# Patient Record
Sex: Male | Born: 1940
Health system: Southern US, Community
[De-identification: ages and names within clinical notes are randomized; demographics above are authoritative.]

## PROBLEM LIST (undated history)

## (undated) DIAGNOSIS — J449 Chronic obstructive pulmonary disease, unspecified: Secondary | ICD-10-CM

## (undated) DIAGNOSIS — M1990 Unspecified osteoarthritis, unspecified site: Secondary | ICD-10-CM

## (undated) DIAGNOSIS — Z8546 Personal history of malignant neoplasm of prostate: Secondary | ICD-10-CM

## (undated) DIAGNOSIS — H269 Unspecified cataract: Secondary | ICD-10-CM

## (undated) DIAGNOSIS — R350 Frequency of micturition: Secondary | ICD-10-CM

## (undated) DIAGNOSIS — Z87898 Personal history of other specified conditions: Secondary | ICD-10-CM

## (undated) DIAGNOSIS — C61 Malignant neoplasm of prostate: Secondary | ICD-10-CM

## (undated) DIAGNOSIS — T8859XA Other complications of anesthesia, initial encounter: Secondary | ICD-10-CM

## (undated) DIAGNOSIS — Z9889 Other specified postprocedural states: Secondary | ICD-10-CM

## (undated) DIAGNOSIS — R351 Nocturia: Secondary | ICD-10-CM

## (undated) DIAGNOSIS — N320 Bladder-neck obstruction: Secondary | ICD-10-CM

## (undated) DIAGNOSIS — T4145XA Adverse effect of unspecified anesthetic, initial encounter: Secondary | ICD-10-CM

## (undated) DIAGNOSIS — N486 Induration penis plastica: Secondary | ICD-10-CM

## (undated) DIAGNOSIS — R112 Nausea with vomiting, unspecified: Secondary | ICD-10-CM

## (undated) DIAGNOSIS — G4733 Obstructive sleep apnea (adult) (pediatric): Secondary | ICD-10-CM

## (undated) DIAGNOSIS — Z87448 Personal history of other diseases of urinary system: Secondary | ICD-10-CM

## (undated) DIAGNOSIS — I10 Essential (primary) hypertension: Secondary | ICD-10-CM

## (undated) HISTORY — PX: EYE SURGERY: SHX253

## (undated) HISTORY — DX: Malignant neoplasm of prostate: C61

## (undated) HISTORY — DX: Essential (primary) hypertension: I10

## (undated) HISTORY — DX: Unspecified cataract: H26.9

## (undated) HISTORY — PX: CATARACT EXTRACTION W/ INTRAOCULAR LENS  IMPLANT, BILATERAL: SHX1307

## (undated) HISTORY — DX: Unspecified osteoarthritis, unspecified site: M19.90

---

## 2001-10-03 HISTORY — PX: OTHER SURGICAL HISTORY: SHX169

## 2002-02-19 ENCOUNTER — Ambulatory Visit (HOSPITAL_COMMUNITY): Admission: RE | Admit: 2002-02-19 | Discharge: 2002-02-19 | Payer: Self-pay | Admitting: Urology

## 2002-03-29 ENCOUNTER — Encounter: Admission: RE | Admit: 2002-03-29 | Discharge: 2002-03-29 | Payer: Self-pay | Admitting: Urology

## 2002-03-29 ENCOUNTER — Encounter: Payer: Self-pay | Admitting: Urology

## 2002-04-09 ENCOUNTER — Ambulatory Visit: Admission: RE | Admit: 2002-04-09 | Discharge: 2002-07-30 | Payer: Self-pay | Admitting: *Deleted

## 2002-05-21 ENCOUNTER — Encounter: Payer: Self-pay | Admitting: Urology

## 2002-05-21 ENCOUNTER — Encounter: Admission: RE | Admit: 2002-05-21 | Discharge: 2002-05-21 | Payer: Self-pay | Admitting: Urology

## 2002-06-20 ENCOUNTER — Ambulatory Visit (HOSPITAL_BASED_OUTPATIENT_CLINIC_OR_DEPARTMENT_OTHER): Admission: RE | Admit: 2002-06-20 | Discharge: 2002-06-20 | Payer: Self-pay | Admitting: Urology

## 2002-06-20 ENCOUNTER — Encounter: Payer: Self-pay | Admitting: Urology

## 2002-06-20 HISTORY — PX: OTHER SURGICAL HISTORY: SHX169

## 2002-07-10 ENCOUNTER — Ambulatory Visit: Admission: RE | Admit: 2002-07-10 | Discharge: 2002-07-30 | Payer: Self-pay | Admitting: *Deleted

## 2004-03-10 ENCOUNTER — Ambulatory Visit (HOSPITAL_COMMUNITY): Admission: RE | Admit: 2004-03-10 | Discharge: 2004-03-10 | Payer: Self-pay | Admitting: Urology

## 2008-04-21 ENCOUNTER — Ambulatory Visit (HOSPITAL_COMMUNITY): Admission: EM | Admit: 2008-04-21 | Discharge: 2008-04-21 | Payer: Self-pay | Admitting: Emergency Medicine

## 2008-04-21 HISTORY — PX: OTHER SURGICAL HISTORY: SHX169

## 2010-10-28 ENCOUNTER — Ambulatory Visit
Admission: RE | Admit: 2010-10-28 | Discharge: 2010-10-28 | Payer: Self-pay | Source: Home / Self Care | Attending: Urology | Admitting: Urology

## 2010-10-28 HISTORY — PX: OTHER SURGICAL HISTORY: SHX169

## 2010-10-28 LAB — POCT HEMOGLOBIN-HEMACUE: Hemoglobin: 14.1 g/dL (ref 13.0–17.0)

## 2010-11-29 NOTE — Op Note (Signed)
  NAME:  Steve [name], Steve [name]                ACCOUNT NO.:  [id number]  MEDICAL RECORD NO.:  [id number]          PATIENT TYPE:  AMB  LOCATION:  N[facility]                         FACILITY:  [facility]  PHYSICIAN:  Kamani Lewter I. Patsi Sears, M.D.DATE OF BIRTH:  Nov 23, 1940  DATE OF PROCEDURE: DATE OF DISCHARGE:                              OPERATIVE REPORT   FINAL DIAGNOSES: 1. Dense urethral stricture disease. 2. Adenocarcinoma of the prostate post iodine seed implantation.  OPERATIONS:  Cystourethroscopy, urethral dilation, balloon dilation of the urethra with Cook balloon dilator, Gyrus vaporization of prostate and stricture.  SURGEON:  Mercy Malena I. Patsi Sears, M.D.  ANESTHESIA:  General LMA.  PREPARATION:  After appropriate preanesthesia, the patient was brought to the operating room, placed on the operating room in dorsal supine position where general LMA anesthesia was introduced.  He was then replaced in dorsal lithotomy position where the pubis was prepped with Betadine solution and draped in usual fashion.  REVIEW OF HISTORY:  This 70 year old male is status post prostate seed implantation for adenocarcinoma of the prostate in 2003.  He had bladder neck contracture release by Dr. Vonita Moss in 2009.  The patient now notes that he has a very slow urinary stream and there is no force of the stream.  The patient had cystoscopy in the office, but dense stricture was identified and could not be dilated in the office.  He is now for dilation.  DESCRIPTION OF PROCEDURE:  Cystourethroscopy reveals a tight narrowing of the prostatic urethra.  There appears to be complete scar of the urethra, but a small dimple was identified.  I have probed this dimple with a floppy-tip wire but could not get the wire to go into the bladder.  Retrograde urethrogram was then accomplished and shows the bladder and some normalcy of proximal urethra.  Therefore, I dilated the urethra with the [employer] sounds from a size  12-French to a size 18- [place], re-cystoscoped the patient, repassed the guidewire into the bladder.  I was then able to dilate the patient to a size 26-French and then passed the Gyrus scope into the bladder with difficulty.  The patient had several areas of scar and these were vaporized with the Gyrus button electrode.  This also required repeat vaporization of the patient's bladder neck, on the right side, from the 7 o'clock to the 5 o'clock positions.  Following this, no bleeding was noted.  A guidewire was passed in the bladder, and a size 20 Council catheter was passed into the bladder and 10 mL were placed in the balloon.  The patient was awakened and taken to recovery room in good condition.     [name] I. Patsi Sears, M.D.     SIT/MEDQ  D:  10/28/2010  T:  10/28/2010  Job:  [id number]  Electronically Signed by Jethro Bolus M.D. on 11/29/2010 09:33:01 AM

## 2011-02-15 NOTE — Consult Note (Signed)
NAMECABOT, CROMARTIE NO.:  [id number]   MEDICAL RECORD NO.:  [id number]          PATIENT TYPE:  INP   LOCATION:  0113                         FACILITY:  [facility]   PHYSICIAN:  Maretta Bees. Vonita Moss, M.D.DATE OF BIRTH:  1940-11-27   DATE OF CONSULTATION:  04/21/2008  DATE OF DISCHARGE:                                 CONSULTATION   I was asked to see this 70 year old white male by the  [facility]  Emergency Room staff when he presented there in urinary retention, and  the staff could not put and a Foley catheter and they even tried a 14-  [place] Coude.  He has a history of a weak stream.  He has had a history  of radioactive seed implantation of the prostate by Dr. Patsi Sears in  2003.  His PSA is a bit low.   He also has been treated by Dr. Patsi Sears for osteopenia with vitamin D  and Actonel.   PAST HISTORY:  He is in generally good health.  He denies strokes, heart  attacks, or other serious illnesses such as diabetes.   MEDICATIONS:  1. Actonel.  2. High doses of vitamin D   ALLERGIES:  None.   TOBACCO:  None.   ALCOHOL:  None.   FAMILY HISTORY:  Noncontributory.   REVIEW OF SYSTEMS:  No headache, dizziness, chills, fever, chest pain,  or cough.  He has lower abdominal pain from inability to void.  He  denies any peripheral edema.   PHYSICAL EXAMINATION:  VITAL SIGNS:  As recorded at 146/85 blood  pressure, with a pulse of 70, temperature 98.5.  GENERAL:  He is alert and oriented.  Skin is warm and dry.  He is in  distress.  HEART:  Tones regular.  CHEST:  Clear.  ABDOMEN:  Reveals no hernias or hepatosplenomegaly, but he has a  distended, tender suprapubic region consistent with urinary retention.  GU:  Penis, urethral meatus, testicles, and epididymis remarkable for  some blood per urethral meatus.  Perineum is normal.  RECTAL:  Anal sphincter tone is normal.  Prostate feels small.   I attempted to pass a Foley catheter, but it would not go very  easily,  and I tried to pass a guidewire, but again it did not go easily, and he  had enough bleeding from his prior instrumentation that I felt that it  would be best to take him to the operating room for further evaluation,  since he had been n.p.o. and I was concerned about good visualization  with blood per urethra and trying and do that in the ED.   IMPRESSION:  1. Urinary retention.  2. Urethral stricture versus bladder neck contracture.  3. Osteopenia.      Maretta Bees. Vonita Moss, M.D.  Electronically Signed     LJP/MEDQ  D:  04/21/2008  T:  04/22/2008  Job:  [id number]

## 2011-02-15 NOTE — Op Note (Signed)
NAMECALVYN, KURTZMAN NO.:  [id number]   MEDICAL RECORD NO.:  [id number]          PATIENT TYPE:  INP   LOCATION:  0113                         FACILITY:  [facility]   PHYSICIAN:  Maretta Bees. Vonita Moss, M.D.DATE OF BIRTH:  1941/01/07   DATE OF PROCEDURE:  04/21/2008  DATE OF DISCHARGE:                               OPERATIVE REPORT   PREOPERATIVE DIAGNOSIS:  Urinary retention due to urethral stricture or  bladder neck contracture.   POSTOPERATIVE DIAGNOSIS:  Urinary retention due to tight, deep, bulbous  urethral stricture.   PROCEDURE:  Cystoscopy, urethral dilation with filiforms  and followers  and placement of Foley catheter.   SURGEON:  Dr. Larey Dresser.   ANESTHESIA:  General.   INDICATIONS:  This gentleman presented to the emergency room with  urinary retention and a catheter could not be passed.  As noted in my  H&P, I brought him to the OR for further evaluation.   PROCEDURE:  The patient brought to the operating room, placed in  lithotomy position.  The external genitalia were prepped and draped in  usual fashion.  The anterior urethra was normal except for a very tiny  opening through a deep bulbous urethral stricture.  A flexible guidewire  was placed through this opening and passed easily into the bladder.  Over this guidewire I dilated a fairly filmy deep bulbous urethral  stricture to 28-French.  I then reinserted the cystoscope and verified  the presence of a deep bulbous urethral stricture.  The prostate was  small, short and really nonobstructing with an open bladder neck.  The  bladder itself had no lesions.  The bladder having been drained of his  retained urine, was then drained further with placement of a 20-French 5  mL Foley.  The patient tolerated procedure well and was sent to the  recovery room in good condition with negligible blood loss and Foley  catheter draining nicely.   I will send him home tonight on Cipro and have him remove  his Foley  catheter in the morning, return to see Dr. Patsi Sears next week in  follow-up or call us earlier p.r.n.      Maretta Bees. Vonita Moss, M.D.  Electronically Signed     LJP/MEDQ  D:  04/21/2008  T:  04/22/2008  Job:  [id number]

## 2011-02-18 NOTE — Op Note (Signed)
[facility]  Patient:    AITHAN, FARRELLY Visit Number: [id number] MRN: [id number]          Service Type: Attending:  Vonzell Schlatter. Patsi Sears, M.D. Dictated by:   Vonzell Schlatter Patsi Sears, M.D. Proc. Date: 02/19/02                             Operative Report  PREOPERATIVE DIAGNOSIS:  Erectile dysfunction.  POSTOPERATIVE DIAGNOSIS:  Erectile dysfunction.  PROCEDURE:  Duplex Doppler ultrasonography with prostaglandin injection.  SURGEON:  Sigmund I. Patsi Sears, M.D.  DESCRIPTION OF PROCEDURE:  With the patient in the supine position, resting preinjection cavernosal arterial flow was measured.  The patient has a 49.3 cc/sec. flow in the right proximal cavernosal artery, with an end-diastolic flow of 2.4 cm/sec.  He has a resting preinjection flow of 23.4 cm/sec. in the left peak systolic flow and an end-diastolic flow of 1.2 cm/sec.  Distalward, the patient has a 32.2 cm/sec. peak systolic flow and a 3.7 cm/sec. end-diastolic flow on the right, and a 15 cm/sec. distal left flow and a 2.2 cm/sec. end-diastolic flow.  Following the injection, the patient increases the proximal right flow to 127.4 cm/sec. with a 7.5 cm end-diastolic flow, at the same time has a dsital gradient, with a 65.2 cm/sec. flow and a 25.5 cm/sec. end-diastolic flow (venous leak).  The patient continues to have this enormous gradient on the right side secondary to Peyronies disease for the entire study, and at 30 minutes has a peak systolic flow on the right side of 166 cm/sec. and a distal end-diastolic flow of 10 cc/sec. and distal flow of 78 cm/sec. with an end-diastolic flow of 2.5 cm/sec.  On the left side, the patient increases his peak systolic flow from the 23.9 cm/sec. peak flow, with a distal flow of 15 cc/sec. peak systolic, and after 1 cc prostaglandin injection 20 mcg/cc., the patient increases his peak systolic flow on the left to a maximum of 57.9 cm/sec., with an  end-diastolic flow of 3.2 cm/sec., and a distal gradient with a 37.5 cc/sec. flow and a 15 cc/sec. end-diastolic flow.  Interestingly, at 30 minutes the patient has a 54 cm/sec. peak diastolic flow on the left proximally and an 82 cm/sec. peak systolic flow distalward.  This would indicate that the patient is achieving blood flow from the right side across to the left side distalward.  IMPRESSION:  The patient has significant Perynonies plaque, which is easily palpated.  He complains of severe penile curvature.  Duplex Doppler ultrasound scanning shows a large gradient, even at rest without injection but the gradient is vastly demonstrated with 1 cc of prostaglandin injection. Dictated by:   Vonzell Schlatter Patsi Sears, M.D. Attending:  Vonzell Schlatter. Patsi Sears, M.D. DD:  02/19/02 TD:  02/21/02 Job: [id number]

## 2011-02-18 NOTE — Op Note (Signed)
   NAME:  Steve [name], Steve [name]                            ACCOUNT NO.:  [id number]   MEDICAL RECORD NO.:  [id number]                   PATIENT TYPE:  AMB   LOCATION:  [facility]C                                 FACILITY:  [facility]   PHYSICIAN:  Sigmund I. Patsi Sears, M.D.         DATE OF BIRTH:  [birth year]   DATE OF PROCEDURE:  06/20/2002  DATE OF DISCHARGE:                                 OPERATIVE REPORT   PREOPERATIVE DIAGNOSES:  Adenocarcinoma of the prostate.   POSTOPERATIVE DIAGNOSES:  Adenocarcinoma of the prostate.   OPERATION:  Radioactive iodine seed implantation (67 seeds).   PREPARATION:  After appropriate preanesthesia, the patient was brought to  the operating room, placed on the operating table in dorsal supine position  where general LMA anesthesia was introduced. He was replaced in dorsal  lithotomy position where the pubis was prepped with Betadine solution and  draped in the usual fashion.   REVIEW OF HISTORY:  This 70 year old married white male, was originally  evaluated for erectile dysfunction of one year duration, secondary to the  Peyronie's disease. He was found to have atrophic left testicle secondary to  mumps orchitis in the past. His PSA was 5.1, and prostate and rectal  examination showed a 3+ lobular benign prostate. His prostate volume was 32  cc, and prostate biopsy showed Gleason 3+4 (7) adenocarcinoma of the  prostate. He had a negative CT and bone scan, selective radioactive seed  implantation is treatment for his prostate cancer.   DESCRIPTION OF PROCEDURE:  The patient undergoes insertion of I-125 seeds  under fluoroscopic control with Dr. Jackelyn Knife, with total number of 67 seeds  implanted. He did quite well, flexible cystoscopy shows normal appearing  bladder, and normal appearing urethra, with some mild erythema of the  urethra. The veru was normal and  the bladder neck was normal. The cystoscope was withdrawn and #16 Foley  catheter placed. The patient  tolerated the procedure well and was given a  B&O suppository at the end of the case, and given 30 mg of IV Toradol,  awakened and taken to the recovery room in good condition.                                               Sigmund I. Patsi Sears, M.D.    SIT/MEDQ  D:  06/20/2002  T:  06/20/2002  Job:  [id number]   cc:   Elmer Sow. Dorna Bloom, M.D.  [address] [facility]  [place]  [place]  [address]  Fax: [phone]   Donzetta Sprung  [address], [address]  [place]  [place] [address]  Fax: [phone]

## 2011-07-01 LAB — DIFFERENTIAL
Basophils Absolute: 0
Basophils Relative: 0
Eosinophils Absolute: 0
Eosinophils Relative: 0
Lymphocytes Relative: 5 — ABNORMAL LOW
Lymphs Abs: 0.5 — ABNORMAL LOW
Monocytes Absolute: 0.4
Monocytes Relative: 4
Neutro Abs: 8.9 — ABNORMAL HIGH
Neutrophils Relative %: 90 — ABNORMAL HIGH

## 2011-07-01 LAB — COMPREHENSIVE METABOLIC PANEL WITH GFR
BUN: 21
CO2: 25
Calcium: 9.1
GFR calc non Af Amer: 50 — ABNORMAL LOW
Glucose, Bld: 112 — ABNORMAL HIGH
Total Protein: 6.6

## 2011-07-01 LAB — POCT I-STAT, CHEM 8
BUN: 23
Calcium, Ion: 1.19
Chloride: 108
Creatinine, Ser: 1.5
Glucose, Bld: 104 — ABNORMAL HIGH
HCT: 38 — ABNORMAL LOW
Hemoglobin: 12.9 — ABNORMAL LOW
Potassium: 3.9
Sodium: 140
TCO2: 24

## 2011-07-01 LAB — CBC
HCT: 39
Hemoglobin: 13.6
MCHC: 34.8
MCV: 88.6
Platelets: 146 — ABNORMAL LOW
RBC: 4.4
RDW: 13.5
WBC: 9.8

## 2011-07-01 LAB — COMPREHENSIVE METABOLIC PANEL
ALT: 14
AST: 19
Albumin: 3.5
Alkaline Phosphatase: 100
Chloride: 109
Creatinine, Ser: 1.4
GFR calc Af Amer: >60
Potassium: 3.9
Sodium: 141
Total Bilirubin: 1

## 2012-01-16 DIAGNOSIS — C61 Malignant neoplasm of prostate: Secondary | ICD-10-CM | POA: Diagnosis not present

## 2012-01-16 DIAGNOSIS — N320 Bladder-neck obstruction: Secondary | ICD-10-CM | POA: Diagnosis not present

## 2012-01-17 ENCOUNTER — Other Ambulatory Visit: Payer: Self-pay | Admitting: Urology

## 2012-01-24 ENCOUNTER — Encounter (HOSPITAL_BASED_OUTPATIENT_CLINIC_OR_DEPARTMENT_OTHER): Payer: Self-pay | Admitting: *Deleted

## 2012-01-25 ENCOUNTER — Encounter (HOSPITAL_BASED_OUTPATIENT_CLINIC_OR_DEPARTMENT_OTHER): Payer: Self-pay | Admitting: *Deleted

## 2012-01-25 NOTE — Progress Notes (Signed)
NPO AFTER MN. ARRIVES AT 0845. NEEDS HG AND EKG. LAST CXR W/ CHART.

## 2012-01-27 ENCOUNTER — Encounter (HOSPITAL_BASED_OUTPATIENT_CLINIC_OR_DEPARTMENT_OTHER)
Admission: RE | Disposition: A | Discharge status: Home / Self Care | Payer: Self-pay | Source: Ambulatory Visit | Attending: Urology

## 2012-01-27 ENCOUNTER — Encounter (HOSPITAL_BASED_OUTPATIENT_CLINIC_OR_DEPARTMENT_OTHER): Payer: Self-pay | Admitting: Anesthesiology

## 2012-01-27 ENCOUNTER — Ambulatory Visit (HOSPITAL_BASED_OUTPATIENT_CLINIC_OR_DEPARTMENT_OTHER): Payer: Medicare Other | Admitting: Anesthesiology

## 2012-01-27 ENCOUNTER — Encounter (HOSPITAL_BASED_OUTPATIENT_CLINIC_OR_DEPARTMENT_OTHER): Payer: Self-pay

## 2012-01-27 ENCOUNTER — Ambulatory Visit (HOSPITAL_BASED_OUTPATIENT_CLINIC_OR_DEPARTMENT_OTHER)
Admission: RE | Admit: 2012-01-27 | Discharge: 2012-01-27 | Disposition: A | Discharge status: Home / Self Care | Payer: Medicare Other | Source: Ambulatory Visit | Attending: Urology | Admitting: Urology

## 2012-01-27 DIAGNOSIS — N35919 Unspecified urethral stricture, male, unspecified site: Secondary | ICD-10-CM | POA: Insufficient documentation

## 2012-01-27 DIAGNOSIS — E559 Vitamin D deficiency, unspecified: Secondary | ICD-10-CM | POA: Diagnosis not present

## 2012-01-27 DIAGNOSIS — G4730 Sleep apnea, unspecified: Secondary | ICD-10-CM | POA: Diagnosis not present

## 2012-01-27 DIAGNOSIS — N200 Calculus of kidney: Secondary | ICD-10-CM | POA: Diagnosis not present

## 2012-01-27 DIAGNOSIS — N320 Bladder-neck obstruction: Secondary | ICD-10-CM | POA: Diagnosis not present

## 2012-01-27 DIAGNOSIS — C61 Malignant neoplasm of prostate: Secondary | ICD-10-CM | POA: Insufficient documentation

## 2012-01-27 DIAGNOSIS — M899 Disorder of bone, unspecified: Secondary | ICD-10-CM | POA: Insufficient documentation

## 2012-01-27 DIAGNOSIS — R351 Nocturia: Secondary | ICD-10-CM | POA: Insufficient documentation

## 2012-01-27 HISTORY — DX: Induration penis plastica: N48.6

## 2012-01-27 HISTORY — DX: Personal history of other specified conditions: Z87.898

## 2012-01-27 HISTORY — DX: Obstructive sleep apnea (adult) (pediatric): G47.33

## 2012-01-27 HISTORY — PX: CYSTOSCOPY: SHX5120

## 2012-01-27 HISTORY — DX: Personal history of malignant neoplasm of prostate: Z85.46

## 2012-01-27 HISTORY — DX: Bladder-neck obstruction: N32.0

## 2012-01-27 HISTORY — DX: Nocturia: R35.1

## 2012-01-27 HISTORY — DX: Frequency of micturition: R35.0

## 2012-01-27 HISTORY — DX: Chronic obstructive pulmonary disease, unspecified: J44.9

## 2012-01-27 HISTORY — DX: Personal history of other diseases of urinary system: Z87.448

## 2012-01-27 SURGERY — CYSTOSCOPY
Anesthesia: General | Site: Bladder | Wound class: Clean Contaminated

## 2012-01-27 MED ORDER — HYDROCODONE-ACETAMINOPHEN 7.5-650 MG PO TABS: 1.0000 | ORAL_TABLET | Freq: Four times a day (QID) | ORAL | Status: AC | PRN

## 2012-01-27 MED ORDER — MIDAZOLAM HCL 5 MG/5ML IJ SOLN
INTRAMUSCULAR | Status: DC | PRN
  Administered 2012-01-27: 2 mg via INTRAVENOUS

## 2012-01-27 MED ORDER — DEXAMETHASONE SODIUM PHOSPHATE 4 MG/ML IJ SOLN
INTRAMUSCULAR | Status: DC | PRN
  Administered 2012-01-27: 4 mg via INTRAVENOUS

## 2012-01-27 MED ORDER — TRIMETHOPRIM 100 MG PO TABS: 100.0000 mg | ORAL_TABLET | ORAL | Status: AC

## 2012-01-27 MED ORDER — PHENAZOPYRIDINE HCL 200 MG PO TABS: 200.0000 mg | ORAL_TABLET | Freq: Three times a day (TID) | ORAL | Status: AC | PRN

## 2012-01-27 MED ORDER — PROMETHAZINE HCL 25 MG/ML IJ SOLN: 6.2500 mg | INTRAMUSCULAR | Status: DC | PRN

## 2012-01-27 MED ORDER — CEFAZOLIN SODIUM 1-5 GM-% IV SOLN
1.0000 g | INTRAVENOUS | Status: AC
  Administered 2012-01-27: 1 g via INTRAVENOUS

## 2012-01-27 MED ORDER — FENTANYL CITRATE 0.05 MG/ML IJ SOLN: 25.0000 ug | INTRAMUSCULAR | Status: DC | PRN

## 2012-01-27 MED ORDER — ONDANSETRON HCL 4 MG/2ML IJ SOLN
INTRAMUSCULAR | Status: DC | PRN
  Administered 2012-01-27: 4 mg via INTRAVENOUS

## 2012-01-27 MED ORDER — LACTATED RINGERS IV SOLN: INTRAVENOUS | Status: DC

## 2012-01-27 MED ORDER — PROPOFOL 10 MG/ML IV EMUL
INTRAVENOUS | Status: DC | PRN
  Administered 2012-01-27: 180 mg via INTRAVENOUS

## 2012-01-27 MED ORDER — LIDOCAINE HCL (CARDIAC) 20 MG/ML IV SOLN
INTRAVENOUS | Status: DC | PRN
  Administered 2012-01-27: 60 mg via INTRAVENOUS

## 2012-01-27 MED ORDER — LACTATED RINGERS IV SOLN
INTRAVENOUS | Status: DC
  Administered 2012-01-27 (×2): via INTRAVENOUS

## 2012-01-27 MED ORDER — FENTANYL CITRATE 0.05 MG/ML IJ SOLN
INTRAMUSCULAR | Status: DC | PRN
  Administered 2012-01-27 (×3): 50 ug via INTRAVENOUS

## 2012-01-27 MED ORDER — BELLADONNA ALKALOIDS-OPIUM 16.2-60 MG RE SUPP
RECTAL | Status: DC | PRN
  Administered 2012-01-27: 1 via RECTAL

## 2012-01-27 SURGICAL SUPPLY — 28 items
BAG DRAIN URO-CYSTO SKYTR STRL (DRAIN) ×2 IMPLANT
BAG DRN UROCATH (DRAIN) ×1
BAG URINE LEG 500ML (DRAIN) ×1 IMPLANT
BALLN NEPHROSTOMY (BALLOONS) ×2
BALLOON NEPHROSTOMY (BALLOONS) IMPLANT
BOOTIES KNEE HIGH SLOAN (MISCELLANEOUS) ×1 IMPLANT
CANISTER SUCT LVC 12 LTR MEDI- (MISCELLANEOUS) ×1 IMPLANT
CATH FOLEY 2WAY SLVR 30CC 22FR (CATHETERS) ×1 IMPLANT
CLOTH BEACON ORANGE TIMEOUT ST (SAFETY) ×2 IMPLANT
DRAPE CAMERA CLOSED 9X96 (DRAPES) ×2 IMPLANT
ELECT REM PT RETURN 9FT ADLT (ELECTROSURGICAL) ×2
ELECT RESECT VAPORIZE 12D CBL (ELECTRODE) ×1 IMPLANT
ELECTRODE REM PT RTRN 9FT ADLT (ELECTROSURGICAL) ×1 IMPLANT
GLOVE BIO SURGEON STRL SZ7.5 (GLOVE) ×2 IMPLANT
GOWN PREVENTION PLUS LG XLONG (DISPOSABLE) ×2 IMPLANT
GOWN STRL REIN XL XLG (GOWN DISPOSABLE) ×2 IMPLANT
GUIDEWIRE STR DUAL SENSOR (WIRE) ×1 IMPLANT
IV NS IRRIG 3000ML ARTHROMATIC (IV SOLUTION) ×2 IMPLANT
KIT ASPIRATION TUBING (SET/KITS/TRAYS/PACK) ×1 IMPLANT
NDL SAFETY ECLIPSE 18X1.5 (NEEDLE) IMPLANT
NDL SPNL 22GX7 QUINCKE BK (NEEDLE) IMPLANT
NEEDLE HYPO 18GX1.5 SHARP (NEEDLE)
NEEDLE HYPO 22GX1.5 SAFETY (NEEDLE) IMPLANT
NEEDLE SPNL 22GX7 QUINCKE BK (NEEDLE) IMPLANT
NS IRRIG 500ML POUR BTL (IV SOLUTION) ×1 IMPLANT
PACK CYSTOSCOPY (CUSTOM PROCEDURE TRAY) ×2 IMPLANT
SYR 20CC LL (SYRINGE) IMPLANT
SYRINGE IRR TOOMEY STRL 70CC (SYRINGE) ×1 IMPLANT

## 2012-01-27 NOTE — Transfer of Care (Signed)
Immediate Anesthesia Transfer of Care Note  Patient: Steve [name]  Procedure(s) Performed: Procedure(s) (LRB): CYSTOSCOPY (N/A)  Patient Location: Patient transported to PACU with oxygen via face mask at 4 Liters / Min  Anesthesia Type: General  Level of Consciousness: awake and alert   Airway & Oxygen Therapy: Patient Spontanous Breathing and Patient connected to face mask oxygen  Post-op Assessment: Report given to PACU RN and Post -op Vital signs reviewed and stable  Post vital signs: Reviewed and stable  Dentition: Teeth and oropharynx remain in pre-op condition  Complications: No apparent anesthesia complications

## 2012-01-27 NOTE — Anesthesia Postprocedure Evaluation (Signed)
Anesthesia Post Note  Patient: Steve [name]  Procedure(s) Performed: Procedure(s) (LRB): CYSTOSCOPY (N/A)  Anesthesia type: General  Patient location: PACU  Post pain: Pain level controlled  Post assessment: Post-op Vital signs reviewed  Last Vitals:  Filed Vitals:   01/27/12 1130  BP:   Pulse: 71  Temp:   Resp: 17    Post vital signs: Reviewed  Level of consciousness: sedated  Complications: No apparent anesthesia complications

## 2012-01-27 NOTE — Interval H&P Note (Signed)
History and Physical Interval Note:  01/27/2012 10:24 AM  Steve [name]  has presented today for surgery, with the diagnosis of bladder neck contracture  The various methods of treatment have been discussed with the patient and family. After consideration of risks, benefits and other options for treatment, the patient has consented to  Procedure(s) (LRB): CYSTOSCOPY (N/A) as a surgical intervention .  The patients' history has been reviewed, patient examined, no change in status, stable for surgery.  I have reviewed the patients' chart and labs.  Questions were answered to the patient's satisfaction.     Jethro Bolus I

## 2012-01-27 NOTE — Anesthesia Procedure Notes (Signed)
Procedure Name: LMA Insertion Date/Time: 01/27/2012 10:32 AM Performed by: Norva Pavlov Pre-anesthesia Checklist: Patient identified, Emergency Drugs available, Suction available and Patient being monitored Patient Re-evaluated:Patient Re-evaluated prior to inductionOxygen Delivery Method: Circle System Utilized Preoxygenation: Pre-oxygenation with 100% oxygen Intubation Type: IV induction Ventilation: Mask ventilation without difficulty LMA: LMA inserted LMA Size: 4.0 Number of attempts: 1 Airway Equipment and Method: bite block Placement Confirmation: positive ETCO2 Tube secured with: Tape Dental Injury: Teeth and Oropharynx as per pre-operative assessment

## 2012-01-27 NOTE — Discharge Instructions (Signed)
°  Post Anesthesia Home Care Instructions ° °Activity: °Get plenty of rest for the remainder of the day. A responsible adult should stay with you for 24 hours following the procedure.  °For the next 24 hours, DO NOT: °-Drive a car °-Operate machinery °-Drink alcoholic beverages °-Take any medication unless instructed by your physician °-Make any legal decisions or sign important papers. ° °Meals: °Start with liquid foods such as gelatin or soup. Progress to regular foods as tolerated. Avoid greasy, spicy, heavy foods. If nausea and/or vomiting occur, drink only clear liquids until the nausea and/or vomiting subsides. Call your physician if vomiting continues. ° °Special Instructions/Symptoms: °Your throat may feel dry or sore from the anesthesia or the breathing tube placed in your throat during surgery. If this causes discomfort, gargle with warm salt water. The discomfort should disappear within 24 hours. ° ° °Post Anesthesia Home Care Instructions ° °Activity: °Get plenty of rest for the remainder of the day. A responsible adult should stay with you for 24 hours following the procedure.  °For the next 24 hours, DO NOT: °-Drive a car °-Operate machinery °-Drink alcoholic beverages °-Take any medication unless instructed by your physician °-Make any legal decisions or sign important papers. ° °Meals: °Start with liquid foods such as gelatin or soup. Progress to regular foods as tolerated. Avoid greasy, spicy, heavy foods. If nausea and/or vomiting occur, drink only clear liquids until the nausea and/or vomiting subsides. Call your physician if vomiting continues. ° °Special Instructions/Symptoms: °Your throat may feel dry or sore from the anesthesia or the breathing tube placed in your throat during surgery. If this causes discomfort, gargle with warm salt water. The discomfort should disappear within 24 hours. ° °

## 2012-01-27 NOTE — Anesthesia Preprocedure Evaluation (Addendum)
Anesthesia Evaluation  Patient identified by MRN, date of birth, ID band Patient awake    Reviewed: Allergy & Precautions, H&P , NPO status , Patient's Chart, lab work & pertinent test results  Airway Mallampati: II TM Distance: >3 FB Neck ROM: Full    Dental  (+) Upper Dentures, Lower Dentures, Edentulous Upper, Edentulous Lower and Dental Advisory Given   Pulmonary sleep apnea (noncopliant with CPAP) , COPDformer smoker breath sounds clear to auscultation  Pulmonary exam normal       Cardiovascular negative cardio ROS  Rhythm:Regular Rate:Normal     Neuro/Psych negative neurological ROS  negative psych ROS   GI/Hepatic negative GI ROS, Neg liver ROS,   Endo/Other  negative endocrine ROSMorbid obesity  Renal/GU negative Renal ROS   History prostate CA; radioactive seed implants  negative genitourinary   Musculoskeletal negative musculoskeletal ROS (+)   Abdominal   Peds negative pediatric ROS (+)  Hematology negative hematology ROS (+)   Anesthesia Other Findings   Reproductive/Obstetrics negative OB ROS                          Anesthesia Physical Anesthesia Plan  ASA: II  Anesthesia Plan: General   Post-op Pain Management:    Induction: Intravenous  Airway Management Planned: LMA  Additional Equipment:   Intra-op Plan:   Post-operative Plan: Extubation in OR  Informed Consent: I have reviewed the patients History and Physical, chart, labs and discussed the procedure including the risks, benefits and alternatives for the proposed anesthesia with the patient or authorized representative who has indicated his/her understanding and acceptance.   Dental advisory given  Plan Discussed with: CRNA  Anesthesia Plan Comments:         Anesthesia Quick Evaluation

## 2012-01-27 NOTE — Op Note (Signed)
Pre-operative diagnosis : Prostate cancer with  bladder neck obstruction  Postoperative diagnosis:same  Operation: Cook basloon dilation of BNC, Gyrus TURP and removal of extruded prostate iodine seeds  Surgeon:  S. Patsi Sears, MD  First assistant: none  Anesthesia:  LMA  Preparation:After appropriate pre-anesthesia, the pt was brought to the OR and placed upon the OR table in the supine position, where General LMA anesthesia was introduced. Armband was re-checked. He was then placed in the dorsal lithotomy position and prepped in the usual fashion.   Review history:  71 yo male with hx of CaP, 2003, treated with I-125 seed Rx, and EBRT, post bladder neck contracture release 2009, and again in 2012, now with recurrent BNC for release today.   Statement of  Likelihood of Success: Excellent. TIME-OUT observed.:  Procedure: Cystoscopy reveals normal glans, and normal pendulous urethra. veru is identified. Bladder neck/prostatic urethra stricture is identified, and an 0.038 guidewire is passed through the stricture into the bladder under fluoroscopic control. The [facility] balloon dilator was the passed and , under fluoroscopic control, the bladder neck was dilated to 18 atm. For 3 minutes.    Following this, the 61F resectoscope was passed, and Gyrus vaporization of the bladder neck and proximal prostate was accomplished. No bleeding was noted, but 2 old seeds were removed and sent to radiation therapy for verification. A 31F, catheter with 30cc balloon  Was placed and the pt was awakened and taken to the recovery room in good condition.

## 2012-01-27 NOTE — H&P (Signed)
. Adenocarcinoma Of The Prostate Gland 185 2. Bladder Neck Contracture 596.0 3. Nephrolithiasis 592.0 4. Nocturia 788.43 5. Osteopenia 733.90 6. Urethral Stricture 598.9 7. Vitamin D Deficiency 268.9  History of Present Illness      71 yo married male returns today for treatment  of BNC.Marland Kitchen  Hx of prostate cancer, BNC and kidney stones.        Patient is s/p cystoscopy/Cook balloon dilation/Gyrus vaportization of prostate and stricture on 10/28/10.  He complains today that he has no sensation during the day of when he needs to void & has stress incontinence with positional changes.  He has a hx of CaP, post seed implant, 2003 wit EBRT; and bladder neck contracture (released by Dr. Vonita Moss in July 2009).  Hx of osteopenia, but no longer taking Actonel due to cost.  He is no longer taking Vit D supplement either.  07/05/11  PSA - 0.01       09/15/10 labs:  PSA - 0.02 and Vit D - 42.   Past Medical History Problems  1. History of  Adult Sleep Apnea 780.57  Surgical History Problems  1. History of  Cystoscopy For Urethral Stricture 2. History of  Cystoscopy With Fulguration 3. History of  Prostate Surgery  Current Meds 1. No Reported Medications  Allergies Medication  1. No Known Drug Allergies  Family History Problems  1. Family history of  Family Health Status Number Of Children 2 sons; 2 daughters  Social History Problems  1. Caffeine Use tea 2. Family history of  Death In The Family Father 33; lung cancer 3. Marital History - Currently Married 4. Occupation: retired 5. Tobacco Use V15.82 1 1/2 pack X 20 yrs. Denied  6. History of  Alcohol Use  Review of Systems Genitourinary, constitutional, skin, eye, otolaryngeal, hematologic/lymphatic, cardiovascular, pulmonary, endocrine, musculoskeletal, gastrointestinal, neurological and psychiatric system(s) were reviewed and pertinent findings if present are noted.  Genitourinary: incontinence.    Vitals Vital Signs  [Data Includes: Last 1 Day]  15Apr2013 02:21PM  Blood Pressure: 162 / 90 Temperature: 98.2 F Heart Rate: 67  Results/Data Urine [Data Includes: Last 1 Day]   15Apr2013  COLOR YELLOW   APPEARANCE CLEAR   SPECIFIC GRAVITY 1.025   pH 5.5   GLUCOSE NEG mg/dL  BILIRUBIN NEG   KETONE NEG mg/dL  BLOOD NEG   PROTEIN NEG mg/dL  UROBILINOGEN 0.2 mg/dL  NITRITE NEG   LEUKOCYTE ESTERASE NEG    Procedure  Procedure: Cystoscopy  Indication: Lower Urinary Tract Symptoms. Urethral Stricture. History of Urothelial Carcinoma.  Informed Consent: Risks, benefits, and potential adverse events were discussed and informed consent was obtained from the patient.  Prep: The patient was prepped with betadine.  Anesthesia:. Local anesthesia was administered intraurethrally with 2% lidocaine jelly.  Antibiotic prophylaxis: Ciprofloxacin.  Procedure Note:  Urethral meatus:. No abnormalities.  Anterior urethra: No abnormalities.  Prostatic urethra:. There was visual obstruction of the prostatic urethra. No intravesical median lobe was visualized. A bladder neck contracture was present.  Bladder: Visulization was clear. The ureteral orifices were in the normal anatomic position bilaterally and had clear efflux of urine. A systematic survey of the bladder demonstrated no bladder tumors or stones. The mucosa was smooth without abnormalities. The patient tolerated the procedure well.  Complications: None.    Assessment Assessed  1. Adenocarcinoma Of The Prostate Gland 185 2. Bladder Neck Contracture 596.0   Significant bladder neck contracture with difficulty passing scope. He will have  cysto dilation with [facility] balloon,  and vaporization of scar.   Plan Adenocarcinoma Of The Prostate Gland (185), Bladder Neck Contracture (596.0)

## 2012-01-30 ENCOUNTER — Encounter (HOSPITAL_BASED_OUTPATIENT_CLINIC_OR_DEPARTMENT_OTHER): Payer: Self-pay | Admitting: Urology

## 2012-05-08 DIAGNOSIS — C61 Malignant neoplasm of prostate: Secondary | ICD-10-CM | POA: Diagnosis not present

## 2012-05-08 DIAGNOSIS — N35919 Unspecified urethral stricture, male, unspecified site: Secondary | ICD-10-CM | POA: Diagnosis not present

## 2012-05-08 DIAGNOSIS — R32 Unspecified urinary incontinence: Secondary | ICD-10-CM | POA: Diagnosis not present

## 2012-05-08 DIAGNOSIS — N3945 Continuous leakage: Secondary | ICD-10-CM | POA: Diagnosis not present

## 2012-05-08 DIAGNOSIS — N320 Bladder-neck obstruction: Secondary | ICD-10-CM | POA: Diagnosis not present

## 2012-05-15 ENCOUNTER — Encounter (HOSPITAL_COMMUNITY): Admission: EM | Disposition: A | Discharge status: Home / Self Care | Payer: Self-pay | Source: Home / Self Care

## 2012-05-15 ENCOUNTER — Encounter (HOSPITAL_COMMUNITY): Payer: Self-pay | Admitting: Anesthesiology

## 2012-05-15 ENCOUNTER — Other Ambulatory Visit: Payer: Self-pay | Admitting: Urology

## 2012-05-15 ENCOUNTER — Ambulatory Visit (HOSPITAL_COMMUNITY)
Admission: EM | Admit: 2012-05-15 | Discharge: 2012-05-15 | Disposition: A | Discharge status: Home / Self Care | Payer: Medicare Other | Attending: Urology | Admitting: Urology

## 2012-05-15 ENCOUNTER — Encounter (HOSPITAL_COMMUNITY): Payer: Self-pay | Admitting: Emergency Medicine

## 2012-05-15 ENCOUNTER — Inpatient Hospital Stay: Admit: 2012-05-15 | Payer: Self-pay | Admitting: Urology

## 2012-05-15 ENCOUNTER — Emergency Department (HOSPITAL_COMMUNITY): Payer: Medicare Other | Admitting: Anesthesiology

## 2012-05-15 DIAGNOSIS — R339 Retention of urine, unspecified: Secondary | ICD-10-CM | POA: Diagnosis not present

## 2012-05-15 DIAGNOSIS — R351 Nocturia: Secondary | ICD-10-CM | POA: Diagnosis not present

## 2012-05-15 DIAGNOSIS — G4730 Sleep apnea, unspecified: Secondary | ICD-10-CM | POA: Diagnosis not present

## 2012-05-15 DIAGNOSIS — N320 Bladder-neck obstruction: Secondary | ICD-10-CM | POA: Diagnosis not present

## 2012-05-15 DIAGNOSIS — Z87442 Personal history of urinary calculi: Secondary | ICD-10-CM | POA: Diagnosis not present

## 2012-05-15 DIAGNOSIS — C61 Malignant neoplasm of prostate: Secondary | ICD-10-CM | POA: Diagnosis not present

## 2012-05-15 DIAGNOSIS — N35919 Unspecified urethral stricture, male, unspecified site: Secondary | ICD-10-CM | POA: Diagnosis not present

## 2012-05-15 DIAGNOSIS — Z0389 Encounter for observation for other suspected diseases and conditions ruled out: Secondary | ICD-10-CM | POA: Diagnosis not present

## 2012-05-15 DIAGNOSIS — E559 Vitamin D deficiency, unspecified: Secondary | ICD-10-CM | POA: Diagnosis not present

## 2012-05-15 DIAGNOSIS — T66XXXA Radiation sickness, unspecified, initial encounter: Secondary | ICD-10-CM | POA: Diagnosis not present

## 2012-05-15 DIAGNOSIS — N3945 Continuous leakage: Secondary | ICD-10-CM | POA: Diagnosis not present

## 2012-05-15 DIAGNOSIS — N139 Obstructive and reflux uropathy, unspecified: Secondary | ICD-10-CM | POA: Diagnosis not present

## 2012-05-15 DIAGNOSIS — M899 Disorder of bone, unspecified: Secondary | ICD-10-CM | POA: Insufficient documentation

## 2012-05-15 DIAGNOSIS — Y842 Radiological procedure and radiotherapy as the cause of abnormal reaction of the patient, or of later complication, without mention of misadventure at the time of the procedure: Secondary | ICD-10-CM | POA: Insufficient documentation

## 2012-05-15 HISTORY — PX: TRANSURETHRAL RESECTION OF BLADDER TUMOR: SHX2575

## 2012-05-15 LAB — POCT I-STAT, CHEM 8
BUN: 21 mg/dL (ref 6–23)
Calcium, Ion: 1.19 mmol/L (ref 1.13–1.30)
Creatinine, Ser: 1.4 mg/dL — ABNORMAL HIGH (ref 0.50–1.35)
TCO2: 25 mmol/L (ref 0–100)

## 2012-05-15 SURGERY — TURBT (TRANSURETHRAL RESECTION OF BLADDER TUMOR)
Anesthesia: General | Site: Bladder | Wound class: Clean Contaminated

## 2012-05-15 MED ORDER — LABETALOL HCL 5 MG/ML IV SOLN
INTRAVENOUS | Status: DC | PRN
  Administered 2012-05-15: 5 mg via INTRAVENOUS

## 2012-05-15 MED ORDER — DEXAMETHASONE SODIUM PHOSPHATE 10 MG/ML IJ SOLN
INTRAMUSCULAR | Status: DC | PRN
  Administered 2012-05-15: 10 mg via INTRAVENOUS

## 2012-05-15 MED ORDER — PROPOFOL 10 MG/ML IV EMUL
INTRAVENOUS | Status: DC | PRN
  Administered 2012-05-15: 180 mg via INTRAVENOUS

## 2012-05-15 MED ORDER — BELLADONNA ALKALOIDS-OPIUM 16.2-60 MG RE SUPP
RECTAL | Status: DC | PRN
  Administered 2012-05-15: 1 via RECTAL

## 2012-05-15 MED ORDER — 0.9 % SODIUM CHLORIDE (POUR BTL) OPTIME
TOPICAL | Status: DC | PRN
  Administered 2012-05-15: 1000 mL

## 2012-05-15 MED ORDER — OXYBUTYNIN CHLORIDE ER 5 MG PO TB24: 5.0000 mg | ORAL_TABLET | Freq: Every day | ORAL | Status: DC

## 2012-05-15 MED ORDER — CEFAZOLIN SODIUM-DEXTROSE 2-3 GM-% IV SOLR
INTRAVENOUS | Status: DC | PRN
  Administered 2012-05-15: 2 g via INTRAVENOUS

## 2012-05-15 MED ORDER — PROMETHAZINE HCL 25 MG/ML IJ SOLN: 6.2500 mg | INTRAMUSCULAR | Status: DC | PRN

## 2012-05-15 MED ORDER — CIPROFLOXACIN HCL 500 MG PO TABS: 500.0000 mg | ORAL_TABLET | Freq: Two times a day (BID) | ORAL | Status: AC

## 2012-05-15 MED ORDER — ONDANSETRON HCL 4 MG/2ML IJ SOLN
INTRAMUSCULAR | Status: DC | PRN
  Administered 2012-05-15: 4 mg via INTRAVENOUS

## 2012-05-15 MED ORDER — SUCCINYLCHOLINE CHLORIDE 20 MG/ML IJ SOLN
INTRAMUSCULAR | Status: DC | PRN
  Administered 2012-05-15: 100 mg via INTRAVENOUS

## 2012-05-15 MED ORDER — BELLADONNA ALKALOIDS-OPIUM 16.2-60 MG RE SUPP
RECTAL | Status: AC
  Filled 2012-05-15: qty 1

## 2012-05-15 MED ORDER — LIDOCAINE HCL 2 % EX GEL
CUTANEOUS | Status: AC
  Filled 2012-05-15: qty 10

## 2012-05-15 MED ORDER — FENTANYL CITRATE 0.05 MG/ML IJ SOLN
INTRAMUSCULAR | Status: DC | PRN
  Administered 2012-05-15: 100 ug via INTRAVENOUS
  Administered 2012-05-15: 50 ug via INTRAVENOUS

## 2012-05-15 MED ORDER — LIDOCAINE HCL 1 % IJ SOLN
INTRAMUSCULAR | Status: DC | PRN
  Administered 2012-05-15: 50 mg via INTRADERMAL

## 2012-05-15 MED ORDER — PHENAZOPYRIDINE HCL 200 MG PO TABS: 200.0000 mg | ORAL_TABLET | Freq: Three times a day (TID) | ORAL | Status: AC | PRN

## 2012-05-15 MED ORDER — SODIUM CHLORIDE 0.9 % IR SOLN
Status: DC | PRN
  Administered 2012-05-15: 6000 mL

## 2012-05-15 MED ORDER — TAPENTADOL HCL 100 MG PO TABS: 100.0000 mg | ORAL_TABLET | Freq: Four times a day (QID) | ORAL | Status: DC | PRN

## 2012-05-15 MED ORDER — LIDOCAINE HCL 4 % MT SOLN
OROMUCOSAL | Status: DC | PRN
  Administered 2012-05-15: 4 mL via TOPICAL

## 2012-05-15 MED ORDER — HYDROMORPHONE HCL PF 1 MG/ML IJ SOLN: 0.2500 mg | INTRAMUSCULAR | Status: DC | PRN

## 2012-05-15 MED ORDER — CEFAZOLIN SODIUM-DEXTROSE 2-3 GM-% IV SOLR
INTRAVENOUS | Status: AC
  Filled 2012-05-15: qty 50

## 2012-05-15 MED ORDER — LACTATED RINGERS IV SOLN
INTRAVENOUS | Status: DC | PRN
  Administered 2012-05-15 (×2): via INTRAVENOUS

## 2012-05-15 SURGICAL SUPPLY — 23 items
BAG URINE DRAINAGE (UROLOGICAL SUPPLIES) ×2 IMPLANT
BAG URO CATCHER STRL LF (DRAPE) ×3 IMPLANT
CATH SILASTIC FOLEY 18FRX5CC (CATHETERS) ×2 IMPLANT
CLOTH BEACON ORANGE TIMEOUT ST (SAFETY) ×3 IMPLANT
DRAPE CAMERA CLOSED 9X96 (DRAPES) ×3 IMPLANT
ELECT LOOP MED HF 24F 12D (CUTTING LOOP) ×2 IMPLANT
ELECT REM PT RETURN 9FT ADLT (ELECTROSURGICAL)
ELECT RESECT VAPORIZE 12D CBL (ELECTRODE) ×2 IMPLANT
ELECTRODE REM PT RTRN 9FT ADLT (ELECTROSURGICAL) ×1 IMPLANT
GLOVE BIOGEL M STRL SZ7.5 (GLOVE) ×3 IMPLANT
GOWN STRL NON-REIN LRG LVL3 (GOWN DISPOSABLE) ×1 IMPLANT
GOWN STRL REIN XL XLG (GOWN DISPOSABLE) ×5 IMPLANT
HOLDER FOLEY CATH W/STRAP (MISCELLANEOUS) IMPLANT
IV NS IRRIG 3000ML ARTHROMATIC (IV SOLUTION) ×4 IMPLANT
KIT ASPIRATION TUBING (SET/KITS/TRAYS/PACK) ×3 IMPLANT
LOOPS RESECTOSCOPE DISP (ELECTROSURGICAL) ×1 IMPLANT
MANIFOLD NEPTUNE II (INSTRUMENTS) ×3 IMPLANT
NS IRRIG 1000ML POUR BTL (IV SOLUTION) ×5 IMPLANT
PACK CYSTO (CUSTOM PROCEDURE TRAY) ×3 IMPLANT
SCRUB PCMX 4 OZ (MISCELLANEOUS) ×3 IMPLANT
SYRINGE IRR TOOMEY STRL 70CC (SYRINGE) ×2 IMPLANT
TUBING CONNECTING 10 (TUBING) ×3 IMPLANT
WATER STERILE IRR 500ML POUR (IV SOLUTION) ×2 IMPLANT

## 2012-05-15 NOTE — ED Notes (Signed)
C/o "can't urinate" went to see dr. Patsi Sears today for urinary retention- has not been able to urinate since.

## 2012-05-15 NOTE — Op Note (Signed)
Pre-operative diagnosis : Prostate cancer, radiation induced bladder neck contracture.  Postoperative diagnosis: Same  Operation: Cystourethroscopy, gyrus transurethral button electrode retrograde and antegrade vaporization of prostatic bladder neck contracture.  Surgeon:  Kathie Rhodes. Patsi Sears, MD  First assistant: None  Anesthesia:  general  Preparation: After appropriate preanesthesia, the patient was brought to the operating room, placed on the operating table in the dorsal supine position, where general LMA anesthesia was introduced. He was then replaced in the dorsal lithotomy position with pubis was prepped with Betadine solution and draped in usual fashion. Armband was double checked.  Review history:Patient is a 71 yo male,  s/p cystoscopy/Cook balloon dilation/Gyrus vaportization of prostate and stricture on 10/28/10. He complains today that he has no sensation during the day of when he needs to void & has stress incontinence with positional changes. He has a hx of CaP, post seed implant, 2003 with EBRT; and bladder neck contracture (released by Dr. Vonita Moss in July 2009). Hx of osteopenia, but no longer taking Actonel due to cost. He is no longer taking Vit D supplement either.  Pt is now in cute Urinary Retention, and will have TUR-bladder neck contracture tonight.      Statement of  Likelihood of Success: Excellent. TIME-OUT observed.:  Procedure: Cystourethroscopy was accomplished and showed complete obliteration of the bladder neck. This was photo documented. Following this, the gyrus button electrode was then placed at the level bladder neck, and the bladder neck was entered in retrograde fashion bilateral vaporization of the bladder neck contracture at the 6 clock position. The entire bladder neck was then vaporized, and the bladder neck opened. Bleeding was electrocoagulated. The bladder was irrigated free of tissue, and no tissue was sent to laboratory. A size 20 Silastic Foley  catheter was placed with 30 cc in the balloon. The patient was awakened, taken recovery room in good condition.

## 2012-05-15 NOTE — Anesthesia Preprocedure Evaluation (Addendum)
Anesthesia Evaluation  Patient identified by MRN, date of birth, ID band Patient awake    Reviewed: Allergy & Precautions, H&P , NPO status , Patient's Chart, lab work & pertinent test results  Airway Mallampati: II TM Distance: >3 FB Neck ROM: Full    Dental No notable dental hx.    Pulmonary sleep apnea , COPD Quit smoking 35 years ago. Denies COPD. COPD was on a previous CXR.  Does not use CPAP breath sounds clear to auscultation  Pulmonary exam normal       Cardiovascular negative cardio ROS  Rhythm:Regular Rate:Normal     Neuro/Psych negative neurological ROS  negative psych ROS   GI/Hepatic negative GI ROS, Neg liver ROS,   Endo/Other  negative endocrine ROS  Renal/GU negative Renal ROS  negative genitourinary   Musculoskeletal negative musculoskeletal ROS (+)   Abdominal   Peds negative pediatric ROS (+)  Hematology negative hematology ROS (+)   Anesthesia Other Findings   Reproductive/Obstetrics negative OB ROS                          Anesthesia Physical Anesthesia Plan  ASA: III  Anesthesia Plan: General   Post-op Pain Management:    Induction: Intravenous  Airway Management Planned: Oral ETT  Additional Equipment:   Intra-op Plan:   Post-operative Plan: Extubation in OR  Informed Consent: I have reviewed the patients History and Physical, chart, labs and discussed the procedure including the risks, benefits and alternatives for the proposed anesthesia with the patient or authorized representative who has indicated his/her understanding and acceptance.   Dental advisory given  Plan Discussed with: CRNA  Anesthesia Plan Comments: (NPO after 1330 (cheese sandwich))       Anesthesia Quick Evaluation

## 2012-05-15 NOTE — ED Provider Notes (Cosign Needed)
History     CSN: [id number]  Arrival date & time 05/15/12  1756   First MD Initiated Contact with Patient 05/15/12 1959      Chief Complaint  Patient presents with  . Urinary Retention    (Consider location/radiation/quality/duration/timing/severity/associated sxs/prior treatment) HPI Patient with history of urinary retention and seen by Dr Patsi Sears in office today.  Dr. Patsi Sears has scheduled patient to go to or for catheter placement.  Patient denies fever or chills, nausea, vomiting.  Patient has had prior episode of urinary retention.  Patient with history of prostate cancer diagnosed in 2003 with seeds placed.  Past Medical History  Diagnosis Date  . History of urinary retention   . History of urethral stricture   . Bladder neck contracture   . Peyronie disease   . History of prostate cancer S/P SEED IMPLANTS   . COPD (chronic obstructive pulmonary disease) PER CXRAY  10-28-2010  . Urinary incontinence   . Nocturia   . Frequency of urination   . OSA (obstructive sleep apnea) NON-COMPLIANT    Past Surgical History  Procedure Date  . Cysto/ urethral dilation/  urethra balloon dilation / vaporization of prostate and urethral stricture 10-28-2010  . Cysto/ urethral dilation 04-21-2008  . Radioactive seed implants, prostate 06-20-2002  . Cataract extraction w/ intraocular lens  implant, bilateral   . Prostaglandin injection 2003  . Cystoscopy 01/27/2012    Procedure: CYSTOSCOPY;  Surgeon: Kathi Ludwig, MD;  Location: [facility];  Service: Urology;  Laterality: N/A;  cysto, cook balloon dilation of  bladder neck contracture gyrus button vaporization  carm     History reviewed. No pertinent family history.  History  Substance Use Topics  . Smoking status: Former Smoker -- 20 years    Types: Cigarettes    Quit date: 01/24/1981  . Smokeless tobacco: Never Used  . Alcohol Use: No      Review of Systems  All other systems reviewed and are  negative.    Allergies  Review of patient's allergies indicates no known allergies.  Home Medications  No current outpatient prescriptions on file.  BP 133/85  Pulse 65  Temp 98.1 F (36.7 C) (Oral)  Resp 18  SpO2 94%  Physical Exam  Nursing note and vitals reviewed. Constitutional: He is oriented to person, place, and time. He appears well-developed and well-nourished.  HENT:  Head: Normocephalic and atraumatic.  Eyes: Conjunctivae and EOM are normal. Pupils are equal, round, and reactive to light.  Neck: Normal range of motion. Neck supple.  Cardiovascular: Normal rate and regular rhythm.   Pulmonary/Chest: Effort normal.  Abdominal: Soft.  Musculoskeletal: Normal range of motion.  Neurological: He is alert and oriented to person, place, and time.  Skin: Skin is warm and dry.  Psychiatric: He has a normal mood and affect.    ED Course  Procedures (including critical care time)  Labs Reviewed - No data to display No results found.   No diagnosis found.    Date: 05/15/2012  Rate: 69  Rhythm: normal sinus rhythm  QRS Axis: normal  Intervals: normal  ST/T Wave abnormalities: nonspecific ST changes  Conduction Disutrbances:none  Narrative Interpretation:   Old EKG Reviewed changes noted   MDM  Dr. Patsi Sears awaiting patient in or.  MSE performed here and patient stable for or.         Hilario Quarry, MD 05/15/12 2009

## 2012-05-15 NOTE — H&P (Signed)
ctive Problems Problems  1. Adenocarcinoma Of The Prostate Gland 185 2. Bladder Neck Contracture 596.0 3. Nephrolithiasis 592.0 4. Nocturia 788.43 5. Osteopenia 733.90 6. Urethral Stricture 598.9 7. Urinary Incontinence With Continuous Leakage 788.37 8. Vitamin D Deficiency 268.9  History of Present Illness        71 yo male returns today for cystoscopy for hx of continous leaking w/o control since surgery.  Hx of prostate cancer, BNC and kidney stones.  He is s/p TURP on 01/27/12 for hx of BNC.  IPSS = 27/7.      Patient is s/p cystoscopy/Cook balloon dilation/Gyrus vaportization of prostate and stricture on 10/28/10.  He complains today that he has no sensation during the day of when he needs to void & has stress incontinence with positional changes.  He has a hx of CaP, post seed implant, 2003 wit EBRT; and bladder neck contracture (released by Dr. Vonita Moss in July 2009).  Hx of osteopenia, but no longer taking Actonel due to cost.  He is no longer taking Vit D supplement either.  07/05/11  PSA - 0.01     09/15/10 labs:  PSA - 0.02 and Vit D - 42.   Past Medical History Problems  1. History of  Adult Sleep Apnea 780.57  Surgical History Problems  1. History of  Cystoscopy For Urethral Stricture 2. History of  Cystoscopy With Fulguration 3. History of  Prostate Surgery 4. History of  Transurethral Resection Of Prostate (TURP)  Current Meds 1. No Reported Medications  Allergies Medication  1. No Known Drug Allergies  Family History Problems  1. Family history of  Family Health Status Number Of Children 2 sons; 2 daughters  Social History Problems  1. Caffeine Use tea 2. Family history of  Death In The Family Father 72; lung cancer 3. Marital History - Currently Married 4. Occupation: retired 5. Tobacco Use V15.82 1 1/2 pack X 20 yrs. Denied  6. History of  Alcohol Use  Review of Systems Genitourinary, constitutional, skin, eye, otolaryngeal,  hematologic/lymphatic, cardiovascular, pulmonary, endocrine, musculoskeletal, gastrointestinal, neurological and psychiatric system(s) were reviewed and pertinent findings if present are noted.  Genitourinary: urinary frequency, feelings of urinary urgency, nocturia, incontinence, weak urinary stream, urinary stream starts and stops, incomplete emptying of bladder, hematuria and initiating urination requires straining.    Vitals Vital Signs [Data Includes: Last 1 Day]  13Aug2013 11:26AM  Blood Pressure: 164 / 79 Temperature: 97.9 F Heart Rate: 57  Results/Data Urine [Data Includes: Last 1 Day]   13Aug2013  COLOR YELLOW   APPEARANCE CLEAR   SPECIFIC GRAVITY 1.025   pH 5.5   GLUCOSE NEG mg/dL  BILIRUBIN NEG   KETONE NEG mg/dL  BLOOD TRACE   PROTEIN NEG mg/dL  UROBILINOGEN 0.2 mg/dL  NITRITE NEG   LEUKOCYTE ESTERASE NEG   SQUAMOUS EPITHELIAL/HPF RARE   WBC 0-2 WBC/hpf  RBC 3-6 RBC/hpf  BACTERIA NONE SEEN   CRYSTALS NONE SEEN   CASTS NONE SEEN   Other MUCUS    Procedure  Procedure: Cystoscopy  Chaperone Present: Hassel Neth, CMA.  Indication: Lower Urinary Tract Symptoms. Urethral Stricture.  Informed Consent: Risks, benefits, and potential adverse events were discussed and informed consent was obtained from the patient.  Prep: The patient was prepped with betadine.  Anesthesia:. Local anesthesia was administered intraurethrally with 2% lidocaine jelly.  Antibiotic prophylaxis: Ciprofloxacin.  Procedure Note:  Urethral meatus:. No abnormalities.  Anterior urethra: No abnormalities.  Prostatic urethra:. There was visual obstruction of the prostatic urethra.  A bladder neck contracture was present.  ( severe)  Bladder:  The bladder could not be vizualized because the bladder neck is impassable. Pt is voiding by dribbling.    Assessment Assessed  1. Urethral Stricture 598.9 2. Adenocarcinoma Of The Prostate Gland 185 3. Bladder Neck Contracture 596.0 4. Urinary Incontinence  With Continuous Leakage 788.37   Severe bladder neck contracture. Will require Gyrus vaporization and catheterization. Overflow incontinence.    Plan     Schedule outpatient cysto/Gyrus vaporization of BNC.   Signatures Electronically signed by : Jethro Bolus, M.D.; May 15 2012  5:55PM

## 2012-05-15 NOTE — Interval H&P Note (Signed)
History and Physical Interval Note:  05/15/2012 8:26 PM  Steve [name]  has presented today for surgery, with the diagnosis of urinary obstruction, contracture of bladder neck  The various methods of treatment have been discussed with the patient and family. After consideration of risks, benefits and other options for treatment, the patient has consented to  Procedure(s) (LRB): TRANSURETHRAL RESECTION OF BLADDER TUMOR (TURBT) (N/A) CYSTO (N/A) as a surgical intervention .  The patient's history has been reviewed, patient examined, no change in status, stable for surgery.  I have reviewed the patient's chart and labs.  Questions were answered to the patient's satisfaction.     Jethro Bolus I

## 2012-05-15 NOTE — Anesthesia Postprocedure Evaluation (Signed)
  Anesthesia Post-op Note  Patient: Steve [name]  Procedure(s) Performed: Procedure(s) (LRB): TRANSURETHRAL RESECTION OF BLADDER TUMOR (TURBT) (N/A)  Patient Location: PACU  Anesthesia Type: General  Level of Consciousness: awake, alert , oriented, patient cooperative and responds to stimulation  Airway and Oxygen Therapy: Patient Spontanous Breathing  Post-op Pain: none  Post-op Assessment: Post-op Vital signs reviewed, Patient's Cardiovascular Status Stable, Respiratory Function Stable, Patent Airway and No signs of Nausea or vomiting  Post-op Vital Signs: stable  Complications: No apparent anesthesia complications

## 2012-05-15 NOTE — Transfer of Care (Signed)
Immediate Anesthesia Transfer of Care Note  Patient: Steve [name]  Procedure(s) Performed: Procedure(s) (LRB): TRANSURETHRAL RESECTION OF BLADDER TUMOR (TURBT) (N/A)  Patient Location: PACU  Anesthesia Type: General  Level of Consciousness: awake, alert , oriented and patient cooperative  Airway & Oxygen Therapy: Patient Spontanous Breathing and Patient connected to face mask oxygen  Post-op Assessment: Report given to PACU RN, Post -op Vital signs reviewed and stable and Patient moving all extremities X 4  Post vital signs: stable  Complications: No apparent anesthesia complications

## 2012-05-16 ENCOUNTER — Encounter (HOSPITAL_COMMUNITY): Payer: Self-pay | Admitting: Urology

## 2012-05-16 NOTE — Anesthesia Postprocedure Evaluation (Deleted)
  Anesthesia Post-op Note  Patient: Steve [name]  Procedure(s) Performed: Procedure(s) (LRB): TRANSURETHRAL RESECTION OF BLADDER TUMOR (TURBT) (N/A)  Patient Location: PACU  Anesthesia Type: General  Level of Consciousness: awake and alert   Airway and Oxygen Therapy: Patient Spontanous Breathing  Post-op Pain: mild  Post-op Assessment: Post-op Vital signs reviewed, Patient's Cardiovascular Status Stable, Respiratory Function Stable, Patent Airway and No signs of Nausea or vomiting  Post-op Vital Signs: stable  Complications: No apparent anesthesia complications

## 2012-05-30 DIAGNOSIS — N3945 Continuous leakage: Secondary | ICD-10-CM | POA: Diagnosis not present

## 2012-05-30 DIAGNOSIS — N320 Bladder-neck obstruction: Secondary | ICD-10-CM | POA: Diagnosis not present

## 2012-05-30 DIAGNOSIS — R82998 Other abnormal findings in urine: Secondary | ICD-10-CM | POA: Diagnosis not present

## 2012-05-30 MED ORDER — HYDROMORPHONE HCL PF 1 MG/ML IJ SOLN
INTRAMUSCULAR | Status: AC
  Filled 2012-05-30: qty 1

## 2012-06-18 DIAGNOSIS — R339 Retention of urine, unspecified: Secondary | ICD-10-CM | POA: Diagnosis not present

## 2012-06-18 DIAGNOSIS — N3945 Continuous leakage: Secondary | ICD-10-CM | POA: Diagnosis not present

## 2012-06-21 DIAGNOSIS — N35919 Unspecified urethral stricture, male, unspecified site: Secondary | ICD-10-CM | POA: Diagnosis not present

## 2012-06-23 ENCOUNTER — Encounter (HOSPITAL_COMMUNITY): Payer: Self-pay | Admitting: Emergency Medicine

## 2012-06-23 ENCOUNTER — Emergency Department (HOSPITAL_COMMUNITY)
Admission: EM | Admit: 2012-06-23 | Discharge: 2012-06-23 | Disposition: A | Discharge status: Home / Self Care | Payer: Medicare Other | Attending: Emergency Medicine | Admitting: Emergency Medicine

## 2012-06-23 DIAGNOSIS — N486 Induration penis plastica: Secondary | ICD-10-CM | POA: Diagnosis not present

## 2012-06-23 DIAGNOSIS — G4733 Obstructive sleep apnea (adult) (pediatric): Secondary | ICD-10-CM | POA: Diagnosis not present

## 2012-06-23 DIAGNOSIS — I10 Essential (primary) hypertension: Secondary | ICD-10-CM | POA: Insufficient documentation

## 2012-06-23 DIAGNOSIS — R339 Retention of urine, unspecified: Secondary | ICD-10-CM

## 2012-06-23 DIAGNOSIS — J449 Chronic obstructive pulmonary disease, unspecified: Secondary | ICD-10-CM | POA: Diagnosis not present

## 2012-06-23 DIAGNOSIS — R338 Other retention of urine: Secondary | ICD-10-CM | POA: Insufficient documentation

## 2012-06-23 DIAGNOSIS — Z8546 Personal history of malignant neoplasm of prostate: Secondary | ICD-10-CM | POA: Insufficient documentation

## 2012-06-23 DIAGNOSIS — Z87891 Personal history of nicotine dependence: Secondary | ICD-10-CM | POA: Insufficient documentation

## 2012-06-23 DIAGNOSIS — J4489 Other specified chronic obstructive pulmonary disease: Secondary | ICD-10-CM | POA: Insufficient documentation

## 2012-06-23 DIAGNOSIS — N390 Urinary tract infection, site not specified: Secondary | ICD-10-CM | POA: Diagnosis not present

## 2012-06-23 LAB — URINALYSIS, MICROSCOPIC ONLY
Bilirubin Urine: NEGATIVE
Glucose, UA: NEGATIVE mg/dL
Specific Gravity, Urine: 1.021 (ref 1.005–1.030)

## 2012-06-23 MED ORDER — SULFAMETHOXAZOLE-TMP DS 800-160 MG PO TABS
1.0000 | ORAL_TABLET | Freq: Once | ORAL | Status: AC
  Administered 2012-06-23: 1 via ORAL
  Filled 2012-06-23: qty 1

## 2012-06-23 MED ORDER — SULFAMETHOXAZOLE-TRIMETHOPRIM 800-160 MG PO TABS: 1.0000 | ORAL_TABLET | Freq: Two times a day (BID) | ORAL | Status: DC

## 2012-06-23 MED ORDER — LIDOCAINE HCL 2 % EX GEL
CUTANEOUS | Status: AC
  Filled 2012-06-23: qty 10

## 2012-06-23 NOTE — ED Provider Notes (Addendum)
History     CSN: [id number]  Arrival date & time 06/23/12  1119   First MD Initiated Contact with Patient 06/23/12 1152      Chief Complaint  Patient presents with  . Urinary Retention    (Consider location/radiation/quality/duration/timing/severity/associated sxs/prior treatment) HPI Complaint of inability to urinate since 12 midnight today. No other complaint no treatment prior to coming here patient.. Feels her bladder is full. Patient recently had urinary catheter for urinary retention which was discontinued within the past week Past Medical History  Diagnosis Date  . History of urinary retention   . History of urethral stricture   . Bladder neck contracture   . Peyronie disease   . History of prostate cancer S/P SEED IMPLANTS   . COPD (chronic obstructive pulmonary disease) PER CXRAY  10-28-2010  . Urinary incontinence   . Nocturia   . Frequency of urination   . OSA (obstructive sleep apnea) NON-COMPLIANT    Past Surgical History  Procedure Date  . Cysto/ urethral dilation/  urethra balloon dilation / vaporization of prostate and urethral stricture 10-28-2010  . Cysto/ urethral dilation 04-21-2008  . Radioactive seed implants, prostate 06-20-2002  . Cataract extraction w/ intraocular lens  implant, bilateral   . Prostaglandin injection 2003  . Cystoscopy 01/27/2012    Procedure: CYSTOSCOPY;  Surgeon: Kathi Ludwig, MD;  Location: [facility];  Service: Urology;  Laterality: N/A;  cysto, cook balloon dilation of  bladder neck contracture gyrus button vaporization  carm   . Transurethral resection of bladder tumor 05/15/2012    Procedure: TRANSURETHRAL RESECTION OF BLADDER TUMOR (TURBT);  Surgeon: Kathi Ludwig, MD;  Location: [facility];  Service: Urology;  Laterality: N/A;    History reviewed. No pertinent family history.  History  Substance Use Topics  . Smoking status: Former Smoker -- 20 years    Types: Cigarettes    Quit date:  01/24/1981  . Smokeless tobacco: Never Used  . Alcohol Use: No      Review of Systems  Genitourinary: Positive for urgency.  All other systems reviewed and are negative.    Allergies  Review of patient's allergies indicates no known allergies.  Home Medications   Current Outpatient Rx  Name Route Sig Dispense Refill  . SILODOSIN 8 MG PO CAPS Oral Take 8 mg by mouth daily with breakfast.    . TAPENTADOL HCL 100 MG PO TABS Oral Take 1 tablet (100 mg total) by mouth 4 (four) times daily as needed. 28 tablet 0    BP 190/96  Pulse 72  Temp 97.7 F (36.5 C) (Oral)  Resp 14  Ht 5\' 7"  (1.702 m)  Wt 210 lb (95.255 kg)  BMI 32.89 kg/m2  SpO2 95%  Physical Exam  Nursing note and vitals reviewed. Constitutional: He appears well-developed and well-nourished.  HENT:  Head: Normocephalic and atraumatic.  Eyes: Conjunctivae normal are normal. Pupils are equal, round, and reactive to light.  Neck: Neck supple. No tracheal deviation present. No thyromegaly present.  Cardiovascular: Normal rate and regular rhythm.   No murmur heard. Pulmonary/Chest: Effort normal and breath sounds normal.  Abdominal: Soft. Bowel sounds are normal. He exhibits no distension. There is no tenderness.  Genitourinary: Penis normal.  Musculoskeletal: Normal range of motion. He exhibits no edema and no tenderness.  Neurological: He is alert. Coordination normal.  Skin: Skin is warm and dry. No rash noted.  Psychiatric: He has a normal mood and affect.    ED Course  Procedures (  including critical care time) 2 PM patient feels improved after insertion of Foley catheter. Is presently fussy 500 mL of blood tinged urine in the Foley bag  Labs Reviewed  URINALYSIS, MICROSCOPIC ONLY   No results found.   No diagnosis found.  Results for orders placed during the hospital encounter of 06/23/12  URINALYSIS, MICROSCOPIC ONLY      Component Value Range   Color, Urine RED (*) YELLOW   APPearance TURBID  (*) CLEAR   Specific Gravity, Urine 1.021  1.005 - 1.030   pH 6.0  5.0 - 8.0   Glucose, UA NEGATIVE  NEGATIVE mg/dL   Hgb urine dipstick LARGE (*) NEGATIVE   Bilirubin Urine NEGATIVE  NEGATIVE   Ketones, ur TRACE (*) NEGATIVE mg/dL   Protein, ur 960 (*) NEGATIVE mg/dL   Urobilinogen, UA 1.0  0.0 - 1.0 mg/dL   Nitrite POSITIVE (*) NEGATIVE   Leukocytes, UA LARGE (*) NEGATIVE   WBC, UA TOO NUMEROUS TO COUNT  <3 WBC/hpf   RBC / HPF TOO NUMEROUS TO COUNT  <3 RBC/hpf   Bacteria, UA FEW (*) RARE   Urine-Other MICROSCOPIC EXAM PERFORMED ON UNCONCENTRATED URINE     No results found.   MDM  Case discussed with Dr.Manny Plan Foley catheter with leg bag to go. Urine culture. Prescription Bactrim DS Dr.Tannenbaum in 2 days. Suggest blood pressure recheck one week Diagnosis #1 urinary retention #2 UTI #23 hypertension       Doug Sou, MD 06/23/12 1424  Doug Sou, MD 06/23/12 1425

## 2012-06-23 NOTE — ED Notes (Signed)
Pt states he has had recurrent urinary retention due to prostate enlargement and scar tissue.  Pt states catheter was put in by urologist on Monday and removed Thursday.  Since 2400 last night patient has been unable to void.  Pt states he will go from periods of "leaking" during the day to frequently urinating at night.

## 2012-06-23 NOTE — ED Notes (Signed)
Catheter continues to drain.  No leakage noted.

## 2012-06-23 NOTE — ED Notes (Signed)
[id number]<AV> Expected date:06/23/12<BR> Expected time:11:26 AM<BR> Means of arrival:<BR> Comments:<BR> Pt in waiting area, waiting for registration to merge charts from OR to ED

## 2012-06-23 NOTE — ED Notes (Signed)
Leg bag applied

## 2012-06-23 NOTE — ED Notes (Signed)
Pt in room 4, waiting for provider, unable to move rooms due to being roomed in pre-op bed.

## 2012-06-23 NOTE — ED Notes (Signed)
Pt leaking small amount of urine

## 2012-06-26 LAB — URINE CULTURE: Colony Count: >=100000

## 2012-06-27 ENCOUNTER — Telehealth (HOSPITAL_COMMUNITY): Payer: Self-pay | Admitting: Emergency Medicine

## 2012-06-27 NOTE — ED Notes (Signed)
Results received from [facility].  URNC, >/= 100,000 colonies -> Serratia Marcescens.  Rx given in ED for Sulfa-Trimeth  -> sensitive to the same.  Chart appended per protocol.

## 2012-07-16 DIAGNOSIS — N3945 Continuous leakage: Secondary | ICD-10-CM | POA: Diagnosis not present

## 2012-07-16 DIAGNOSIS — N320 Bladder-neck obstruction: Secondary | ICD-10-CM | POA: Diagnosis not present

## 2012-07-16 DIAGNOSIS — R339 Retention of urine, unspecified: Secondary | ICD-10-CM | POA: Diagnosis not present

## 2012-07-27 DIAGNOSIS — N3945 Continuous leakage: Secondary | ICD-10-CM | POA: Diagnosis not present

## 2012-08-06 DIAGNOSIS — R339 Retention of urine, unspecified: Secondary | ICD-10-CM | POA: Diagnosis not present

## 2012-08-15 DIAGNOSIS — N320 Bladder-neck obstruction: Secondary | ICD-10-CM | POA: Diagnosis not present

## 2012-08-15 DIAGNOSIS — R339 Retention of urine, unspecified: Secondary | ICD-10-CM | POA: Diagnosis not present

## 2012-08-15 DIAGNOSIS — N35919 Unspecified urethral stricture, male, unspecified site: Secondary | ICD-10-CM | POA: Diagnosis not present

## 2012-08-25 ENCOUNTER — Emergency Department (HOSPITAL_COMMUNITY)
Admission: EM | Admit: 2012-08-25 | Discharge: 2012-08-25 | Disposition: A | Discharge status: Home / Self Care | Payer: Medicare Other | Attending: Emergency Medicine | Admitting: Emergency Medicine

## 2012-08-25 ENCOUNTER — Encounter (HOSPITAL_COMMUNITY): Payer: Self-pay | Admitting: Emergency Medicine

## 2012-08-25 DIAGNOSIS — G4733 Obstructive sleep apnea (adult) (pediatric): Secondary | ICD-10-CM | POA: Diagnosis not present

## 2012-08-25 DIAGNOSIS — Z8546 Personal history of malignant neoplasm of prostate: Secondary | ICD-10-CM | POA: Diagnosis not present

## 2012-08-25 DIAGNOSIS — J449 Chronic obstructive pulmonary disease, unspecified: Secondary | ICD-10-CM | POA: Insufficient documentation

## 2012-08-25 DIAGNOSIS — N486 Induration penis plastica: Secondary | ICD-10-CM | POA: Diagnosis not present

## 2012-08-25 DIAGNOSIS — N390 Urinary tract infection, site not specified: Secondary | ICD-10-CM | POA: Diagnosis not present

## 2012-08-25 DIAGNOSIS — R339 Retention of urine, unspecified: Secondary | ICD-10-CM | POA: Diagnosis not present

## 2012-08-25 DIAGNOSIS — Z9889 Other specified postprocedural states: Secondary | ICD-10-CM | POA: Insufficient documentation

## 2012-08-25 DIAGNOSIS — R109 Unspecified abdominal pain: Secondary | ICD-10-CM | POA: Diagnosis not present

## 2012-08-25 DIAGNOSIS — Z87891 Personal history of nicotine dependence: Secondary | ICD-10-CM | POA: Diagnosis not present

## 2012-08-25 DIAGNOSIS — J4489 Other specified chronic obstructive pulmonary disease: Secondary | ICD-10-CM | POA: Insufficient documentation

## 2012-08-25 LAB — CBC WITH DIFFERENTIAL/PLATELET
Basophils Absolute: 0 10*3/uL (ref 0.0–0.1)
Basophils Relative: 0 % (ref 0–1)
Eosinophils Absolute: 0 10*3/uL (ref 0.0–0.7)
HCT: 37.6 % — ABNORMAL LOW (ref 39.0–52.0)
Hemoglobin: 12.7 g/dL — ABNORMAL LOW (ref 13.0–17.0)
MCH: 29.7 pg (ref 26.0–34.0)
MCHC: 33.8 g/dL (ref 30.0–36.0)
Monocytes Absolute: 0.7 10*3/uL (ref 0.1–1.0)
Monocytes Relative: 6 % (ref 3–12)
Neutro Abs: 10.2 10*3/uL — ABNORMAL HIGH (ref 1.7–7.7)
Neutrophils Relative %: 88 % — ABNORMAL HIGH (ref 43–77)
RDW: 12.7 % (ref 11.5–15.5)

## 2012-08-25 LAB — URINALYSIS, MICROSCOPIC ONLY
Bilirubin Urine: NEGATIVE
Ketones, ur: NEGATIVE mg/dL
Nitrite: POSITIVE — AB
Urobilinogen, UA: 0.2 mg/dL (ref 0.0–1.0)

## 2012-08-25 LAB — BASIC METABOLIC PANEL
BUN: 23 mg/dL (ref 6–23)
Chloride: 104 mEq/L (ref 96–112)
Creatinine, Ser: 1.71 mg/dL — ABNORMAL HIGH (ref 0.50–1.35)
GFR calc Af Amer: 44 mL/min — ABNORMAL LOW (ref >90)
GFR calc non Af Amer: 38 mL/min — ABNORMAL LOW (ref >90)

## 2012-08-25 MED ORDER — CIPROFLOXACIN HCL 500 MG PO TABS: 500.0000 mg | ORAL_TABLET | Freq: Two times a day (BID) | ORAL | Status: DC

## 2012-08-25 MED ORDER — CIPROFLOXACIN HCL 500 MG PO TABS
500.0000 mg | ORAL_TABLET | Freq: Once | ORAL | Status: AC
  Administered 2012-08-25: 500 mg via ORAL
  Filled 2012-08-25: qty 1

## 2012-08-25 NOTE — ED Notes (Signed)
Pt w/ inability to void since 1900 last p.m. Catheter removed about 10 days ago, has had trouble all week w/ leakage, now is unable to void at all.

## 2012-08-25 NOTE — ED Provider Notes (Signed)
History     CSN: [id number]  Arrival date & time 08/25/12  1056   First MD Initiated Contact with Patient 08/25/12 1124      Chief Complaint  Patient presents with  . Urinary Retention    (Consider location/radiation/quality/duration/timing/severity/associated sxs/prior treatment) The history is provided by the patient.  Steve [name] is a 71 y.o. male history of prostate cancer status post radioactive seed implants, urinary tension, COPD here with urinary retention. He had a urinary catheter placed about 3 weeks ago and was taken out 10 days ago. Since then he's been dribbling and having trouble urinating. Since yesterday 5 PM he was unable to urinate at all. Denies any fevers or chills. Has urology followup.   Past Medical History  Diagnosis Date  . History of urinary retention   . History of urethral stricture   . Bladder neck contracture   . Peyronie disease   . History of prostate cancer S/P SEED IMPLANTS   . COPD (chronic obstructive pulmonary disease) PER CXRAY  10-28-2010  . Urinary incontinence   . Nocturia   . Frequency of urination   . OSA (obstructive sleep apnea) NON-COMPLIANT    Past Surgical History  Procedure Date  . Cysto/ urethral dilation/  urethra balloon dilation / vaporization of prostate and urethral stricture 10-28-2010  . Cysto/ urethral dilation 04-21-2008  . Radioactive seed implants, prostate 06-20-2002  . Cataract extraction w/ intraocular lens  implant, bilateral   . Prostaglandin injection 2003  . Cystoscopy 01/27/2012    Procedure: CYSTOSCOPY;  Surgeon: Kathi Ludwig, MD;  Location: [facility];  Service: Urology;  Laterality: N/A;  cysto, cook balloon dilation of  bladder neck contracture gyrus button vaporization  carm   . Transurethral resection of bladder tumor 05/15/2012    Procedure: TRANSURETHRAL RESECTION OF BLADDER TUMOR (TURBT);  Surgeon: Kathi Ludwig, MD;  Location: [facility];  Service: Urology;   Laterality: N/A;    No family history on file.  History  Substance Use Topics  . Smoking status: Former Smoker -- 20 years    Types: Cigarettes    Quit date: 01/24/1981  . Smokeless tobacco: Never Used  . Alcohol Use: No      Review of Systems  Gastrointestinal: Positive for abdominal pain.  Genitourinary: Positive for difficulty urinating.  All other systems reviewed and are negative.    Allergies  Review of patient's allergies indicates no known allergies.  Home Medications  No current outpatient prescriptions on file.  BP 194/99  Pulse 86  Temp 97.7 F (36.5 C) (Oral)  Resp 16  SpO2 96%  Physical Exam  Nursing note and vitals reviewed. Constitutional: He is oriented to person, place, and time.       Uncomfortable  HENT:  Head: Normocephalic.  Mouth/Throat: Oropharynx is clear and moist.  Eyes: Conjunctivae normal are normal. Pupils are equal, round, and reactive to light.  Neck: Normal range of motion. Neck supple.  Cardiovascular: Normal rate, regular rhythm and normal heart sounds.   Pulmonary/Chest: Effort normal and breath sounds normal. No respiratory distress. He has no wheezes. He has no rales.  Abdominal: Soft.       + suprapubic tenderness. No CVAT   Musculoskeletal: Normal range of motion.  Neurological: He is alert and oriented to person, place, and time.  Skin: Skin is warm and dry.  Psychiatric: He has a normal mood and affect. His behavior is normal. Judgment and thought content normal.    ED Course  Procedures (including critical care time)  Labs Reviewed  URINALYSIS, MICROSCOPIC ONLY - Abnormal; Notable for the following:    APPearance CLOUDY (*)     Hgb urine dipstick LARGE (*)     Protein, ur 30 (*)     Nitrite POSITIVE (*)     Leukocytes, UA LARGE (*)     Bacteria, UA MANY (*)     All other components within normal limits  CBC WITH DIFFERENTIAL - Abnormal; Notable for the following:    WBC 11.5 (*)     Hemoglobin 12.7 (*)      HCT 37.6 (*)     Neutrophils Relative 88 (*)     Neutro Abs 10.2 (*)     Lymphocytes Relative 6 (*)     All other components within normal limits  BASIC METABOLIC PANEL - Abnormal; Notable for the following:    Glucose, Bld 125 (*)     Creatinine, Ser 1.71 (*)     GFR calc non Af Amer 38 (*)     GFR calc Af Amer 44 (*)     All other components within normal limits  URINE CULTURE   No results found.   No diagnosis found.    MDM  Steve [name] is a 71 y.o. male here with urinary retention. Will place foley, get labs, and reassess.   1:51 PM Foley placed by nursing. About 500 cc drained out. Abdomen nontender now. UA + leuk and nitrates, given cipro PO (previous culture in September sensitive to cipro). Cr slightly elevated, likely due to prostatic obstruction. Recommend fluids and outpatient f/u.         Richardean Canal, MD 08/25/12 [phone]

## 2012-08-28 LAB — URINE CULTURE: Colony Count: >=100000

## 2012-09-03 DIAGNOSIS — N320 Bladder-neck obstruction: Secondary | ICD-10-CM | POA: Diagnosis not present

## 2012-09-03 DIAGNOSIS — R339 Retention of urine, unspecified: Secondary | ICD-10-CM | POA: Diagnosis not present

## 2012-09-03 DIAGNOSIS — C61 Malignant neoplasm of prostate: Secondary | ICD-10-CM | POA: Diagnosis not present

## 2012-09-03 DIAGNOSIS — N3000 Acute cystitis without hematuria: Secondary | ICD-10-CM | POA: Diagnosis not present

## 2012-10-04 DIAGNOSIS — R339 Retention of urine, unspecified: Secondary | ICD-10-CM | POA: Diagnosis not present

## 2012-10-04 DIAGNOSIS — C61 Malignant neoplasm of prostate: Secondary | ICD-10-CM | POA: Diagnosis not present

## 2012-10-04 DIAGNOSIS — N35919 Unspecified urethral stricture, male, unspecified site: Secondary | ICD-10-CM | POA: Diagnosis not present

## 2012-10-16 ENCOUNTER — Encounter (HOSPITAL_COMMUNITY): Payer: Self-pay | Admitting: Pharmacy Technician

## 2012-10-16 DIAGNOSIS — H33029 Retinal detachment with multiple breaks, unspecified eye: Secondary | ICD-10-CM | POA: Diagnosis not present

## 2012-10-17 ENCOUNTER — Encounter (INDEPENDENT_AMBULATORY_CARE_PROVIDER_SITE_OTHER): Payer: Medicare Other | Admitting: Ophthalmology

## 2012-10-17 DIAGNOSIS — H43819 Vitreous degeneration, unspecified eye: Secondary | ICD-10-CM | POA: Diagnosis not present

## 2012-10-17 DIAGNOSIS — H33009 Unspecified retinal detachment with retinal break, unspecified eye: Secondary | ICD-10-CM | POA: Diagnosis not present

## 2012-10-17 DIAGNOSIS — H3520 Other non-diabetic proliferative retinopathy, unspecified eye: Secondary | ICD-10-CM

## 2012-10-17 DIAGNOSIS — H3521 Other non-diabetic proliferative retinopathy, right eye: Secondary | ICD-10-CM | POA: Diagnosis present

## 2012-10-17 NOTE — H&P (Signed)
Nizar Cutler is an 72 y.o. male.   Chief Complaint: Loss of vision right eye HPI: 8-30 days of vision loss right eye  Past Medical History  Diagnosis Date  . History of urinary retention   . History of urethral stricture   . Bladder neck contracture   . Peyronie disease   . History of prostate cancer S/P SEED IMPLANTS   . COPD (chronic obstructive pulmonary disease) PER CXRAY  10-28-2010  . Urinary incontinence   . Nocturia   . Frequency of urination   . OSA (obstructive sleep apnea) NON-COMPLIANT    Past Surgical History  Procedure Date  . Cysto/ urethral dilation/  urethra balloon dilation / vaporization of prostate and urethral stricture 10-28-2010  . Cysto/ urethral dilation 04-21-2008  . Radioactive seed implants, prostate 06-20-2002  . Cataract extraction w/ intraocular lens  implant, bilateral   . Prostaglandin injection 2003  . Cystoscopy 01/27/2012    Procedure: CYSTOSCOPY;  Surgeon: Kathi Ludwig, MD;  Location: [facility];  Service: Urology;  Laterality: N/A;  cysto, cook balloon dilation of  bladder neck contracture gyrus button vaporization  carm   . Transurethral resection of bladder tumor 05/15/2012    Procedure: TRANSURETHRAL RESECTION OF BLADDER TUMOR (TURBT);  Surgeon: Kathi Ludwig, MD;  Location: [facility];  Service: Urology;  Laterality: N/A;    No family history on file. Social History:  reports that he quit smoking about 31 years ago. His smoking use included Cigarettes. He quit after 20 years of use. He has never used smokeless tobacco. He reports that he does not drink alcohol or use illicit drugs.  Allergies: No Known Allergies  No prescriptions prior to admission    Review of systems otherwise negative  There were no vitals taken for this visit.  Physical exam: Mental status: oriented x3. Eyes: See eye exam associated with this date of surgery in media tab.  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

## 2012-10-19 NOTE — Progress Notes (Signed)
Nurse called but patient was unavailable at this time. Will call back Monday.

## 2012-10-22 ENCOUNTER — Encounter (HOSPITAL_COMMUNITY): Payer: Self-pay

## 2012-10-22 MED ORDER — CEFAZOLIN SODIUM-DEXTROSE 2-3 GM-% IV SOLR
2.0000 g | INTRAVENOUS | Status: DC
  Filled 2012-10-22: qty 50

## 2012-10-23 ENCOUNTER — Encounter (HOSPITAL_COMMUNITY)
Admission: RE | Disposition: A | Discharge status: Home / Self Care | Payer: Self-pay | Source: Ambulatory Visit | Attending: Ophthalmology

## 2012-10-23 ENCOUNTER — Encounter (HOSPITAL_COMMUNITY): Payer: Self-pay | Admitting: Anesthesiology

## 2012-10-23 ENCOUNTER — Ambulatory Visit (HOSPITAL_COMMUNITY)
Admission: RE | Admit: 2012-10-23 | Discharge: 2012-10-24 | Disposition: A | Discharge status: Home / Self Care | Payer: Medicare Other | Source: Ambulatory Visit | Attending: Ophthalmology | Admitting: Ophthalmology

## 2012-10-23 ENCOUNTER — Ambulatory Visit (HOSPITAL_COMMUNITY): Payer: Medicare Other

## 2012-10-23 ENCOUNTER — Encounter (HOSPITAL_COMMUNITY): Payer: Self-pay | Admitting: *Deleted

## 2012-10-23 ENCOUNTER — Ambulatory Visit (HOSPITAL_COMMUNITY): Payer: Medicare Other | Admitting: Anesthesiology

## 2012-10-23 DIAGNOSIS — H3520 Other non-diabetic proliferative retinopathy, unspecified eye: Secondary | ICD-10-CM | POA: Diagnosis not present

## 2012-10-23 DIAGNOSIS — H33009 Unspecified retinal detachment with retinal break, unspecified eye: Secondary | ICD-10-CM | POA: Diagnosis present

## 2012-10-23 DIAGNOSIS — G4733 Obstructive sleep apnea (adult) (pediatric): Secondary | ICD-10-CM | POA: Diagnosis not present

## 2012-10-23 DIAGNOSIS — G4730 Sleep apnea, unspecified: Secondary | ICD-10-CM | POA: Diagnosis not present

## 2012-10-23 DIAGNOSIS — J449 Chronic obstructive pulmonary disease, unspecified: Secondary | ICD-10-CM | POA: Insufficient documentation

## 2012-10-23 DIAGNOSIS — J4489 Other specified chronic obstructive pulmonary disease: Secondary | ICD-10-CM | POA: Diagnosis not present

## 2012-10-23 DIAGNOSIS — H3521 Other non-diabetic proliferative retinopathy, right eye: Secondary | ICD-10-CM | POA: Diagnosis present

## 2012-10-23 HISTORY — PX: PHOTOCOAGULATION WITH LASER: SHX6027

## 2012-10-23 HISTORY — PX: GAS INSERTION: SHX5336

## 2012-10-23 HISTORY — PX: SCLERAL BUCKLE: SHX5340

## 2012-10-23 LAB — SURGICAL PCR SCREEN
MRSA, PCR: NEGATIVE
Staphylococcus aureus: NEGATIVE

## 2012-10-23 LAB — CBC
Hemoglobin: 14.4 g/dL (ref 13.0–17.0)
RBC: 4.63 MIL/uL (ref 4.22–5.81)

## 2012-10-23 SURGERY — SCLERAL BUCKLE
Anesthesia: General | Site: Eye | Laterality: Right | Wound class: Clean

## 2012-10-23 MED ORDER — LATANOPROST 0.005 % OP SOLN
1.0000 [drp] | Freq: Every day | OPHTHALMIC | Status: DC
  Filled 2012-10-23: qty 2.5

## 2012-10-23 MED ORDER — TROPICAMIDE 1 % OP SOLN: 1.0000 [drp] | OPHTHALMIC | Status: DC | PRN

## 2012-10-23 MED ORDER — MINERAL OIL LIGHT 100 % EX OIL
TOPICAL_OIL | CUTANEOUS | Status: AC
  Filled 2012-10-23: qty 25

## 2012-10-23 MED ORDER — BSS IO SOLN
INTRAOCULAR | Status: AC
  Filled 2012-10-23: qty 15

## 2012-10-23 MED ORDER — BACITRACIN-POLYMYXIN B 500-10000 UNIT/GM OP OINT
1.0000 "application " | TOPICAL_OINTMENT | Freq: Four times a day (QID) | OPHTHALMIC | Status: DC
  Filled 2012-10-23: qty 3.5

## 2012-10-23 MED ORDER — CYCLOPENTOLATE HCL 1 % OP SOLN: 1.0000 [drp] | OPHTHALMIC | Status: DC | PRN

## 2012-10-23 MED ORDER — PHENYLEPHRINE HCL 2.5 % OP SOLN: 1.0000 [drp] | OPHTHALMIC | Status: DC | PRN

## 2012-10-23 MED ORDER — CYCLOPENTOLATE HCL 1 % OP SOLN
1.0000 [drp] | OPHTHALMIC | Status: AC | PRN
  Administered 2012-10-23 (×3): 1 [drp] via OPHTHALMIC
  Filled 2012-10-23: qty 2

## 2012-10-23 MED ORDER — ATROPINE SULFATE 1 % OP SOLN
OPHTHALMIC | Status: DC | PRN
  Administered 2012-10-23: 1 [drp] via OPHTHALMIC

## 2012-10-23 MED ORDER — HYPROMELLOSE (GONIOSCOPIC) 2.5 % OP SOLN
OPHTHALMIC | Status: AC
  Filled 2012-10-23: qty 15

## 2012-10-23 MED ORDER — ATROPINE SULFATE 1 % OP SOLN
OPHTHALMIC | Status: AC
  Filled 2012-10-23: qty 2

## 2012-10-23 MED ORDER — TETRACAINE HCL 0.5 % OP SOLN
2.0000 [drp] | Freq: Once | OPHTHALMIC | Status: DC
  Filled 2012-10-23: qty 2

## 2012-10-23 MED ORDER — HYDROCODONE-ACETAMINOPHEN 5-325 MG PO TABS: 1.0000 | ORAL_TABLET | ORAL | Status: DC | PRN

## 2012-10-23 MED ORDER — GATIFLOXACIN 0.5 % OP SOLN: 1.0000 [drp] | OPHTHALMIC | Status: DC | PRN

## 2012-10-23 MED ORDER — BSS PLUS IO SOLN: INTRAOCULAR | Status: DC | PRN

## 2012-10-23 MED ORDER — ACETAZOLAMIDE SODIUM 500 MG IJ SOLR
500.0000 mg | Freq: Once | INTRAMUSCULAR | Status: AC
  Administered 2012-10-24: 500 mg via INTRAVENOUS
  Filled 2012-10-23: qty 500

## 2012-10-23 MED ORDER — DEXAMETHASONE SODIUM PHOSPHATE 10 MG/ML IJ SOLN
INTRAMUSCULAR | Status: AC
  Filled 2012-10-23: qty 1

## 2012-10-23 MED ORDER — GLYCOPYRROLATE 0.2 MG/ML IJ SOLN
INTRAMUSCULAR | Status: DC | PRN
  Administered 2012-10-23: .6 mg via INTRAVENOUS

## 2012-10-23 MED ORDER — ACETAMINOPHEN 325 MG PO TABS: 325.0000 mg | ORAL_TABLET | ORAL | Status: DC | PRN

## 2012-10-23 MED ORDER — IBUPROFEN 400 MG PO TABS
400.0000 mg | ORAL_TABLET | Freq: Four times a day (QID) | ORAL | Status: DC | PRN
  Filled 2012-10-23: qty 1

## 2012-10-23 MED ORDER — BACITRACIN-POLYMYXIN B 500-10000 UNIT/GM OP OINT
TOPICAL_OINTMENT | OPHTHALMIC | Status: DC | PRN
  Administered 2012-10-23: 1 via OPHTHALMIC

## 2012-10-23 MED ORDER — PHENYLEPHRINE HCL 10 MG/ML IJ SOLN: 10.0000 mg | INTRAVENOUS | Status: DC | PRN

## 2012-10-23 MED ORDER — MORPHINE SULFATE 2 MG/ML IJ SOLN
1.0000 mg | INTRAMUSCULAR | Status: DC | PRN
Start: 2012-10-23 — End: 2012-10-24

## 2012-10-23 MED ORDER — DEXAMETHASONE SODIUM PHOSPHATE 10 MG/ML IJ SOLN
INTRAMUSCULAR | Status: DC | PRN
  Administered 2012-10-23: 10 mg

## 2012-10-23 MED ORDER — BUPIVACAINE HCL (PF) 0.75 % IJ SOLN
INTRAMUSCULAR | Status: DC | PRN
  Administered 2012-10-23: 10 mL

## 2012-10-23 MED ORDER — PREDNISOLONE ACETATE 1 % OP SUSP
1.0000 [drp] | Freq: Four times a day (QID) | OPHTHALMIC | Status: DC
  Filled 2012-10-23: qty 1

## 2012-10-23 MED ORDER — BRIMONIDINE TARTRATE 0.2 % OP SOLN
1.0000 [drp] | Freq: Two times a day (BID) | OPHTHALMIC | Status: DC
  Filled 2012-10-23: qty 5

## 2012-10-23 MED ORDER — MAGNESIUM HYDROXIDE 400 MG/5ML PO SUSP: 15.0000 mL | Freq: Four times a day (QID) | ORAL | Status: DC | PRN

## 2012-10-23 MED ORDER — HEMOSTATIC AGENTS (NO CHARGE) OPTIME
TOPICAL | Status: DC | PRN
  Administered 2012-10-23: 1 via TOPICAL

## 2012-10-23 MED ORDER — SODIUM CHLORIDE 0.9 % IV SOLN
INTRAVENOUS | Status: DC
  Administered 2012-10-23 (×3): via INTRAVENOUS

## 2012-10-23 MED ORDER — PHENYLEPHRINE HCL 10 MG/ML IJ SOLN
10.0000 mg | INTRAVENOUS | Status: DC | PRN
  Administered 2012-10-23: 10 ug/min via INTRAVENOUS

## 2012-10-23 MED ORDER — SODIUM HYALURONATE 10 MG/ML IO SOLN
INTRAOCULAR | Status: AC
  Filled 2012-10-23: qty 0.85

## 2012-10-23 MED ORDER — LIDOCAINE HCL 2 % IJ SOLN
INTRAMUSCULAR | Status: AC
  Filled 2012-10-23: qty 20

## 2012-10-23 MED ORDER — GENTAMICIN SULFATE 40 MG/ML IJ SOLN
INTRAMUSCULAR | Status: AC
  Filled 2012-10-23: qty 2

## 2012-10-23 MED ORDER — GATIFLOXACIN 0.5 % OP SOLN
1.0000 [drp] | OPHTHALMIC | Status: AC | PRN
  Administered 2012-10-23 (×3): 1 [drp] via OPHTHALMIC
  Filled 2012-10-23: qty 2.5

## 2012-10-23 MED ORDER — BACITRACIN-POLYMYXIN B 500-10000 UNIT/GM OP OINT
TOPICAL_OINTMENT | OPHTHALMIC | Status: AC
  Filled 2012-10-23: qty 3.5

## 2012-10-23 MED ORDER — GATIFLOXACIN 0.5 % OP SOLN
1.0000 [drp] | Freq: Four times a day (QID) | OPHTHALMIC | Status: DC
  Filled 2012-10-23: qty 2.5

## 2012-10-23 MED ORDER — ONDANSETRON HCL 4 MG/2ML IJ SOLN
INTRAMUSCULAR | Status: AC
  Administered 2012-10-23: 4 mg
  Filled 2012-10-23: qty 2

## 2012-10-23 MED ORDER — ONDANSETRON HCL 4 MG/2ML IJ SOLN
INTRAMUSCULAR | Status: DC | PRN
  Administered 2012-10-23: 4 mg via INTRAVENOUS

## 2012-10-23 MED ORDER — EPINEPHRINE HCL 1 MG/ML IJ SOLN
INTRAMUSCULAR | Status: AC
  Filled 2012-10-23: qty 1

## 2012-10-23 MED ORDER — ROCURONIUM BROMIDE 100 MG/10ML IV SOLN
INTRAVENOUS | Status: DC | PRN
  Administered 2012-10-23: 10 mg via INTRAVENOUS
  Administered 2012-10-23: 20 mg via INTRAVENOUS
  Administered 2012-10-23: 30 mg via INTRAVENOUS
  Administered 2012-10-23: 10 mg via INTRAVENOUS

## 2012-10-23 MED ORDER — FENTANYL CITRATE 0.05 MG/ML IJ SOLN
INTRAMUSCULAR | Status: DC | PRN
  Administered 2012-10-23 (×2): 100 ug via INTRAVENOUS

## 2012-10-23 MED ORDER — PROMETHAZINE HCL 25 MG/ML IJ SOLN
6.2500 mg | Freq: Once | INTRAMUSCULAR | Status: AC
  Administered 2012-10-23: 6.25 mg via INTRAVENOUS

## 2012-10-23 MED ORDER — PROMETHAZINE HCL 25 MG/ML IJ SOLN
INTRAMUSCULAR | Status: AC
  Administered 2012-10-23: 6.25 mg via INTRAVENOUS
  Filled 2012-10-23: qty 1

## 2012-10-23 MED ORDER — BUPIVACAINE HCL (PF) 0.75 % IJ SOLN
INTRAMUSCULAR | Status: AC
  Filled 2012-10-23: qty 10

## 2012-10-23 MED ORDER — POLYMYXIN B SULFATE 500000 UNITS IJ SOLR
INTRAMUSCULAR | Status: AC
  Filled 2012-10-23: qty 1

## 2012-10-23 MED ORDER — ACETAZOLAMIDE SODIUM 500 MG IJ SOLR
INTRAMUSCULAR | Status: AC
  Filled 2012-10-23: qty 500

## 2012-10-23 MED ORDER — MUPIROCIN 2 % EX OINT
TOPICAL_OINTMENT | Freq: Two times a day (BID) | CUTANEOUS | Status: DC
  Administered 2012-10-23: 11:00:00 via NASAL
  Filled 2012-10-23: qty 22

## 2012-10-23 MED ORDER — BSS PLUS IO SOLN
INTRAOCULAR | Status: AC
  Filled 2012-10-23: qty 500

## 2012-10-23 MED ORDER — SODIUM CHLORIDE 0.45 % IV SOLN
INTRAVENOUS | Status: DC
  Administered 2012-10-23: 20:00:00 via INTRAVENOUS

## 2012-10-23 MED ORDER — BSS IO SOLN
INTRAOCULAR | Status: DC | PRN
  Administered 2012-10-23: 15 mL via INTRAOCULAR

## 2012-10-23 MED ORDER — TROPICAMIDE 1 % OP SOLN
1.0000 [drp] | OPHTHALMIC | Status: AC | PRN
  Administered 2012-10-23 (×3): 1 [drp] via OPHTHALMIC
  Filled 2012-10-23: qty 3

## 2012-10-23 MED ORDER — NEOSTIGMINE METHYLSULFATE 1 MG/ML IJ SOLN
INTRAMUSCULAR | Status: DC | PRN
  Administered 2012-10-23: 5 mg via INTRAVENOUS

## 2012-10-23 MED ORDER — HYDROMORPHONE HCL PF 1 MG/ML IJ SOLN: 0.2500 mg | INTRAMUSCULAR | Status: DC | PRN

## 2012-10-23 MED ORDER — PHENYLEPHRINE HCL 2.5 % OP SOLN
1.0000 [drp] | OPHTHALMIC | Status: AC | PRN
  Administered 2012-10-23 (×3): 1 [drp] via OPHTHALMIC
  Filled 2012-10-23: qty 3

## 2012-10-23 MED ORDER — TEMAZEPAM 15 MG PO CAPS: 15.0000 mg | ORAL_CAPSULE | Freq: Every evening | ORAL | Status: DC | PRN

## 2012-10-23 MED ORDER — ONDANSETRON HCL 4 MG/2ML IJ SOLN: 4.0000 mg | Freq: Four times a day (QID) | INTRAMUSCULAR | Status: DC | PRN

## 2012-10-23 MED ORDER — DOCUSATE SODIUM 100 MG PO CAPS
100.0000 mg | ORAL_CAPSULE | Freq: Two times a day (BID) | ORAL | Status: DC
  Administered 2012-10-23: 100 mg via ORAL
  Filled 2012-10-23: qty 1

## 2012-10-23 SURGICAL SUPPLY — 81 items
APL SRG 3 HI ABS STRL LF PLS (MISCELLANEOUS) ×12
APPLICATOR DR MATTHEWS STRL (MISCELLANEOUS) ×22 IMPLANT
BALL CTTN LRG ABS STRL LF (GAUZE/BANDAGES/DRESSINGS) ×6
BAND CIRCLING RETINAL 2.5 (Ophthalmic Related) IMPLANT
BAND RTNL 125X2.5X.6 CRC (Ophthalmic Related) ×2 IMPLANT
BLADE EYE CATARACT 19 1.4 BEAV (BLADE) IMPLANT
BLADE MVR KNIFE 19G (BLADE) IMPLANT
BLADE SURG 15 STRL LF DISP TIS (BLADE) IMPLANT
BLADE SURG 15 STRL SS (BLADE)
CANNULA ANT CHAM MAIN (OPHTHALMIC RELATED) IMPLANT
CANNULA DUAL BORE 23G (CANNULA) IMPLANT
CORDS BIPOLAR (ELECTRODE) IMPLANT
COTTONBALL LRG STERILE PKG (GAUZE/BANDAGES/DRESSINGS) ×9 IMPLANT
COVER SURGICAL LIGHT HANDLE (MISCELLANEOUS) ×4 IMPLANT
DRAPE OPHTHALMIC 77X100 STRL (CUSTOM PROCEDURE TRAY) ×3 IMPLANT
ERASER HMR WETFIELD 23G BP (MISCELLANEOUS) IMPLANT
FILTER BLUE MILLIPORE (MISCELLANEOUS) ×2 IMPLANT
FILTER STRAW FLUID ASPIR (MISCELLANEOUS) IMPLANT
GAS OPHTHALMIC (MISCELLANEOUS) ×1 IMPLANT
GLOVE BIOGEL PI IND STRL 6.5 (GLOVE) IMPLANT
GLOVE BIOGEL PI INDICATOR 6.5 (GLOVE) ×1
GLOVE SS BIOGEL STRL SZ 6.5 (GLOVE) ×4 IMPLANT
GLOVE SS BIOGEL STRL SZ 7 (GLOVE) ×2 IMPLANT
GLOVE SUPERSENSE BIOGEL SZ 6.5 (GLOVE) ×2
GLOVE SUPERSENSE BIOGEL SZ 7 (GLOVE) ×1
GLOVE SURG 8.5 LATEX PF (GLOVE) ×6 IMPLANT
GOWN STRL NON-REIN LRG LVL3 (GOWN DISPOSABLE) ×6 IMPLANT
ILLUMINATOR CHOW PICK 25GA (MISCELLANEOUS) IMPLANT
IMPL SILICONE (Ophthalmic Related) ×2 IMPLANT
IMPLANT SILICONE (Ophthalmic Related) ×6 IMPLANT
KIT BASIN OR (CUSTOM PROCEDURE TRAY) ×3 IMPLANT
KIT PERFLUORON PROCEDURE 5ML (MISCELLANEOUS) IMPLANT
KIT ROOM TURNOVER OR (KITS) ×3 IMPLANT
KNIFE CRESCENT 1.75 EDGEAHEAD (BLADE) IMPLANT
KNIFE GRIESHABER SHARP 2.5MM (MISCELLANEOUS) ×11 IMPLANT
MARKER SKIN DUAL TIP RULER LAB (MISCELLANEOUS) IMPLANT
MASK EYE SHIELD (GAUZE/BANDAGES/DRESSINGS) ×1 IMPLANT
NDL 18GX1X1/2 (RX/OR ONLY) (NEEDLE) ×2 IMPLANT
NDL 25GX 5/8IN NON SAFETY (NEEDLE) IMPLANT
NDL HYPO 30X.5 LL (NEEDLE) ×4 IMPLANT
NEEDLE 18GX1X1/2 (RX/OR ONLY) (NEEDLE) ×6 IMPLANT
NEEDLE 25GX 5/8IN NON SAFETY (NEEDLE) IMPLANT
NEEDLE 27GAX1X1/2 (NEEDLE) IMPLANT
NEEDLE HYPO 30X.5 LL (NEEDLE) ×3 IMPLANT
NS IRRIG 1000ML POUR BTL (IV SOLUTION) ×3 IMPLANT
PACK VITRECTOMY CUSTOM (CUSTOM PROCEDURE TRAY) ×3 IMPLANT
PAD ARMBOARD 7.5X6 YLW CONV (MISCELLANEOUS) ×5 IMPLANT
PAD EYE OVAL STERILE LF (GAUZE/BANDAGES/DRESSINGS) ×1 IMPLANT
PAK VITRECTOMY PIK 25 GA (OPHTHALMIC RELATED) IMPLANT
PROBE DIRECTIONAL LASER (MISCELLANEOUS) IMPLANT
REPL STRA BRUSH NDL (NEEDLE) IMPLANT
REPL STRA BRUSH NEEDLE (NEEDLE) IMPLANT
RESERVOIR BACK FLUSH (MISCELLANEOUS) IMPLANT
ROLLS DENTAL (MISCELLANEOUS) ×6 IMPLANT
SET FLUID INJECTOR (SET/KITS/TRAYS/PACK) IMPLANT
SET VGFI TUBING 8065808002 (SET/KITS/TRAYS/PACK) IMPLANT
SLEEVE SCLERAL TYPE 270 (Ophthalmic Related) ×1 IMPLANT
SLEEVE SURGEON STRL (DRAPES) ×1 IMPLANT
SPEAR EYE SURG WECK-CEL (MISCELLANEOUS) ×3 IMPLANT
SPONGE GROOVED SILICONE 4X12X8 (Ophthalmic Related) ×1 IMPLANT
SPONGE SURGIFOAM ABS GEL 12-7 (HEMOSTASIS) IMPLANT
STOPCOCK 4 WAY LG BORE MALE ST (IV SETS) IMPLANT
SUT CHROMIC 7 0 TG140 8 (SUTURE) ×3 IMPLANT
SUT ETHILON 9 0 TG140 8 (SUTURE) IMPLANT
SUT MERSILENE 4 0 RV 2 (SUTURE) ×6 IMPLANT
SUT SILK 2 0 (SUTURE) ×3
SUT SILK 2-0 18XBRD TIE 12 (SUTURE) ×2 IMPLANT
SUT SILK 4 0 RB 1 (SUTURE) ×3 IMPLANT
SUT VICRYL 7 0 TG140 8 (SUTURE) IMPLANT
SYR 20CC LL (SYRINGE) ×3 IMPLANT
SYR 50ML LL SCALE MARK (SYRINGE) IMPLANT
SYR 5ML LL (SYRINGE) ×3 IMPLANT
SYR BULB 3OZ (MISCELLANEOUS) ×3 IMPLANT
SYR TB 1ML LUER SLIP (SYRINGE) IMPLANT
SYRINGE 10CC LL (SYRINGE) ×3 IMPLANT
TIRE RTNL 2.5XGRV CNCV 9X (Ophthalmic Related) IMPLANT
TOWEL OR 17X24 6PK STRL BLUE (TOWEL DISPOSABLE) ×9 IMPLANT
TUBING ART PRESS 12 MALE/MALE (MISCELLANEOUS) IMPLANT
VITREORETINAL VISCODISSEC (MISCELLANEOUS) IMPLANT
WATER STERILE IRR 1000ML POUR (IV SOLUTION) ×3 IMPLANT
WIPE INSTRUMENT VISIWIPE 73X73 (MISCELLANEOUS) ×3 IMPLANT

## 2012-10-23 NOTE — H&P (Signed)
I examined the patient today and there is no change in the medical status 

## 2012-10-23 NOTE — Anesthesia Preprocedure Evaluation (Signed)
Anesthesia Evaluation  Patient identified by MRN, date of birth, ID band Patient awake    Reviewed: Allergy & Precautions, H&P , NPO status , Patient's Chart, lab work & pertinent test results  Airway Mallampati: I TM Distance: >3 FB Neck ROM: full    Dental   Pulmonary sleep apnea , COPD         Cardiovascular Rhythm:regular Rate:Normal     Neuro/Psych    GI/Hepatic   Endo/Other    Renal/GU      Musculoskeletal   Abdominal   Peds  Hematology   Anesthesia Other Findings   Reproductive/Obstetrics                           Anesthesia Physical Anesthesia Plan  ASA: III  Anesthesia Plan: General   Post-op Pain Management:    Induction: Intravenous  Airway Management Planned: Oral ETT  Additional Equipment:   Intra-op Plan:   Post-operative Plan: Extubation in OR  Informed Consent: I have reviewed the patients History and Physical, chart, labs and discussed the procedure including the risks, benefits and alternatives for the proposed anesthesia with the patient or authorized representative who has indicated his/her understanding and acceptance.     Plan Discussed with: CRNA, Anesthesiologist and Surgeon  Anesthesia Plan Comments:         Anesthesia Quick Evaluation

## 2012-10-23 NOTE — Brief Op Note (Signed)
10/23/2012  3:43 PM  PATIENT:  Bertha Stakes  72 y.o. male  PRE-OPERATIVE DIAGNOSIS:  retinal detachment right eye  POST-OPERATIVE DIAGNOSIS:  retinal detachment right eye  PROCEDURE:  Procedure(s) (LRB) with comments: SCLERAL BUCKLE (Right) INSERTION OF GAS (Right) - C3F8 PHOTOCOAGULATION WITH LASER (Right)  SURGEON:  Surgeon(s) and Role:    * Sherrie George, MD - Primary  Brief Operative note   Preoperative diagnosis:  Pre-Op Diagnosis Codes:    * Retinal detachment with retinal defect, unspecified [361.00] Postoperative diagnosis  Post-Op Diagnosis Codes:    * Retinal detachment with retinal defect, unspecified [361.00]    Surgeon:  Sherrie George, MD...  Assistant:  Rosalie Doctor SA    Anesthesia: General  Specimen: none  Estimated blood loss:  1cc  Complications: none  Patient sent to PACU in good condition  Composed by Sherrie George MD  Dictation number: [phone]

## 2012-10-23 NOTE — Anesthesia Postprocedure Evaluation (Signed)
  Anesthesia Post-op Note  Patient: Steve [name]  Procedure(s) Performed: Procedure(s) (LRB) with comments: SCLERAL BUCKLE (Right) INSERTION OF GAS (Right) - C3F8 PHOTOCOAGULATION WITH LASER (Right)  Patient Location: PACU  Anesthesia Type:General  Level of Consciousness: awake, oriented and patient cooperative  Airway and Oxygen Therapy: Patient Spontanous Breathing and Patient connected to nasal cannula oxygen  Post-op Pain: mild  Post-op Assessment: Post-op Vital signs reviewed, Patient's Cardiovascular Status Stable, Respiratory Function Stable, Patent Airway, No signs of Nausea or vomiting and Pain level controlled  Post-op Vital Signs: Reviewed and stable  Complications: No apparent anesthesia complications

## 2012-10-23 NOTE — Transfer of Care (Signed)
Immediate Anesthesia Transfer of Care Note  Patient: Steve [name]  Procedure(s) Performed: Procedure(s) (LRB) with comments: SCLERAL BUCKLE (Right) INSERTION OF GAS (Right) - C3F8 PHOTOCOAGULATION WITH LASER (Right)  Patient Location: PACU  Anesthesia Type:General  Level of Consciousness: awake, alert  and sedated  Airway & Oxygen Therapy: Patient Spontanous Breathing and Patient connected to nasal cannula oxygen  Post-op Assessment: Report given to PACU RN, Post -op Vital signs reviewed and stable, Patient moving all extremities and Patient moving all extremities X 4  Post vital signs: Reviewed and stable  Complications: No apparent anesthesia complications

## 2012-10-24 ENCOUNTER — Ambulatory Visit (INDEPENDENT_AMBULATORY_CARE_PROVIDER_SITE_OTHER): Payer: Medicare Other | Admitting: Ophthalmology

## 2012-10-24 MED ORDER — PREDNISOLONE ACETATE 1 % OP SUSP: 1.0000 [drp] | Freq: Four times a day (QID) | OPHTHALMIC | Status: DC

## 2012-10-24 MED ORDER — BRIMONIDINE TARTRATE 0.2 % OP SOLN: 1.0000 [drp] | Freq: Two times a day (BID) | OPHTHALMIC | Status: DC

## 2012-10-24 MED ORDER — HYDROCODONE-ACETAMINOPHEN 5-325 MG PO TABS: 1.0000 | ORAL_TABLET | ORAL | Status: DC | PRN

## 2012-10-24 MED ORDER — GATIFLOXACIN 0.5 % OP SOLN: 1.0000 [drp] | Freq: Four times a day (QID) | OPHTHALMIC | Status: DC

## 2012-10-24 MED ORDER — BACITRACIN-POLYMYXIN B 500-10000 UNIT/GM OP OINT: 1.0000 "application " | TOPICAL_OINTMENT | Freq: Four times a day (QID) | OPHTHALMIC | Status: DC

## 2012-10-24 NOTE — Op Note (Signed)
NAMEALVARO, AUNGST NO.:  [id number]  MEDICAL RECORD NO.:  [id number]  LOCATION:  [id number]                        FACILITY:  [facility]  PHYSICIAN:  Beulah Gandy. Ashley Royalty, M.D. DATE OF BIRTH:  03-Feb-1941  DATE OF PROCEDURE:  10/23/2012 DATE OF DISCHARGE:                              OPERATIVE REPORT   ADMISSION DIAGNOSIS:  Rhegmatogenous retinal detachment, right eye, proliferative vitreal retinopathy, right eye.  PROCEDURES:  Scleral buckle, right eye.  Retinal photocoagulation, right eye.  Gas fluid exchange, right eye.  SURGEON:  Beulah Gandy. Ashley Royalty, MD  ASSISTANT:  Rosalie Doctor, SA  ANESTHESIA:  General.  DETAILS:  Usual prep and drape, 360 degree limbal peritomy, isolation of 4 rectus muscles on 2-0 silk.  Scleral dissection for 360 degrees to admit a #279 intrascleral implant.  Large staphylomas were seen in the upper temporal quadrant and mini flaps were made around these weak areas.  Diathermy was placed in the bed.  The 279 implant with 1 mm trim from the posterior edge was placed around the globe with the joint at 1 o'clock, 240 band placed around the globe with a 270 sleeve at 1 o'clock.  Perforation was chosen at 9 o'clock in the posterior aspect of the bed, a large amount of clear, colorless subretinal fluid came forth. A 508G radial segment was placed beneath the break at 5 o'clock.  A 508G was placed against the globe beneath the scleral buccal, 2 sutures per quadrant for total of 8 scleral sutures were placed in the scleral flaps.  The two in the upper temporal quadrant were placed posteriorly, C3F8, 1.7 mL 50% was injected into the vitreous cavity to reinflate the globe.  The retina became reattached at this point.  The buckle was adjusted and trimmed, the band was adjusted and trimmed.  The sutures were knotted and the free ends removed.  Indirect ophthalmoscopy showed the retina to be lying nicely on the scleral buckle with minimal subretinal fluid  remaining.  The break was well supported.  The indirect ophthalmoscope laser was moved into place, 1070 burns were placed around the retinal break and on the scleral buckle for 360 degrees.  The power was between 600 and 900 mW, 1000 microns each and 0.1 seconds each.  The conjunctiva was reposited with 7-0 chromic suture.  Polymyxin and gentamicin were irrigated into tenon space.  Atropine solution was applied.  Closing pressure was 10 with a Baer care tonometer.  COMPLICATIONS:  None.  Patch and shield were placed.  The patient was awakened, and taken to recovery in satisfactory condition.  OPERATIVE TIME:  2 hours and 30 minutes.     Beulah Gandy. Ashley Royalty, M.D.     JDM/MEDQ  D:  10/23/2012  T:  10/24/2012  Job:  [id number]

## 2012-10-24 NOTE — Progress Notes (Signed)
10/24/2012, 6:43 AM  Mental Status:  Awake, Alert, Oriented  Anterior segment: Cornea  Clear    Anterior Chamber Clear    Lens:   cataract  Intra Ocular Pressure 22 mmHg with Tonopen  Vitreous: Clear 60%gas bubble   Retina:  Attached Good laser reaction   Impression: Excellent result Retina attached   Final Diagnosis: Principal Problem:  *Rhegmatogenous retinal detachment Active Problems:  Proliferative vitreoretinopathy of right eye   Plan: start post operative eye drops.  Discharge to home.  Give post operative instructions  Sherrie George 10/24/2012, 6:43 AM

## 2012-10-24 NOTE — Discharge Summary (Signed)
Discharge summary not needed on [facility] patients per medical records. 

## 2012-10-24 NOTE — Progress Notes (Signed)
D/C instructions reviewed with pt and family at bedside. Copy of instructions given to pt/family. eye meds and script given to pt by MD this am. Pt d/c'd via wheelchair with family and with belongings,  escorted by hospital volunteer.

## 2012-10-26 ENCOUNTER — Encounter (HOSPITAL_COMMUNITY): Payer: Self-pay | Admitting: Ophthalmology

## 2012-11-01 ENCOUNTER — Ambulatory Visit (INDEPENDENT_AMBULATORY_CARE_PROVIDER_SITE_OTHER): Payer: Medicare Other | Admitting: Ophthalmology

## 2012-11-01 DIAGNOSIS — H33009 Unspecified retinal detachment with retinal break, unspecified eye: Secondary | ICD-10-CM

## 2012-11-26 ENCOUNTER — Encounter (INDEPENDENT_AMBULATORY_CARE_PROVIDER_SITE_OTHER): Payer: Medicare Other | Admitting: Ophthalmology

## 2012-11-26 DIAGNOSIS — H33009 Unspecified retinal detachment with retinal break, unspecified eye: Secondary | ICD-10-CM

## 2012-11-26 NOTE — H&P (Signed)
Steve [name] is an 72 y.o. male.   Chief Complaint: loss of vision right eye  HPI: chronic retinal detachment with recent scleral buckle.  Now has progressive traction retinal detachment right eye  Past Medical History  Diagnosis Date  . History of urinary retention   . History of urethral stricture   . Bladder neck contracture   . Peyronie disease   . History of prostate cancer S/P SEED IMPLANTS   . COPD (chronic obstructive pulmonary disease) PER CXRAY  10-28-2010  . Urinary incontinence   . Nocturia   . Frequency of urination   . OSA (obstructive sleep apnea) NON-COMPLIANT    does not use cpap    Past Surgical History  Procedure Laterality Date  . Cysto/ urethral dilation/  urethra balloon dilation / vaporization of prostate and urethral stricture  10-28-2010  . Cysto/ urethral dilation  04-21-2008  . Radioactive seed implants, prostate  06-20-2002  . Cataract extraction w/ intraocular lens  implant, bilateral    . Prostaglandin injection  2003  . Cystoscopy  01/27/2012    Procedure: CYSTOSCOPY;  Surgeon: Kathi Ludwig, MD;  Location: [facility];  Service: Urology;  Laterality: N/A;  cysto, cook balloon dilation of  bladder neck contracture gyrus button vaporization  carm   . Transurethral resection of bladder tumor  05/15/2012    Procedure: TRANSURETHRAL RESECTION OF BLADDER TUMOR (TURBT);  Surgeon: Kathi Ludwig, MD;  Location: [facility];  Service: Urology;  Laterality: N/A;  . Eye surgery      cataract removed bilaterally  . Scleral buckle  10/23/2012    Procedure: SCLERAL BUCKLE;  Surgeon: Sherrie George, MD;  [facility];  Service: Ophthalmology;  Laterality: Right;  . Gas insertion  10/23/2012    Procedure: INSERTION OF GAS;  Surgeon: Sherrie George, MD;  Location[facility];  Service: Ophthalmology;  Laterality: Right;  C3F8  . Photocoagulation with laser  10/23/2012    Procedure: PHOTOCOAGULATION WITH LASER;  Surgeon: Sherrie George, MD;   Location: [facility] OR;  Service: Ophthalmology;  Laterality: Right;    No family history on file. Social History:  reports that he quit smoking about 31 years ago. His smoking use included Cigarettes. He smoked 0.00 packs per day for 20 years. He has never used smokeless tobacco. He reports that he does not drink alcohol or use illicit drugs.  Allergies: No Known Allergies  No prescriptions prior to admission    Review of systems otherwise negative  There were no vitals taken for this visit.  Physical exam: Mental status: oriented x3. Eyes: See eye exam associated with this date of surgery in media tab.  Scanned in by scanning center Ears, Nose, Throat: within normal limits Neck: Within Normal limits General: within normal limits Chest: Within normal limits Breast: deferred Heart: Within normal limits Abdomen: Within normal limits GU: deferred Extremities: within normal limits Skin: within normal limits  Assessment/Plan chronic retinal detachment with recent scleral buckle.  Now has progressive traction retinal detachment right eye  Plan: To [facility] for Pars plana vitrectomy, pfo, possible silicone oil, membrane peel, laser, gas injection. Right eye  Sherrie George 11/26/2012, 5:32 PM

## 2012-11-28 ENCOUNTER — Encounter (HOSPITAL_COMMUNITY): Payer: Self-pay | Admitting: Pharmacy Technician

## 2012-12-10 ENCOUNTER — Encounter (HOSPITAL_COMMUNITY): Payer: Self-pay | Admitting: *Deleted

## 2012-12-10 MED ORDER — CEFAZOLIN SODIUM-DEXTROSE 2-3 GM-% IV SOLR
2.0000 g | INTRAVENOUS | Status: AC
  Administered 2012-12-11: 2 g via INTRAVENOUS
  Filled 2012-12-10: qty 50

## 2012-12-11 ENCOUNTER — Encounter (HOSPITAL_COMMUNITY): Payer: Self-pay | Admitting: *Deleted

## 2012-12-11 ENCOUNTER — Ambulatory Visit (HOSPITAL_COMMUNITY): Payer: Medicare Other | Admitting: Anesthesiology

## 2012-12-11 ENCOUNTER — Encounter (INDEPENDENT_AMBULATORY_CARE_PROVIDER_SITE_OTHER): Payer: Medicare Other | Admitting: Ophthalmology

## 2012-12-11 ENCOUNTER — Ambulatory Visit (HOSPITAL_COMMUNITY)
Admission: RE | Admit: 2012-12-11 | Discharge: 2012-12-12 | Disposition: A | Discharge status: Home / Self Care | Payer: Medicare Other | Source: Ambulatory Visit | Attending: Ophthalmology | Admitting: Ophthalmology

## 2012-12-11 ENCOUNTER — Ambulatory Visit (HOSPITAL_COMMUNITY): Payer: Medicare Other

## 2012-12-11 ENCOUNTER — Encounter (HOSPITAL_COMMUNITY)
Admission: RE | Disposition: A | Discharge status: Home / Self Care | Payer: Self-pay | Source: Ambulatory Visit | Attending: Ophthalmology

## 2012-12-11 ENCOUNTER — Encounter (HOSPITAL_COMMUNITY): Payer: Self-pay | Admitting: Anesthesiology

## 2012-12-11 DIAGNOSIS — H3520 Other non-diabetic proliferative retinopathy, unspecified eye: Secondary | ICD-10-CM

## 2012-12-11 DIAGNOSIS — J4489 Other specified chronic obstructive pulmonary disease: Secondary | ICD-10-CM | POA: Insufficient documentation

## 2012-12-11 DIAGNOSIS — H33009 Unspecified retinal detachment with retinal break, unspecified eye: Secondary | ICD-10-CM | POA: Diagnosis not present

## 2012-12-11 DIAGNOSIS — H3340 Traction detachment of retina, unspecified eye: Secondary | ICD-10-CM | POA: Diagnosis not present

## 2012-12-11 DIAGNOSIS — J449 Chronic obstructive pulmonary disease, unspecified: Secondary | ICD-10-CM | POA: Diagnosis not present

## 2012-12-11 HISTORY — PX: IRIDECTOMY: SHX1848

## 2012-12-11 HISTORY — DX: Other specified postprocedural states: Z98.890

## 2012-12-11 HISTORY — PX: LASER PHOTO ABLATION: SHX5942

## 2012-12-11 HISTORY — PX: PERFLUORONE INJECTION: SHX5302

## 2012-12-11 HISTORY — PX: 25 GAUGE PARS PLANA VITRECTOMY WITH 20 GAUGE MVR PORT: SHX6041

## 2012-12-11 HISTORY — DX: Nausea with vomiting, unspecified: R11.2

## 2012-12-11 HISTORY — PX: MEMBRANE PEEL: SHX5967

## 2012-12-11 HISTORY — DX: Other complications of anesthesia, initial encounter: T88.59XA

## 2012-12-11 HISTORY — DX: Adverse effect of unspecified anesthetic, initial encounter: T41.45XA

## 2012-12-11 HISTORY — PX: GAS/FLUID EXCHANGE: SHX5334

## 2012-12-11 LAB — GLUCOSE, CAPILLARY: Glucose-Capillary: 82 mg/dL (ref 70–99)

## 2012-12-11 LAB — CBC
HCT: 40.8 % (ref 39.0–52.0)
MCHC: 35.3 g/dL (ref 30.0–36.0)
MCV: 88.5 fL (ref 78.0–100.0)
RDW: 13.4 % (ref 11.5–15.5)

## 2012-12-11 SURGERY — 25 GAUGE PARS PLANA VITRECTOMY WITH 20 GAUGE MVR PORT
Anesthesia: General | Site: Eye | Laterality: Right

## 2012-12-11 MED ORDER — BSS IO SOLN
INTRAOCULAR | Status: DC | PRN
  Administered 2012-12-11: 15 mL via INTRAOCULAR

## 2012-12-11 MED ORDER — BACITRACIN-POLYMYXIN B 500-10000 UNIT/GM OP OINT
TOPICAL_OINTMENT | OPHTHALMIC | Status: AC
  Filled 2012-12-11: qty 3.5

## 2012-12-11 MED ORDER — FENTANYL CITRATE 0.05 MG/ML IJ SOLN
INTRAMUSCULAR | Status: AC
  Filled 2012-12-11: qty 2

## 2012-12-11 MED ORDER — ACETAZOLAMIDE SODIUM 500 MG IJ SOLR
INTRAMUSCULAR | Status: AC
  Filled 2012-12-11: qty 500

## 2012-12-11 MED ORDER — ATROPINE SULFATE 1 % OP SOLN
OPHTHALMIC | Status: AC
  Filled 2012-12-11: qty 2

## 2012-12-11 MED ORDER — EPINEPHRINE HCL 1 MG/ML IJ SOLN
INTRAMUSCULAR | Status: DC | PRN
  Administered 2012-12-11: 12:00:00

## 2012-12-11 MED ORDER — DEXAMETHASONE SODIUM PHOSPHATE 10 MG/ML IJ SOLN
INTRAMUSCULAR | Status: DC | PRN
  Administered 2012-12-11: 8 mg via INTRAVENOUS

## 2012-12-11 MED ORDER — BACITRACIN-POLYMYXIN B 500-10000 UNIT/GM OP OINT
1.0000 "application " | TOPICAL_OINTMENT | Freq: Four times a day (QID) | OPHTHALMIC | Status: DC
  Filled 2012-12-11 (×2): qty 3.5

## 2012-12-11 MED ORDER — TETRACAINE HCL 0.5 % OP SOLN
2.0000 [drp] | Freq: Once | OPHTHALMIC | Status: DC
  Filled 2012-12-11: qty 2

## 2012-12-11 MED ORDER — DEXAMETHASONE SODIUM PHOSPHATE 10 MG/ML IJ SOLN
INTRAMUSCULAR | Status: AC
  Filled 2012-12-11: qty 1

## 2012-12-11 MED ORDER — EPINEPHRINE HCL 1 MG/ML IJ SOLN
INTRAOCULAR | Status: DC | PRN
  Administered 2012-12-11: 12:00:00

## 2012-12-11 MED ORDER — SODIUM HYALURONATE 10 MG/ML IO SOLN
INTRAOCULAR | Status: AC
  Filled 2012-12-11: qty 0.85

## 2012-12-11 MED ORDER — ACETAZOLAMIDE SODIUM 500 MG IJ SOLR
500.0000 mg | Freq: Once | INTRAMUSCULAR | Status: AC
  Administered 2012-12-12: 500 mg via INTRAVENOUS
  Filled 2012-12-11: qty 500

## 2012-12-11 MED ORDER — HYALURONIDASE HUMAN 150 UNIT/ML IJ SOLN
INTRAMUSCULAR | Status: AC
  Filled 2012-12-11: qty 1

## 2012-12-11 MED ORDER — PREDNISOLONE ACETATE 1 % OP SUSP: 1.0000 [drp] | Freq: Four times a day (QID) | OPHTHALMIC | Status: DC

## 2012-12-11 MED ORDER — NEOSTIGMINE METHYLSULFATE 1 MG/ML IJ SOLN
INTRAMUSCULAR | Status: DC | PRN
  Administered 2012-12-11: 3 mg via INTRAVENOUS

## 2012-12-11 MED ORDER — FENTANYL CITRATE 0.05 MG/ML IJ SOLN
INTRAMUSCULAR | Status: DC | PRN
  Administered 2012-12-11: 50 ug via INTRAVENOUS
  Administered 2012-12-11: 100 ug via INTRAVENOUS

## 2012-12-11 MED ORDER — 0.9 % SODIUM CHLORIDE (POUR BTL) OPTIME
TOPICAL | Status: DC | PRN
  Administered 2012-12-11: 1000 mL

## 2012-12-11 MED ORDER — HYPROMELLOSE (GONIOSCOPIC) 2.5 % OP SOLN
OPHTHALMIC | Status: AC
  Filled 2012-12-11: qty 15

## 2012-12-11 MED ORDER — PHENYLEPHRINE HCL 2.5 % OP SOLN
1.0000 [drp] | OPHTHALMIC | Status: AC | PRN
  Administered 2012-12-11 (×3): 1 [drp] via OPHTHALMIC
  Filled 2012-12-11: qty 3

## 2012-12-11 MED ORDER — GATIFLOXACIN 0.5 % OP SOLN
1.0000 [drp] | OPHTHALMIC | Status: AC | PRN
  Administered 2012-12-11 (×3): 1 [drp] via OPHTHALMIC
  Filled 2012-12-11: qty 2.5

## 2012-12-11 MED ORDER — SODIUM CHLORIDE 0.45 % IV SOLN
INTRAVENOUS | Status: DC
  Administered 2012-12-11: 10 mL/h via INTRAVENOUS

## 2012-12-11 MED ORDER — PREDNISOLONE ACETATE 1 % OP SUSP
1.0000 [drp] | Freq: Four times a day (QID) | OPHTHALMIC | Status: DC
  Filled 2012-12-11: qty 1

## 2012-12-11 MED ORDER — ONDANSETRON HCL 4 MG/2ML IJ SOLN: 4.0000 mg | Freq: Four times a day (QID) | INTRAMUSCULAR | Status: DC | PRN

## 2012-12-11 MED ORDER — PROVISC 10 MG/ML IO SOLN
INTRAOCULAR | Status: DC | PRN
  Administered 2012-12-11: .85 mL via INTRAOCULAR

## 2012-12-11 MED ORDER — BUPIVACAINE HCL (PF) 0.75 % IJ SOLN
INTRAMUSCULAR | Status: AC
  Filled 2012-12-11: qty 10

## 2012-12-11 MED ORDER — GENTAMICIN SULFATE 40 MG/ML IJ SOLN
INTRAMUSCULAR | Status: AC
  Filled 2012-12-11: qty 2

## 2012-12-11 MED ORDER — PROPOFOL 10 MG/ML IV BOLUS
INTRAVENOUS | Status: DC | PRN
  Administered 2012-12-11: 150 mg via INTRAVENOUS

## 2012-12-11 MED ORDER — LIDOCAINE HCL (CARDIAC) 20 MG/ML IV SOLN
INTRAVENOUS | Status: DC | PRN
  Administered 2012-12-11: 50 mg via INTRAVENOUS

## 2012-12-11 MED ORDER — GLYCOPYRROLATE 0.2 MG/ML IJ SOLN
INTRAMUSCULAR | Status: DC | PRN
  Administered 2012-12-11: 0.2 mg via INTRAVENOUS
  Administered 2012-12-11: 0.4 mg via INTRAVENOUS

## 2012-12-11 MED ORDER — MAGNESIUM HYDROXIDE 400 MG/5ML PO SUSP: 15.0000 mL | Freq: Four times a day (QID) | ORAL | Status: DC | PRN

## 2012-12-11 MED ORDER — LATANOPROST 0.005 % OP SOLN
1.0000 [drp] | Freq: Every day | OPHTHALMIC | Status: DC
  Filled 2012-12-11: qty 2.5

## 2012-12-11 MED ORDER — MUPIROCIN 2 % EX OINT
TOPICAL_OINTMENT | Freq: Two times a day (BID) | CUTANEOUS | Status: DC
  Administered 2012-12-11: 1 via NASAL
  Filled 2012-12-11: qty 22

## 2012-12-11 MED ORDER — PROMETHAZINE HCL 25 MG/ML IJ SOLN: 6.2500 mg | INTRAMUSCULAR | Status: DC | PRN

## 2012-12-11 MED ORDER — ACETAMINOPHEN 325 MG PO TABS: 325.0000 mg | ORAL_TABLET | ORAL | Status: DC | PRN

## 2012-12-11 MED ORDER — DOCUSATE SODIUM 100 MG PO CAPS
100.0000 mg | ORAL_CAPSULE | Freq: Two times a day (BID) | ORAL | Status: DC
  Administered 2012-12-11: 100 mg via ORAL
  Filled 2012-12-11: qty 1

## 2012-12-11 MED ORDER — GATIFLOXACIN 0.5 % OP SOLN
1.0000 [drp] | Freq: Four times a day (QID) | OPHTHALMIC | Status: DC
  Filled 2012-12-11: qty 2.5

## 2012-12-11 MED ORDER — MINERAL OIL LIGHT 100 % EX OIL
TOPICAL_OIL | CUTANEOUS | Status: AC
  Filled 2012-12-11: qty 25

## 2012-12-11 MED ORDER — BUPIVACAINE HCL (PF) 0.75 % IJ SOLN
INTRAMUSCULAR | Status: DC | PRN
  Administered 2012-12-11: 10 mL

## 2012-12-11 MED ORDER — SODIUM CHLORIDE 0.9 % IJ SOLN
INTRAMUSCULAR | Status: DC | PRN
  Administered 2012-12-11: 12:00:00

## 2012-12-11 MED ORDER — BSS PLUS IO SOLN
INTRAOCULAR | Status: AC
  Filled 2012-12-11: qty 500

## 2012-12-11 MED ORDER — TROPICAMIDE 1 % OP SOLN
1.0000 [drp] | OPHTHALMIC | Status: AC | PRN
  Administered 2012-12-11 (×3): 1 [drp] via OPHTHALMIC
  Filled 2012-12-11: qty 3

## 2012-12-11 MED ORDER — DEXAMETHASONE SODIUM PHOSPHATE 10 MG/ML IJ SOLN
INTRAMUSCULAR | Status: DC | PRN
  Administered 2012-12-11: 10 mg

## 2012-12-11 MED ORDER — MIDAZOLAM HCL 2 MG/2ML IJ SOLN: 1.0000 mg | INTRAMUSCULAR | Status: DC | PRN

## 2012-12-11 MED ORDER — ONDANSETRON HCL 4 MG/2ML IJ SOLN
INTRAMUSCULAR | Status: DC | PRN
  Administered 2012-12-11: 4 mg via INTRAVENOUS

## 2012-12-11 MED ORDER — MORPHINE SULFATE 2 MG/ML IJ SOLN
1.0000 mg | INTRAMUSCULAR | Status: DC | PRN
  Filled 2012-12-11: qty 1

## 2012-12-11 MED ORDER — HEMOSTATIC AGENTS (NO CHARGE) OPTIME
TOPICAL | Status: DC | PRN
  Administered 2012-12-11: 1 via TOPICAL

## 2012-12-11 MED ORDER — SODIUM CHLORIDE 0.9 % IV SOLN
INTRAVENOUS | Status: DC
  Administered 2012-12-11 (×2): via INTRAVENOUS

## 2012-12-11 MED ORDER — CYCLOPENTOLATE HCL 1 % OP SOLN
1.0000 [drp] | OPHTHALMIC | Status: AC | PRN
  Administered 2012-12-11 (×3): 1 [drp] via OPHTHALMIC
  Filled 2012-12-11: qty 2

## 2012-12-11 MED ORDER — BRIMONIDINE TARTRATE 0.2 % OP SOLN
1.0000 [drp] | Freq: Two times a day (BID) | OPHTHALMIC | Status: DC
  Filled 2012-12-11: qty 5

## 2012-12-11 MED ORDER — FENTANYL CITRATE 0.05 MG/ML IJ SOLN
25.0000 ug | INTRAMUSCULAR | Status: DC | PRN
  Administered 2012-12-11 (×4): 25 ug via INTRAVENOUS

## 2012-12-11 MED ORDER — TEMAZEPAM 15 MG PO CAPS: 15.0000 mg | ORAL_CAPSULE | Freq: Every evening | ORAL | Status: DC | PRN

## 2012-12-11 MED ORDER — ROCURONIUM BROMIDE 100 MG/10ML IV SOLN
INTRAVENOUS | Status: DC | PRN
  Administered 2012-12-11: 50 mg via INTRAVENOUS

## 2012-12-11 MED ORDER — POLYMYXIN B SULFATE 500000 UNITS IJ SOLR
INTRAMUSCULAR | Status: AC
  Filled 2012-12-11: qty 1

## 2012-12-11 MED ORDER — HYDROCODONE-ACETAMINOPHEN 5-325 MG PO TABS
1.0000 | ORAL_TABLET | ORAL | Status: DC | PRN
  Administered 2012-12-11: 1 via ORAL
  Administered 2012-12-11: 2 via ORAL
  Filled 2012-12-11: qty 2
  Filled 2012-12-11: qty 1

## 2012-12-11 MED ORDER — FENTANYL CITRATE 0.05 MG/ML IJ SOLN: 50.0000 ug | Freq: Once | INTRAMUSCULAR | Status: DC

## 2012-12-11 MED ORDER — EPINEPHRINE HCL 1 MG/ML IJ SOLN
INTRAMUSCULAR | Status: AC
  Filled 2012-12-11: qty 1

## 2012-12-11 MED ORDER — LIDOCAINE HCL 2 % IJ SOLN
INTRAMUSCULAR | Status: AC
  Filled 2012-12-11: qty 20

## 2012-12-11 SURGICAL SUPPLY — 75 items
APL SRG 3 HI ABS STRL LF PLS (MISCELLANEOUS)
APPLICATOR DR MATTHEWS STRL (MISCELLANEOUS) IMPLANT
BALL CTTN LRG ABS STRL LF (GAUZE/BANDAGES/DRESSINGS) ×3
BLADE EYE CATARACT 19 1.4 BEAV (BLADE) IMPLANT
BLADE MVR KNIFE 19G (BLADE) ×1 IMPLANT
BLADE MVR KNIFE 20G (BLADE) ×1 IMPLANT
CANNULA DUAL BORE 23G (CANNULA) ×1 IMPLANT
CANNULA FLEX TIP 25G (CANNULA) ×1 IMPLANT
CLOTH BEACON ORANGE TIMEOUT ST (SAFETY) ×2 IMPLANT
CORDS BIPOLAR (ELECTRODE) ×1 IMPLANT
COTTONBALL LRG STERILE PKG (GAUZE/BANDAGES/DRESSINGS) ×6 IMPLANT
DRAPE INCISE 51X51 W/FILM STRL (DRAPES) ×1 IMPLANT
DRAPE OPHTHALMIC 77X100 STRL (CUSTOM PROCEDURE TRAY) ×2 IMPLANT
FILTER BLUE MILLIPORE (MISCELLANEOUS) ×1 IMPLANT
FILTER STRAW FLUID ASPIR (MISCELLANEOUS) IMPLANT
FORCEPS ECKARDT ILM 25G SERR (OPHTHALMIC RELATED) ×1 IMPLANT
GLOVE SS BIOGEL STRL SZ 6.5 (GLOVE) ×1 IMPLANT
GLOVE SS BIOGEL STRL SZ 7 (GLOVE) ×1 IMPLANT
GLOVE SUPERSENSE BIOGEL SZ 6.5 (GLOVE) ×1
GLOVE SUPERSENSE BIOGEL SZ 7 (GLOVE) ×1
GLOVE SURG 8.5 LATEX PF (GLOVE) ×2 IMPLANT
GLOVE SURG SS PI 6.5 STRL IVOR (GLOVE) ×1 IMPLANT
GOWN STRL NON-REIN LRG LVL3 (GOWN DISPOSABLE) ×6 IMPLANT
ILLUMINATOR CHOW PICK 25GA (MISCELLANEOUS) ×2 IMPLANT
KIT BASIN OR (CUSTOM PROCEDURE TRAY) ×2 IMPLANT
KIT PERFLUORON PROCEDURE 5ML (MISCELLANEOUS) ×1 IMPLANT
KIT ROOM TURNOVER OR (KITS) ×2 IMPLANT
KNIFE CRESCENT 2.5 55 ANG (BLADE) IMPLANT
LENS BIOM SUPER VIEW SET DISP (OPHTHALMIC RELATED) IMPLANT
MARKER SKIN DUAL TIP RULER LAB (MISCELLANEOUS) IMPLANT
MASK EYE SHIELD (GAUZE/BANDAGES/DRESSINGS) ×1 IMPLANT
MICROPICK 25G (MISCELLANEOUS)
NDL 18GX1X1/2 (RX/OR ONLY) (NEEDLE) ×1 IMPLANT
NDL 25GX 5/8IN NON SAFETY (NEEDLE) ×1 IMPLANT
NDL FILTER BLUNT 18X1 1/2 (NEEDLE) ×1 IMPLANT
NDL HYPO 30X.5 LL (NEEDLE) ×1 IMPLANT
NEEDLE 18GX1X1/2 (RX/OR ONLY) (NEEDLE) ×2 IMPLANT
NEEDLE 25GX 5/8IN NON SAFETY (NEEDLE) ×2 IMPLANT
NEEDLE 27GAX1X1/2 (NEEDLE) IMPLANT
NEEDLE FILTER BLUNT 18X 1/2SAF (NEEDLE) ×1
NEEDLE FILTER BLUNT 18X1 1/2 (NEEDLE) ×1 IMPLANT
NEEDLE HYPO 30X.5 LL (NEEDLE) ×2 IMPLANT
NS IRRIG 1000ML POUR BTL (IV SOLUTION) ×2 IMPLANT
OIL SILICONE OPHTHALMIC ADAPTO (Ophthalmic Related) ×1 IMPLANT
PACK VITRECTOMY CUSTOM (CUSTOM PROCEDURE TRAY) ×2 IMPLANT
PAD ARMBOARD 7.5X6 YLW CONV (MISCELLANEOUS) ×4 IMPLANT
PAD EYE OVAL STERILE LF (GAUZE/BANDAGES/DRESSINGS) ×1 IMPLANT
PAK VITRECTOMY PIK 25 GA (OPHTHALMIC RELATED) ×2 IMPLANT
PENCIL BIPOLAR 25GA STR DISP (OPHTHALMIC RELATED) IMPLANT
PICK MICROPICK 25G (MISCELLANEOUS) IMPLANT
PROBE DIRECTIONAL LASER (MISCELLANEOUS) ×1 IMPLANT
REPL STRA BRUSH NDL (NEEDLE) IMPLANT
REPL STRA BRUSH NEEDLE (NEEDLE) ×2 IMPLANT
RESERVOIR BACK FLUSH (MISCELLANEOUS) IMPLANT
ROLLS DENTAL (MISCELLANEOUS) ×4 IMPLANT
SCRAPER DIAMOND DUST MEMBRANE (MISCELLANEOUS) ×1 IMPLANT
SET FLUID INJECTOR (SET/KITS/TRAYS/PACK) ×1 IMPLANT
SPONGE SURGIFOAM ABS GEL 12-7 (HEMOSTASIS) ×2 IMPLANT
STOPCOCK 4 WAY LG BORE MALE ST (IV SETS) IMPLANT
SUT CHROMIC 7 0 TG140 8 (SUTURE) IMPLANT
SUT ETHILON 10 0 CS140 6 (SUTURE) IMPLANT
SUT ETHILON 9 0 TG140 8 (SUTURE) ×1 IMPLANT
SUT POLY NON ABSORB 10-0 8 STR (SUTURE) IMPLANT
SUT SILK 4 0 RB 1 (SUTURE) IMPLANT
SYR 20CC LL (SYRINGE) ×2 IMPLANT
SYR 5ML LL (SYRINGE) IMPLANT
SYR BULB 3OZ (MISCELLANEOUS) ×2 IMPLANT
SYR TB 1ML LUER SLIP (SYRINGE) ×2 IMPLANT
SYRINGE 10CC LL (SYRINGE) IMPLANT
TAPE SURG TRANSPORE 1 IN (GAUZE/BANDAGES/DRESSINGS) IMPLANT
TAPE SURGICAL TRANSPORE 1 IN (GAUZE/BANDAGES/DRESSINGS) ×1
TOWEL OR 17X24 6PK STRL BLUE (TOWEL DISPOSABLE) ×6 IMPLANT
TROCAR CANNULA 25GA (CANNULA) IMPLANT
WATER STERILE IRR 1000ML POUR (IV SOLUTION) ×2 IMPLANT
WIPE INSTRUMENT VISIWIPE 73X73 (MISCELLANEOUS) ×2 IMPLANT

## 2012-12-11 NOTE — Transfer of Care (Signed)
Immediate Anesthesia Transfer of Care Note  Patient: Steve [name]  Procedure(s) Performed: Procedure(s) with comments: 25 GAUGE PARS PLANA VITRECTOMY WITH 20 GAUGE MVR PORT (Right) - Injection of silicone Oil MEMBRANE PEEL (Right) LASER PHOTO ABLATION (Right) IRIDECTOMY (Right) PERFLUORONE INJECTION (Right) - Injection and Removal GAS/FLUID EXCHANGE (Right)  Patient Location: PACU  Anesthesia Type:General  Level of Consciousness: sedated  Airway & Oxygen Therapy: Patient Spontanous Breathing and Patient connected to nasal cannula oxygen  Post-op Assessment: Post -op Vital signs reviewed and stable  Post vital signs: Reviewed and stable  Complications: No apparent anesthesia complications

## 2012-12-11 NOTE — Brief Op Note (Signed)
12/11/2012  1:01 PM  PATIENT:  Steve [name]  72 y.o. male  PRE-OPERATIVE DIAGNOSIS:  RECURRNT RETINAL DETACHMENT RIGHT EYE  POST-OPERATIVE DIAGNOSIS:  RECURRNT RETINAL DETACHMENT RIGHT EYE  PROCEDURE:  Procedure(s): 25 GAUGE PARS PLANA VITRECTOMY WITH 20 GAUGE MVR PORT (Right) MEMBRANE PEEL (Right) LASER PHOTO ABLATION (Right)  SURGEON:  Surgeon(s) and Role:    * Sherrie George, MD - Primary  Brief Operative note   Preoperative diagnosis:  Pre-Op Diagnosis Codes:    * Retinal detachment with retinal defect, unspecified [361.00] Postoperative diagnosis  Post-Op Diagnosis Codes:    * Retinal detachment with retinal defect, unspecified [361.00]  Procedures: @ORPROCAL @  Surgeon:  Sherrie George, MD...  Assistant:  Rosalie Doctor SA   Anesthesia: General  Specimen: none  Estimated blood loss:  1cc  Complications: none  Patient sent to PACU in good condition  Composed by Sherrie George MD  Dictation number: [phone]

## 2012-12-11 NOTE — Preoperative (Signed)
Beta Blockers   Reason not to administer Beta Blockers:Not Applicable 

## 2012-12-11 NOTE — Anesthesia Postprocedure Evaluation (Signed)
  Anesthesia Post-op Note  Patient: Steve [name]  Procedure(s) Performed: Procedure(s) with comments: 25 GAUGE PARS PLANA VITRECTOMY WITH 20 GAUGE MVR PORT (Right) - Injection of silicone Oil MEMBRANE PEEL (Right) LASER PHOTO ABLATION (Right) IRIDECTOMY (Right) PERFLUORONE INJECTION (Right) - Injection and Removal GAS/FLUID EXCHANGE (Right)  Patient Location: PACU  Anesthesia Type:General  Level of Consciousness: awake  Airway and Oxygen Therapy: Patient Spontanous Breathing  Post-op Pain: mild  Post-op Assessment: Post-op Vital signs reviewed, Patient's Cardiovascular Status Stable, Respiratory Function Stable, Patent Airway, No signs of Nausea or vomiting and Pain level controlled  Post-op Vital Signs: stable  Complications: No apparent anesthesia complications

## 2012-12-11 NOTE — Anesthesia Preprocedure Evaluation (Signed)
Anesthesia Evaluation  Patient identified by MRN, date of birth, ID band Patient awake    Reviewed: Allergy & Precautions, H&P , NPO status , Patient's Chart, lab work & pertinent test results  History of Anesthesia Complications (+) PONV  Airway Mallampati: I TM Distance: >3 FB Neck ROM: full    Dental   Pulmonary sleep apnea , COPD breath sounds clear to auscultation        Cardiovascular Rhythm:regular Rate:Normal     Neuro/Psych    GI/Hepatic   Endo/Other    Renal/GU      Musculoskeletal   Abdominal (+) + obese,   Peds  Hematology   Anesthesia Other Findings   Reproductive/Obstetrics                           Anesthesia Physical Anesthesia Plan  ASA: III  Anesthesia Plan: General   Post-op Pain Management:    Induction: Intravenous  Airway Management Planned: Oral ETT  Additional Equipment:   Intra-op Plan:   Post-operative Plan: Extubation in OR  Informed Consent: I have reviewed the patients History and Physical, chart, labs and discussed the procedure including the risks, benefits and alternatives for the proposed anesthesia with the patient or authorized representative who has indicated his/her understanding and acceptance.     Plan Discussed with: CRNA and Surgeon  Anesthesia Plan Comments:         Anesthesia Quick Evaluation

## 2012-12-11 NOTE — Anesthesia Procedure Notes (Signed)
Procedure Name: Intubation Date/Time: 12/11/2012 11:56 AM Performed by: Gwenyth Allegra Pre-anesthesia Checklist: Patient identified, Timeout performed, Emergency Drugs available, Suction available and Patient being monitored Patient Re-evaluated:Patient Re-evaluated prior to inductionOxygen Delivery Method: Circle system utilized Preoxygenation: Pre-oxygenation with 100% oxygen Intubation Type: IV induction Ventilation: Mask ventilation without difficulty and Oral airway inserted - appropriate to patient size Laryngoscope Size: Mac and 4 Grade View: Grade I Tube type: Oral Tube size: 7.5 mm Number of attempts: 1 Airway Equipment and Method: Stylet Placement Confirmation: ETT inserted through vocal cords under direct vision,  breath sounds checked- equal and bilateral and positive ETCO2 Secured at: 21 cm Tube secured with: Tape Dental Injury: Teeth and Oropharynx as per pre-operative assessment

## 2012-12-11 NOTE — H&P (Signed)
I examined the patient today and there is no change in the medical status 

## 2012-12-11 NOTE — Progress Notes (Signed)
Care of pt assumed from Devota Pace RN by Lenny Pastel  RN

## 2012-12-12 MED ORDER — BACITRACIN-POLYMYXIN B 500-10000 UNIT/GM OP OINT: 1.0000 "application " | TOPICAL_OINTMENT | Freq: Four times a day (QID) | OPHTHALMIC | Status: DC

## 2012-12-12 MED ORDER — GATIFLOXACIN 0.5 % OP SOLN: 1.0000 [drp] | Freq: Four times a day (QID) | OPHTHALMIC | Status: DC

## 2012-12-12 MED ORDER — PREDNISOLONE ACETATE 1 % OP SUSP: 1.0000 [drp] | Freq: Four times a day (QID) | OPHTHALMIC | Status: DC

## 2012-12-12 NOTE — Discharge Summary (Signed)
Discharge summary not needed on [facility] patients per medical records. 

## 2012-12-12 NOTE — Progress Notes (Signed)
12/12/2012, 6:54 AM  Mental Status:  Awake, Alert, Oriented  Anterior segment: Cornea  Clear    Anterior Chamber Clear    Lens:    IOL  Intra Ocular Pressure 18 mmHg with Tonopen  Vitreous: Silicone oil  Retina:  Attached Good laser reaction   Impression: Excellent result Retina attached Poor view  Final Diagnosis: Principal Problem:   Traction retinal detachment Combined traction retinal detachment and rhegmatogenous retinal detachment and proliferative vitreoretinopathy right eye  Plan: start post operative eye drops.  Discharge to home.  Give post operative instructions  Sherrie George 12/12/2012, 6:54 AM

## 2012-12-12 NOTE — Op Note (Signed)
Steve, [name] NO.:  [id number]  MEDICAL RECORD NO.:  [id number]  LOCATION:  [id number]                        FACILITY:  [facility]  PHYSICIAN:  Beulah Gandy. Ashley Royalty, M.D. DATE OF BIRTH:  [birth year]  DATE OF PROCEDURE:  12/11/2012 DATE OF DISCHARGE:                              OPERATIVE REPORT   ADMISSION DIAGNOSES:  Proliferative vitreoretinopathy, retinal detachment in the right eye.  PROCEDURES:  Repair of complex tractional retinal detachment and rhegmatogenous retinal detachment with pars plana vitrectomy, gas fluid exchange, membrane peel, retinal photocoagulation, Perfluoron injection, Perfluoron removal, silicone oil injection, all in the right eye.  SURGEON:  Beulah Gandy. Ashley Royalty, M.D.  ASSISTANT:  Rosalie Doctor, SA.  ANESTHESIA:  General.  DETAILS:  After usual prep and drape, 25-gauge trocars placed at 8 and 10 o'clock with MVR incision 19-gauge at 2 o'clock.  Provisc placed on the corneal surface and the biome viewing system was moved into place. Pars plana vitrectomy was begun just behind the pseudophakos.  A peripheral iridectomy was performed at 5 o'clock with the vitreous cutter.  Vitrectomy was carried posteriorly where large tight traction bands were seen in the vitreous cavity pulling the retina off the scleral buckle.  These traction bands were carefully removed under low suction and rapid cutting and trimmed down to the retinal surface.  The retinal surface was also peeled with the lighted pick, and 25-gauge forceps.  Once the retina was loosened, the wide field biome viewing system was moved into place and vitrectomy was carried out to the pars plana for 360 degrees removing all vitreous and scar material. Perfluoron was then infused to reattach the retina.  A full reattachment occurred with the retinal break lying properly on the scleral sponge. The endolaser was positioned in the eye, 639 burns were placed around the retinal periphery and  around the break, power was 200 mW, 1000 microns each and 0.1 seconds each.  The Perfluoron was removed and gas was inserted.  Additional Perfluoron was removed over the optic nerve and a BSS flush was used to be sure that all Perfluoron was removed from the eye.  Silicone oil was then injected into the vitreous cavity for permanent reattachment.  There was a total fill.  The globe was tested, it was felt and found to be of normal pressure.  The instruments were removed from the eye and 9-0 nylon was used to close the sclerotomy site.  The conjunctiva was closed with wet-field cautery.  Polymyxin and gentamicin were irrigated into tenon space.  Closing pressure was measured at 10 with a Baer keratometer.  Atropine solution was applied. Decadron 10 mg was injected into the lower subconjunctival space. Marcaine was injected around the globe for postop pain.  Polysporin ophthalmic ointment, a patch and shield were placed.  The patient was awakened and taken to recovery in satisfactory condition.     Beulah Gandy. Ashley Royalty, M.D.     JDM/MEDQ  D:  12/11/2012  T:  12/12/2012  Job:  [id number]

## 2012-12-15 ENCOUNTER — Encounter (HOSPITAL_COMMUNITY): Payer: Self-pay | Admitting: Ophthalmology

## 2012-12-19 ENCOUNTER — Encounter (INDEPENDENT_AMBULATORY_CARE_PROVIDER_SITE_OTHER): Payer: Medicare Other | Admitting: Ophthalmology

## 2013-01-08 ENCOUNTER — Encounter (INDEPENDENT_AMBULATORY_CARE_PROVIDER_SITE_OTHER): Payer: Medicare Other | Admitting: Ophthalmology

## 2013-01-08 DIAGNOSIS — H33009 Unspecified retinal detachment with retinal break, unspecified eye: Secondary | ICD-10-CM

## 2013-02-06 DIAGNOSIS — C61 Malignant neoplasm of prostate: Secondary | ICD-10-CM | POA: Diagnosis not present

## 2013-02-06 DIAGNOSIS — N3945 Continuous leakage: Secondary | ICD-10-CM | POA: Diagnosis not present

## 2013-02-06 DIAGNOSIS — N35919 Unspecified urethral stricture, male, unspecified site: Secondary | ICD-10-CM | POA: Diagnosis not present

## 2013-05-13 ENCOUNTER — Ambulatory Visit (INDEPENDENT_AMBULATORY_CARE_PROVIDER_SITE_OTHER): Payer: Medicare Other | Admitting: Ophthalmology

## 2013-05-13 DIAGNOSIS — H2700 Aphakia, unspecified eye: Secondary | ICD-10-CM | POA: Diagnosis not present

## 2013-05-13 DIAGNOSIS — H43819 Vitreous degeneration, unspecified eye: Secondary | ICD-10-CM | POA: Diagnosis not present

## 2013-05-13 DIAGNOSIS — H33009 Unspecified retinal detachment with retinal break, unspecified eye: Secondary | ICD-10-CM | POA: Diagnosis not present

## 2013-06-26 DIAGNOSIS — N3945 Continuous leakage: Secondary | ICD-10-CM | POA: Diagnosis not present

## 2013-06-26 DIAGNOSIS — R351 Nocturia: Secondary | ICD-10-CM | POA: Diagnosis not present

## 2013-06-26 DIAGNOSIS — C61 Malignant neoplasm of prostate: Secondary | ICD-10-CM | POA: Diagnosis not present

## 2013-06-26 DIAGNOSIS — N320 Bladder-neck obstruction: Secondary | ICD-10-CM | POA: Diagnosis not present

## 2013-09-02 ENCOUNTER — Encounter: Payer: Self-pay | Admitting: Cardiology

## 2013-10-21 DIAGNOSIS — S52509A Unspecified fracture of the lower end of unspecified radius, initial encounter for closed fracture: Secondary | ICD-10-CM | POA: Diagnosis not present

## 2013-10-21 DIAGNOSIS — S52539A Colles' fracture of unspecified radius, initial encounter for closed fracture: Secondary | ICD-10-CM | POA: Diagnosis not present

## 2013-10-21 DIAGNOSIS — M1710 Unilateral primary osteoarthritis, unspecified knee: Secondary | ICD-10-CM | POA: Diagnosis not present

## 2013-10-21 DIAGNOSIS — Z87891 Personal history of nicotine dependence: Secondary | ICD-10-CM | POA: Diagnosis not present

## 2013-10-21 DIAGNOSIS — Y92009 Unspecified place in unspecified non-institutional (private) residence as the place of occurrence of the external cause: Secondary | ICD-10-CM | POA: Diagnosis not present

## 2013-10-21 DIAGNOSIS — R296 Repeated falls: Secondary | ICD-10-CM | POA: Diagnosis not present

## 2013-10-21 DIAGNOSIS — S52609A Unspecified fracture of lower end of unspecified ulna, initial encounter for closed fracture: Secondary | ICD-10-CM | POA: Diagnosis not present

## 2013-10-21 DIAGNOSIS — IMO0002 Reserved for concepts with insufficient information to code with codable children: Secondary | ICD-10-CM | POA: Diagnosis not present

## 2013-10-25 DIAGNOSIS — S52539A Colles' fracture of unspecified radius, initial encounter for closed fracture: Secondary | ICD-10-CM | POA: Diagnosis not present

## 2013-11-05 DIAGNOSIS — S52539A Colles' fracture of unspecified radius, initial encounter for closed fracture: Secondary | ICD-10-CM | POA: Diagnosis not present

## 2013-11-11 DIAGNOSIS — S52539A Colles' fracture of unspecified radius, initial encounter for closed fracture: Secondary | ICD-10-CM | POA: Diagnosis not present

## 2013-11-18 ENCOUNTER — Ambulatory Visit (INDEPENDENT_AMBULATORY_CARE_PROVIDER_SITE_OTHER): Payer: Medicare Other | Admitting: Ophthalmology

## 2013-12-02 DIAGNOSIS — Z466 Encounter for fitting and adjustment of urinary device: Secondary | ICD-10-CM | POA: Diagnosis not present

## 2013-12-02 DIAGNOSIS — S52539A Colles' fracture of unspecified radius, initial encounter for closed fracture: Secondary | ICD-10-CM | POA: Diagnosis not present

## 2013-12-05 DIAGNOSIS — M25639 Stiffness of unspecified wrist, not elsewhere classified: Secondary | ICD-10-CM | POA: Diagnosis not present

## 2013-12-05 DIAGNOSIS — M25539 Pain in unspecified wrist: Secondary | ICD-10-CM | POA: Diagnosis not present

## 2013-12-05 DIAGNOSIS — M6281 Muscle weakness (generalized): Secondary | ICD-10-CM | POA: Diagnosis not present

## 2013-12-05 DIAGNOSIS — IMO0001 Reserved for inherently not codable concepts without codable children: Secondary | ICD-10-CM | POA: Diagnosis not present

## 2013-12-11 DIAGNOSIS — M6281 Muscle weakness (generalized): Secondary | ICD-10-CM | POA: Diagnosis not present

## 2013-12-11 DIAGNOSIS — IMO0001 Reserved for inherently not codable concepts without codable children: Secondary | ICD-10-CM | POA: Diagnosis not present

## 2013-12-11 DIAGNOSIS — M25539 Pain in unspecified wrist: Secondary | ICD-10-CM | POA: Diagnosis not present

## 2013-12-11 DIAGNOSIS — M25639 Stiffness of unspecified wrist, not elsewhere classified: Secondary | ICD-10-CM | POA: Diagnosis not present

## 2013-12-12 DIAGNOSIS — M25539 Pain in unspecified wrist: Secondary | ICD-10-CM | POA: Diagnosis not present

## 2013-12-12 DIAGNOSIS — IMO0001 Reserved for inherently not codable concepts without codable children: Secondary | ICD-10-CM | POA: Diagnosis not present

## 2013-12-12 DIAGNOSIS — M25639 Stiffness of unspecified wrist, not elsewhere classified: Secondary | ICD-10-CM | POA: Diagnosis not present

## 2013-12-12 DIAGNOSIS — M6281 Muscle weakness (generalized): Secondary | ICD-10-CM | POA: Diagnosis not present

## 2013-12-16 DIAGNOSIS — M6281 Muscle weakness (generalized): Secondary | ICD-10-CM | POA: Diagnosis not present

## 2013-12-16 DIAGNOSIS — IMO0001 Reserved for inherently not codable concepts without codable children: Secondary | ICD-10-CM | POA: Diagnosis not present

## 2013-12-16 DIAGNOSIS — M25539 Pain in unspecified wrist: Secondary | ICD-10-CM | POA: Diagnosis not present

## 2013-12-16 DIAGNOSIS — M25639 Stiffness of unspecified wrist, not elsewhere classified: Secondary | ICD-10-CM | POA: Diagnosis not present

## 2013-12-17 DIAGNOSIS — M6281 Muscle weakness (generalized): Secondary | ICD-10-CM | POA: Diagnosis not present

## 2013-12-17 DIAGNOSIS — M25539 Pain in unspecified wrist: Secondary | ICD-10-CM | POA: Diagnosis not present

## 2013-12-17 DIAGNOSIS — IMO0001 Reserved for inherently not codable concepts without codable children: Secondary | ICD-10-CM | POA: Diagnosis not present

## 2013-12-17 DIAGNOSIS — M25639 Stiffness of unspecified wrist, not elsewhere classified: Secondary | ICD-10-CM | POA: Diagnosis not present

## 2013-12-23 DIAGNOSIS — IMO0001 Reserved for inherently not codable concepts without codable children: Secondary | ICD-10-CM | POA: Diagnosis not present

## 2013-12-23 DIAGNOSIS — M25639 Stiffness of unspecified wrist, not elsewhere classified: Secondary | ICD-10-CM | POA: Diagnosis not present

## 2013-12-23 DIAGNOSIS — M25539 Pain in unspecified wrist: Secondary | ICD-10-CM | POA: Diagnosis not present

## 2013-12-23 DIAGNOSIS — M6281 Muscle weakness (generalized): Secondary | ICD-10-CM | POA: Diagnosis not present

## 2013-12-24 DIAGNOSIS — M6281 Muscle weakness (generalized): Secondary | ICD-10-CM | POA: Diagnosis not present

## 2013-12-24 DIAGNOSIS — M25639 Stiffness of unspecified wrist, not elsewhere classified: Secondary | ICD-10-CM | POA: Diagnosis not present

## 2013-12-24 DIAGNOSIS — M25539 Pain in unspecified wrist: Secondary | ICD-10-CM | POA: Diagnosis not present

## 2013-12-24 DIAGNOSIS — IMO0001 Reserved for inherently not codable concepts without codable children: Secondary | ICD-10-CM | POA: Diagnosis not present

## 2013-12-26 ENCOUNTER — Encounter: Payer: Self-pay | Admitting: Cardiology

## 2013-12-26 DIAGNOSIS — R05 Cough: Secondary | ICD-10-CM | POA: Diagnosis not present

## 2013-12-26 DIAGNOSIS — R918 Other nonspecific abnormal finding of lung field: Secondary | ICD-10-CM | POA: Diagnosis not present

## 2013-12-26 DIAGNOSIS — J9819 Other pulmonary collapse: Secondary | ICD-10-CM | POA: Diagnosis not present

## 2013-12-26 DIAGNOSIS — R059 Cough, unspecified: Secondary | ICD-10-CM | POA: Diagnosis not present

## 2013-12-26 DIAGNOSIS — J90 Pleural effusion, not elsewhere classified: Secondary | ICD-10-CM | POA: Diagnosis not present

## 2013-12-26 DIAGNOSIS — I10 Essential (primary) hypertension: Secondary | ICD-10-CM | POA: Diagnosis not present

## 2013-12-30 ENCOUNTER — Encounter: Payer: Self-pay | Admitting: Cardiology

## 2013-12-30 DIAGNOSIS — J986 Disorders of diaphragm: Secondary | ICD-10-CM | POA: Diagnosis not present

## 2013-12-30 DIAGNOSIS — I712 Thoracic aortic aneurysm, without rupture, unspecified: Secondary | ICD-10-CM | POA: Diagnosis not present

## 2013-12-30 DIAGNOSIS — R222 Localized swelling, mass and lump, trunk: Secondary | ICD-10-CM | POA: Diagnosis not present

## 2014-01-06 DIAGNOSIS — R079 Chest pain, unspecified: Secondary | ICD-10-CM | POA: Diagnosis not present

## 2014-01-06 DIAGNOSIS — K8020 Calculus of gallbladder without cholecystitis without obstruction: Secondary | ICD-10-CM | POA: Diagnosis not present

## 2014-01-06 DIAGNOSIS — I708 Atherosclerosis of other arteries: Secondary | ICD-10-CM | POA: Diagnosis not present

## 2014-01-06 DIAGNOSIS — R059 Cough, unspecified: Secondary | ICD-10-CM | POA: Diagnosis not present

## 2014-01-06 DIAGNOSIS — R05 Cough: Secondary | ICD-10-CM | POA: Diagnosis not present

## 2014-01-06 DIAGNOSIS — I10 Essential (primary) hypertension: Secondary | ICD-10-CM | POA: Diagnosis not present

## 2014-01-09 DIAGNOSIS — R9431 Abnormal electrocardiogram [ECG] [EKG]: Secondary | ICD-10-CM | POA: Diagnosis not present

## 2014-01-09 DIAGNOSIS — I209 Angina pectoris, unspecified: Secondary | ICD-10-CM | POA: Diagnosis not present

## 2014-01-09 DIAGNOSIS — I714 Abdominal aortic aneurysm, without rupture, unspecified: Secondary | ICD-10-CM | POA: Diagnosis not present

## 2014-01-13 DIAGNOSIS — S52539A Colles' fracture of unspecified radius, initial encounter for closed fracture: Secondary | ICD-10-CM | POA: Diagnosis not present

## 2014-01-28 ENCOUNTER — Emergency Department (HOSPITAL_COMMUNITY)
Admission: EM | Admit: 2014-01-28 | Discharge: 2014-01-29 | Disposition: A | Discharge status: Home / Self Care | Payer: Medicare Other | Attending: Emergency Medicine | Admitting: Emergency Medicine

## 2014-01-28 ENCOUNTER — Emergency Department (HOSPITAL_COMMUNITY): Payer: Medicare Other

## 2014-01-28 ENCOUNTER — Encounter (HOSPITAL_COMMUNITY): Payer: Self-pay | Admitting: Emergency Medicine

## 2014-01-28 DIAGNOSIS — M7989 Other specified soft tissue disorders: Secondary | ICD-10-CM | POA: Diagnosis not present

## 2014-01-28 DIAGNOSIS — Z7982 Long term (current) use of aspirin: Secondary | ICD-10-CM | POA: Insufficient documentation

## 2014-01-28 DIAGNOSIS — R609 Edema, unspecified: Secondary | ICD-10-CM | POA: Insufficient documentation

## 2014-01-28 DIAGNOSIS — R0600 Dyspnea, unspecified: Secondary | ICD-10-CM

## 2014-01-28 DIAGNOSIS — Z8669 Personal history of other diseases of the nervous system and sense organs: Secondary | ICD-10-CM | POA: Diagnosis not present

## 2014-01-28 DIAGNOSIS — R0602 Shortness of breath: Secondary | ICD-10-CM | POA: Diagnosis not present

## 2014-01-28 DIAGNOSIS — Z87448 Personal history of other diseases of urinary system: Secondary | ICD-10-CM | POA: Insufficient documentation

## 2014-01-28 DIAGNOSIS — Z87891 Personal history of nicotine dependence: Secondary | ICD-10-CM | POA: Insufficient documentation

## 2014-01-28 DIAGNOSIS — J9819 Other pulmonary collapse: Secondary | ICD-10-CM | POA: Diagnosis not present

## 2014-01-28 DIAGNOSIS — Z79899 Other long term (current) drug therapy: Secondary | ICD-10-CM | POA: Insufficient documentation

## 2014-01-28 DIAGNOSIS — Z8546 Personal history of malignant neoplasm of prostate: Secondary | ICD-10-CM | POA: Insufficient documentation

## 2014-01-28 DIAGNOSIS — J441 Chronic obstructive pulmonary disease with (acute) exacerbation: Secondary | ICD-10-CM | POA: Diagnosis not present

## 2014-01-28 LAB — CBC WITH DIFFERENTIAL/PLATELET
BASOS ABS: 0 10*3/uL (ref 0.0–0.1)
BASOS PCT: 0 % (ref 0–1)
Eosinophils Absolute: 0.1 10*3/uL (ref 0.0–0.7)
Eosinophils Relative: 1 % (ref 0–5)
HEMATOCRIT: 39.7 % (ref 39.0–52.0)
Hemoglobin: 13.4 g/dL (ref 13.0–17.0)
LYMPHS PCT: 17 % (ref 12–46)
Lymphs Abs: 1.5 10*3/uL (ref 0.7–4.0)
MCH: 30.4 pg (ref 26.0–34.0)
MCHC: 33.8 g/dL (ref 30.0–36.0)
MCV: 90 fL (ref 78.0–100.0)
MONO ABS: 1 10*3/uL (ref 0.1–1.0)
Monocytes Relative: 11 % (ref 3–12)
NEUTROS ABS: 6.1 10*3/uL (ref 1.7–7.7)
NEUTROS PCT: 71 % (ref 43–77)
Platelets: 176 10*3/uL (ref 150–400)
RBC: 4.41 MIL/uL (ref 4.22–5.81)
RDW: 13.4 % (ref 11.5–15.5)
WBC: 8.7 10*3/uL (ref 4.0–10.5)

## 2014-01-28 LAB — COMPREHENSIVE METABOLIC PANEL
ALBUMIN: 3.4 g/dL — AB (ref 3.5–5.2)
ALK PHOS: 101 U/L (ref 39–117)
ALT: 9 U/L (ref 0–53)
AST: 17 U/L (ref 0–37)
BILIRUBIN TOTAL: 0.3 mg/dL (ref 0.3–1.2)
BUN: 17 mg/dL (ref 6–23)
CHLORIDE: 103 meq/L (ref 96–112)
CO2: 27 meq/L (ref 19–32)
CREATININE: 1.45 mg/dL — AB (ref 0.50–1.35)
Calcium: 9.1 mg/dL (ref 8.4–10.5)
GFR calc Af Amer: 53 mL/min — ABNORMAL LOW (ref >90)
GFR, EST NON AFRICAN AMERICAN: 46 mL/min — AB (ref >90)
Glucose, Bld: 131 mg/dL — ABNORMAL HIGH (ref 70–99)
POTASSIUM: 4.7 meq/L (ref 3.7–5.3)
SODIUM: 141 meq/L (ref 137–147)
Total Protein: 7.2 g/dL (ref 6.0–8.3)

## 2014-01-28 LAB — I-STAT TROPONIN, ED
TROPONIN I, POC: 0 ng/mL (ref 0.00–0.08)
Troponin i, poc: 0 ng/mL (ref 0.00–0.08)

## 2014-01-28 NOTE — ED Notes (Signed)
Pt reports SOB starting a week ago and worsening; cough; denies fevers. Sating 93 on RA. Pt denies smoking. Pt denies chest pain today but had some 3 weeks. Chest pain lasted 5 mins and then wore off described as constant in right side. Pt denies cardiac issues except HTN. No diabetes.

## 2014-01-28 NOTE — ED Notes (Signed)
Dr Zavitz at bedside  

## 2014-01-28 NOTE — ED Notes (Signed)
EKG hand delivered to Dr. Reather Converse.

## 2014-01-29 LAB — PRO B NATRIURETIC PEPTIDE: Pro B Natriuretic peptide (BNP): 264.9 pg/mL (ref 0–450)

## 2014-01-29 LAB — D-DIMER, QUANTITATIVE: D-Dimer, Quant: 0.43 ug/mL-FEU (ref 0.00–0.48)

## 2014-01-29 MED ORDER — PREDNISONE 20 MG PO TABS: 40.0000 mg | ORAL_TABLET | Freq: Every day | ORAL | Status: DC

## 2014-01-29 MED ORDER — ALBUTEROL SULFATE HFA 108 (90 BASE) MCG/ACT IN AERS: 1.0000 | INHALATION_SPRAY | Freq: Four times a day (QID) | RESPIRATORY_TRACT | Status: DC | PRN

## 2014-01-29 NOTE — Discharge Instructions (Signed)
It is very important for you to continue to followup with your doctor for further workup for your dyspnea and lower oxygen saturation of 92% Return to the ER for worsening symptoms, passing out, fevers, chest pain or pressure.  If you were given medicines take as directed.  If you are on coumadin or contraceptives realize their levels and effectiveness is altered by many different medicines.  If you have any reaction (rash, tongues swelling, other) to the medicines stop taking and see a physician.   Please follow up as directed and return to the ER or see a physician for new or worsening symptoms.  Thank you. Filed Vitals:   01/28/14 2214 01/28/14 2303 01/28/14 2304 01/28/14 2341  BP: 145/82   157/88  Pulse: 74   76  Temp: 97.8 F (36.6 C)     TempSrc: Oral     Resp: 18   18  Height:      Weight:      SpO2: 94% 91% 98% 96%

## 2014-01-29 NOTE — ED Provider Notes (Signed)
CSN: [id number]     Arrival date & time 01/28/14  1944 History   First MD Initiated Contact with Patient 01/28/14 2259     Chief Complaint  Patient presents with  . Shortness of Breath     (Consider location/radiation/quality/duration/timing/severity/associated sxs/prior Treatment) HPI Comments: Is a 73 year old male with with high blood pressure history presents with intermittent shortness of breath and cough for the past 3 weeks. Patient seen a primary care Dr. and had an echo of his heart and CT chest that were normal per him and his wife.  He was told he might have sleep apnea. He he only notices shortness of breath when he leans forward to tie shoes.  Nonproductive cough. No fevers or chills. No known heart failure or blood clot. Patient denies recent surgery unilateral leg pain or swelling, active cancer, or lung disease. He smoked cigarettes years ago. No chest pain or pressure.  Patient is a 73 y.o. male presenting with shortness of breath. The history is provided by the patient.  Shortness of Breath Associated symptoms: cough   Associated symptoms: no abdominal pain, no chest pain, no fever, no headaches, no neck pain, no rash and no vomiting     Past Medical History  Diagnosis Date  . History of urinary retention   . History of urethral stricture   . Bladder neck contracture   . Peyronie disease   . History of prostate cancer S/P SEED IMPLANTS   . COPD (chronic obstructive pulmonary disease) PER CXRAY  10-28-2010  . Urinary incontinence   . Nocturia   . Frequency of urination   . OSA (obstructive sleep apnea) NON-COMPLIANT    does not use cpap  . Complication of anesthesia   . PONV (postoperative nausea and vomiting)    Past Surgical History  Procedure Laterality Date  . Cysto/ urethral dilation/  urethra balloon dilation / vaporization of prostate and urethral stricture  10-28-2010  . Cysto/ urethral dilation  04-21-2008  . Radioactive seed implants, prostate   06-20-2002  . Cataract extraction w/ intraocular lens  implant, bilateral    . Prostaglandin injection  2003  . Cystoscopy  01/27/2012    Procedure: CYSTOSCOPY;  Surgeon: Ailene Rud, MD;  Location: [facility];  Service: Urology;  Laterality: N/A;  cysto, cook balloon dilation of  bladder neck contracture gyrus button vaporization  carm   . Transurethral resection of bladder tumor  05/15/2012    Procedure: TRANSURETHRAL RESECTION OF BLADDER TUMOR (TURBT);  Surgeon: Ailene Rud, MD;  Location: [facility];  Service: Urology;  Laterality: N/A;  . Eye surgery      cataract removed bilaterally  . Scleral buckle  10/23/2012    Procedure: SCLERAL BUCKLE;  Surgeon: Hayden Pedro, MD;  Location: [place];  Service: Ophthalmology;  Laterality: Right;  . Gas insertion  10/23/2012    Procedure: INSERTION OF GAS;  Surgeon: Hayden Pedro, MD;  Location: [place];  Service: Ophthalmology;  Laterality: Right;  C3F8  . Photocoagulation with laser  10/23/2012    Procedure: PHOTOCOAGULATION WITH LASER;  Surgeon: Hayden Pedro, MD;  Location: [place];  Service: Ophthalmology;  Laterality: Right;  . 25 gauge pars plana vitrectomy with 20 gauge mvr port Right 12/11/2012    Procedure: 25 GAUGE PARS PLANA VITRECTOMY WITH 20 GAUGE MVR PORT;  Surgeon: Hayden Pedro, MD;  Location: [place];  Service: Ophthalmology;  Laterality: Right;  Injection of silicone Oil  . Membrane peel Right 12/11/2012  Procedure: MEMBRANE PEEL;  Surgeon: Hayden Pedro, MD;  Location: [place];  Service: Ophthalmology;  Laterality: Right;  . Laser photo ablation Right 12/11/2012    Procedure: LASER PHOTO ABLATION;  Surgeon: Hayden Pedro, MD;  Location: [place];  Service: Ophthalmology;  Laterality: Right;  . Iridectomy Right 12/11/2012    Procedure: IRIDECTOMY;  Surgeon: Hayden Pedro, MD;  Location: [place];  Service: Ophthalmology;  Laterality: Right;  . Perfluorone injection Right 12/11/2012    Procedure: PERFLUORONE  INJECTION;  Surgeon: Hayden Pedro, MD;  Location: [place];  Service: Ophthalmology;  Laterality: Right;  Injection and Removal  . Gas/fluid exchange Right 12/11/2012    Procedure: GAS/FLUID EXCHANGE;  Surgeon: Hayden Pedro, MD;  Location: [place];  Service: Ophthalmology;  Laterality: Right;   History reviewed. No pertinent family history. History  Substance Use Topics  . Smoking status: Former Smoker -- 20 years    Types: Cigarettes    Quit date: 01/24/1981  . Smokeless tobacco: Never Used  . Alcohol Use: No    Review of Systems  Constitutional: Negative for fever and chills.  HENT: Negative for congestion.   Eyes: Negative for visual disturbance.  Respiratory: Positive for cough and shortness of breath.   Cardiovascular: Positive for leg swelling. Negative for chest pain.  Gastrointestinal: Negative for vomiting and abdominal pain.  Genitourinary: Negative for dysuria and flank pain. Testicular pain: mild chronic.  Musculoskeletal: Negative for back pain, neck pain and neck stiffness.  Skin: Negative for rash.  Neurological: Negative for light-headedness and headaches.      Allergies  Review of patient's allergies indicates no known allergies.  Home Medications   Prior to Admission medications   Medication Sig Start Date End Date Taking? Authorizing Provider  aspirin EC 81 MG tablet Take 81 mg by mouth daily.   Yes Historical Provider, MD  ibuprofen (ADVIL,MOTRIN) 200 MG tablet Take 200 mg by mouth every 6 (six) hours as needed. For pain   Yes Historical Provider, MD  LISINOPRIL PO Take 1 tablet by mouth daily.   Yes Historical Provider, MD   BP 157/88  Pulse 76  Temp(Src) 97.8 F (36.6 C) (Oral)  Resp 18  Ht 5\' 7"  (1.702 m)  Wt 220 lb (99.791 kg)  BMI 34.45 kg/m2  SpO2 96% Physical Exam  Nursing note and vitals reviewed. Constitutional: He is oriented to person, place, and time. He appears well-developed and well-nourished.  HENT:  Head: Normocephalic and  atraumatic.  Eyes: Conjunctivae are normal. Right eye exhibits no discharge. Left eye exhibits no discharge.  Neck: Normal range of motion. Neck supple. No tracheal deviation present.  Cardiovascular: Normal rate and regular rhythm.   Pulmonary/Chest: Effort normal. He has wheezes (Mild end expiratory wheezes bilateral).  Abdominal: Soft. He exhibits no distension. There is no tenderness. There is no guarding.  Musculoskeletal: He exhibits edema (mild bilateral lower extremities).  Neurological: He is alert and oriented to person, place, and time.  Skin: Skin is warm. No rash noted.  Psychiatric: He has a normal mood and affect.    ED Course  Procedures (including critical care time) Labs Review Labs Reviewed  COMPREHENSIVE METABOLIC PANEL - Abnormal; Notable for the following:    Glucose, Bld 131 (*)    Creatinine, Ser 1.45 (*)    Albumin 3.4 (*)    GFR calc non Af Amer 46 (*)    GFR calc Af Amer 53 (*)    All other components within normal limits  CBC WITH DIFFERENTIAL  PRO B NATRIURETIC PEPTIDE  D-DIMER, QUANTITATIVE  I-STAT TROPOININ, ED  Randolm Idol, ED    Imaging Review Dg Chest 2 View  01/28/2014   CLINICAL DATA:  Shortness of Breath  EXAM: CHEST  2 VIEW  COMPARISON:  Chest radiograph December 26, 2013 and chest CT December 30, 2013  FINDINGS: There is persistent elevation right hemidiaphragm. There is persistent atelectatic change in the right base. Elsewhere, lungs are clear. The heart size and pulmonary vascularity are normal. No adenopathy. No bone lesions.  IMPRESSION: Persistent right base atelectasis with elevation of the right hemidiaphragm. Lungs elsewhere are clear.   Electronically Signed   By: Lowella Grip M.D.   On: 01/28/2014 21:03     EKG Interpretation   Date/Time:  Tuesday January 28 2014 19:50:34 EDT Ventricular Rate:  87 PR Interval:  142 QRS Duration: 84 QT Interval:  346 QTC Calculation: 416 R Axis:   -12 Text Interpretation:  Sinus rhythm  with Premature atrial complexes  Nonspecific ST abnormality Abnormal ECG poor baseline Confirmed by Amirr Achord   MD, Malavika Lira ([id number]) on 01/28/2014 11:02:45 PM      MDM   Final diagnoses:  Dyspnea   Broad differential with dyspnea an elderly gentleman. Patient low risk for pulmonary embolism, d-dimer negative. Patient has no chest pain and shortness of breath is atypical, delta troponin negative and EKG no acute findings. Attempt to obtain [facility] unsuccessful Patient 92% without oxygen. Plan to give steroids and albuterol with close followup with primary care Dr. for other reasons for dyspnea including sleep apnea, pulmonary hypertension, CAD, other. Patient and wife comfortable with plan.  Results and differential diagnosis were discussed with the patient. Close follow up outpatient was discussed, patient comfortable with the plan.   Filed Vitals:   01/29/14 0000 01/29/14 0015 01/29/14 0030 01/29/14 0046  BP: 162/82 155/84 156/93 156/93  Pulse: 77 76 81 83  Temp:      TempSrc:      Resp: 23 19 16 27   Height:      Weight:      SpO2: 94% 94% 93% 93%   And he  Mariea Clonts, MD 01/29/14 [phone]

## 2014-02-05 DIAGNOSIS — R059 Cough, unspecified: Secondary | ICD-10-CM | POA: Diagnosis not present

## 2014-02-05 DIAGNOSIS — R05 Cough: Secondary | ICD-10-CM | POA: Diagnosis not present

## 2014-02-05 DIAGNOSIS — K8020 Calculus of gallbladder without cholecystitis without obstruction: Secondary | ICD-10-CM | POA: Diagnosis not present

## 2014-02-05 DIAGNOSIS — I708 Atherosclerosis of other arteries: Secondary | ICD-10-CM | POA: Diagnosis not present

## 2014-02-05 DIAGNOSIS — E7800 Pure hypercholesterolemia, unspecified: Secondary | ICD-10-CM | POA: Diagnosis not present

## 2014-02-05 DIAGNOSIS — I10 Essential (primary) hypertension: Secondary | ICD-10-CM | POA: Diagnosis not present

## 2014-02-05 DIAGNOSIS — R079 Chest pain, unspecified: Secondary | ICD-10-CM | POA: Diagnosis not present

## 2014-06-04 DIAGNOSIS — J449 Chronic obstructive pulmonary disease, unspecified: Secondary | ICD-10-CM | POA: Diagnosis not present

## 2014-06-04 DIAGNOSIS — I708 Atherosclerosis of other arteries: Secondary | ICD-10-CM | POA: Diagnosis not present

## 2014-06-04 DIAGNOSIS — R079 Chest pain, unspecified: Secondary | ICD-10-CM | POA: Diagnosis not present

## 2014-06-04 DIAGNOSIS — K8020 Calculus of gallbladder without cholecystitis without obstruction: Secondary | ICD-10-CM | POA: Diagnosis not present

## 2014-06-04 DIAGNOSIS — I10 Essential (primary) hypertension: Secondary | ICD-10-CM | POA: Diagnosis not present

## 2014-06-04 DIAGNOSIS — E7800 Pure hypercholesterolemia, unspecified: Secondary | ICD-10-CM | POA: Diagnosis not present

## 2014-06-04 DIAGNOSIS — I712 Thoracic aortic aneurysm, without rupture, unspecified: Secondary | ICD-10-CM | POA: Diagnosis not present

## 2014-06-19 ENCOUNTER — Ambulatory Visit (INDEPENDENT_AMBULATORY_CARE_PROVIDER_SITE_OTHER): Payer: Medicare Other | Admitting: Cardiology

## 2014-06-19 ENCOUNTER — Encounter: Payer: Self-pay | Admitting: Cardiology

## 2014-06-19 VITALS — BP 156/91 | HR 74 | Ht 66.0 in | Wt 220.0 lb

## 2014-06-19 DIAGNOSIS — I351 Nonrheumatic aortic (valve) insufficiency: Secondary | ICD-10-CM

## 2014-06-19 DIAGNOSIS — I359 Nonrheumatic aortic valve disorder, unspecified: Secondary | ICD-10-CM

## 2014-06-19 DIAGNOSIS — I712 Thoracic aortic aneurysm, without rupture, unspecified: Secondary | ICD-10-CM

## 2014-06-19 DIAGNOSIS — R0602 Shortness of breath: Secondary | ICD-10-CM | POA: Diagnosis not present

## 2014-06-19 DIAGNOSIS — I719 Aortic aneurysm of unspecified site, without rupture: Secondary | ICD-10-CM | POA: Insufficient documentation

## 2014-06-19 DIAGNOSIS — R030 Elevated blood-pressure reading, without diagnosis of hypertension: Secondary | ICD-10-CM

## 2014-06-19 DIAGNOSIS — IMO0001 Reserved for inherently not codable concepts without codable children: Secondary | ICD-10-CM

## 2014-06-19 NOTE — Patient Instructions (Signed)
Continue all current medications. Your physician has requested that you regularly monitor and record your blood pressure readings at home. Please take readings approximately 2 hours after medication x 2 weeks & return to office for MD review.  Your physician wants you to follow up in:  4 months.  You will receive a reminder letter in the mail one-two months in advance.  If you don't receive a letter, please call our office to schedule the follow up appointment

## 2014-06-19 NOTE — Progress Notes (Signed)
Clinical Summary [name] is a 73 y.o.male seen today as a new patient for the following medical problems.  1. Thoracic aneurysm - patient had CT chest 12/2013 at [facility] for SOB, noted ascending aorta 4.4 cm aneusym - echo 01/2014 aortic root mildly dilated at 4.2 cm - denies any chest pain  2. Aortic regurgitation - echo 01/2014 LVEF 60-65%, LVIDs 2.9 cm, mild AI - SOB x 3 months. + cough, non-productive. No LE edema. DOE has increased over the last few months. - CXR and CT scan without evidence of pulmonary edema  3. CAD - coronary calcifications noted on CT [facility] - denies any chest pain  4. Tobacco - quit 30 years ago, smoked approx 30 years.  - reports recent PFTs at primary doctors   Past Medical History  Diagnosis Date  . History of urinary retention   . History of urethral stricture   . Bladder neck contracture   . Peyronie disease   . History of prostate cancer S/P SEED IMPLANTS   . COPD (chronic obstructive pulmonary disease) PER CXRAY  10-28-2010  . Urinary incontinence   . Nocturia   . Frequency of urination   . OSA (obstructive sleep apnea) NON-COMPLIANT    does not use cpap  . Complication of anesthesia   . PONV (postoperative nausea and vomiting)      No Known Allergies   Current Outpatient Prescriptions  Medication Sig Dispense Refill  . albuterol (PROVENTIL HFA;VENTOLIN HFA) 108 (90 BASE) MCG/ACT inhaler Inhale 1-2 puffs into the lungs every 6 (six) hours as needed for wheezing or shortness of breath.  1 Inhaler  0  . aspirin EC 81 MG tablet Take 81 mg by mouth daily.      Marland Kitchen ibuprofen (ADVIL,MOTRIN) 200 MG tablet Take 200 mg by mouth every 6 (six) hours as needed. For pain      . LISINOPRIL PO Take 1 tablet by mouth daily.      . predniSONE (DELTASONE) 20 MG tablet Take 2 tablets (40 mg total) by mouth daily with breakfast.  8 tablet  0   No current facility-administered medications for this visit.     Past Surgical History  Procedure  Laterality Date  . Cysto/ urethral dilation/  urethra balloon dilation / vaporization of prostate and urethral stricture  10-28-2010  . Cysto/ urethral dilation  04-21-2008  . Radioactive seed implants, prostate  06-20-2002  . Cataract extraction w/ intraocular lens  implant, bilateral    . Prostaglandin injection  2003  . Cystoscopy  01/27/2012    Procedure: CYSTOSCOPY;  Surgeon: Ailene Rud, MD;  Location: [facility];  Service: Urology;  Laterality: N/A;  cysto, cook balloon dilation of  bladder neck contracture gyrus button vaporization  carm   . Transurethral resection of bladder tumor  05/15/2012    Procedure: TRANSURETHRAL RESECTION OF BLADDER TUMOR (TURBT);  Surgeon: Ailene Rud, MD;  Location: [facility];  Service: Urology;  Laterality: N/A;  . Eye surgery      cataract removed bilaterally  . Scleral buckle  10/23/2012    Procedure: SCLERAL BUCKLE;  Surgeon: Hayden Pedro, MD;  Location: [place];  Service: Ophthalmology;  Laterality: Right;  . Gas insertion  10/23/2012    Procedure: INSERTION OF GAS;  Surgeon: Hayden Pedro, MD;  Location: [place];  Service: Ophthalmology;  Laterality: Right;  C3F8  . Photocoagulation with laser  10/23/2012    Procedure: PHOTOCOAGULATION WITH LASER;  Surgeon: Jenny Reichmann  Eber Jones, MD;  Location: [place];  Service: Ophthalmology;  Laterality: Right;  . 25 gauge pars plana vitrectomy with 20 gauge mvr port Right 12/11/2012    Procedure: 25 GAUGE PARS PLANA VITRECTOMY WITH 20 GAUGE MVR PORT;  Surgeon: Hayden Pedro, MD;  Location: [place];  Service: Ophthalmology;  Laterality: Right;  Injection of silicone Oil  . Membrane peel Right 12/11/2012    Procedure: MEMBRANE PEEL;  Surgeon: Hayden Pedro, MD;  Location: [place];  Service: Ophthalmology;  Laterality: Right;  . Laser photo ablation Right 12/11/2012    Procedure: LASER PHOTO ABLATION;  Surgeon: Hayden Pedro, MD;  Location: [place];  Service: Ophthalmology;  Laterality: Right;  .  Iridectomy Right 12/11/2012    Procedure: IRIDECTOMY;  Surgeon: Hayden Pedro, MD;  Location: [place];  Service: Ophthalmology;  Laterality: Right;  . Perfluorone injection Right 12/11/2012    Procedure: PERFLUORONE INJECTION;  Surgeon: Hayden Pedro, MD;  Location: [place];  Service: Ophthalmology;  Laterality: Right;  Injection and Removal  . Gas/fluid exchange Right 12/11/2012    Procedure: GAS/FLUID EXCHANGE;  Surgeon: Hayden Pedro, MD;  Location: [place];  Service: Ophthalmology;  Laterality: Right;     No Known Allergies    No family history on file.   Social History [name] reports that he quit smoking about 33 years ago. His smoking use included Cigarettes. He smoked 0.00 packs per day for 20 years. He has never used smokeless tobacco. [name] reports that he does not drink alcohol.   Review of Systems CONSTITUTIONAL: No weight loss, fever, chills, weakness or fatigue.  HEENT: Eyes: No visual loss, blurred vision, double vision or yellow sclerae.No hearing loss, sneezing, congestion, runny nose or sore throat.  SKIN: No rash or itching.  CARDIOVASCULAR: per HPI RESPIRATORY: + SOB, cough GASTROINTESTINAL: No anorexia, nausea, vomiting or diarrhea. No abdominal pain or blood.  GENITOURINARY: No burning on urination, no polyuria NEUROLOGICAL: No headache, dizziness, syncope, paralysis, ataxia, numbness or tingling in the extremities. No change in bowel or bladder control.  MUSCULOSKELETAL: No muscle, back pain, joint pain or stiffness.  LYMPHATICS: No enlarged nodes. No history of splenectomy.  PSYCHIATRIC: No history of depression or anxiety.  ENDOCRINOLOGIC: No reports of sweating, cold or heat intolerance. No polyuria or polydipsia.  Marland Kitchen   Physical Examination p 74 bp 156/91 Wt 220 lbs BMI 36 Gen: resting comfortably, no acute distress HEENT: no scleral icterus, pupils equal round and reactive, no palptable cervical adenopathy,  CV: RRR, no m/r/g, no JVD, no carotid  bruits Resp: Clear to auscultation bilaterally GI: abdomen is soft, non-tender, non-distended, normal bowel sounds, no hepatosplenomegaly MSK: extremities are warm, no edema.  Skin: warm, no rash Neuro:  no focal deficits Psych: appropriate affect      Assessment and Plan  1. Thoracic aneurysm - aortic root 4.2 cm by echo, ascending thoracic aneurysm on CT at 4.4 cm. - continue to follow with serial CT scans, plan for repeat 12/2014  2. Aortic regurgitation - mild by echo, continue to follow  3. SOB - + SOB with cough, long tobacco history. He reports recent breathing tests at pcp, started on inhaler - symptoms most consistent with pulmonary cause, echo without significant cardiac etiology - does have coronary calcifications on CT scan, no chest pain, normal LVEF. Based on symptoms most likely pulmonary, if progress or develops chest pain consider functional assessment of coronaries  4. Elevated blood pressure - bp elevated in clinic,  he has not been on medications - bp from recent pcp visit within normal limits - will keep bp log x 2 weeks and drop off, important bp control in setting of aortic aneurysm   F/u 4 months       Arnoldo Lenis, M.D., F.A.C.C.

## 2014-10-17 ENCOUNTER — Encounter: Payer: Self-pay | Admitting: Cardiology

## 2014-10-17 ENCOUNTER — Telehealth: Payer: Self-pay | Admitting: Cardiology

## 2014-10-17 ENCOUNTER — Ambulatory Visit (INDEPENDENT_AMBULATORY_CARE_PROVIDER_SITE_OTHER): Payer: Medicare Other | Admitting: Cardiology

## 2014-10-17 VITALS — BP 155/75 | HR 68 | Ht 67.0 in | Wt 218.0 lb

## 2014-10-17 DIAGNOSIS — I712 Thoracic aortic aneurysm, without rupture, unspecified: Secondary | ICD-10-CM

## 2014-10-17 DIAGNOSIS — R600 Localized edema: Secondary | ICD-10-CM | POA: Diagnosis not present

## 2014-10-17 DIAGNOSIS — I10 Essential (primary) hypertension: Secondary | ICD-10-CM | POA: Diagnosis not present

## 2014-10-17 MED ORDER — FUROSEMIDE 20 MG PO TABS: ORAL_TABLET | ORAL | Status: DC

## 2014-10-17 MED ORDER — AMLODIPINE BESYLATE 2.5 MG PO TABS: 2.5000 mg | ORAL_TABLET | Freq: Every day | ORAL | Status: DC

## 2014-10-17 NOTE — Progress Notes (Signed)
Clinical Summary Steve [name] is a 74 y.o.male seen today for follow up of the following medical probelsm.  1. Thoracic aortic aneurysm - patient had CT chest 12/2013 at [facility] for SOB, noted ascending aorta 4.4 cm aneusym - echo 01/2014 aortic root mildly dilated at 4.2 cm - denies any chest pain since last visit  2. CAD - coronary calcifications noted on CT san - denies any chest pain  3. Elevated blood pressure - noted last visit. He kept bp log last visit which showed SBPs 140s-160s.     Past Medical History  Diagnosis Date  . History of urinary retention   . History of urethral stricture   . Bladder neck contracture   . Peyronie disease   . History of prostate cancer S/P SEED IMPLANTS   . COPD (chronic obstructive pulmonary disease) PER CXRAY  10-28-2010  . Urinary incontinence   . Nocturia   . Frequency of urination   . OSA (obstructive sleep apnea) NON-COMPLIANT    does not use cpap  . Complication of anesthesia   . PONV (postoperative nausea and vomiting)      No Known Allergies   Current Outpatient Prescriptions  Medication Sig Dispense Refill  . aspirin EC 81 MG tablet Take 81 mg by mouth daily.    Marland Kitchen ibuprofen (ADVIL,MOTRIN) 200 MG tablet Take 200 mg by mouth every 6 (six) hours as needed. For pain     No current facility-administered medications for this visit.     Past Surgical History  Procedure Laterality Date  . Cysto/ urethral dilation/  urethra balloon dilation / vaporization of prostate and urethral stricture  10-28-2010  . Cysto/ urethral dilation  04-21-2008  . Radioactive seed implants, prostate  06-20-2002  . Cataract extraction w/ intraocular lens  implant, bilateral    . Prostaglandin injection  2003  . Cystoscopy  01/27/2012    Procedure: CYSTOSCOPY;  Surgeon: Ailene Rud, MD;  Location: [facility];  Service: Urology;  Laterality: N/A;  cysto, cook balloon dilation of  bladder neck contracture gyrus button  vaporization  carm   . Transurethral resection of bladder tumor  05/15/2012    Procedure: TRANSURETHRAL RESECTION OF BLADDER TUMOR (TURBT);  Surgeon: Ailene Rud, MD;  Location: [facility];  Service: Urology;  Laterality: N/A;  . Eye surgery      cataract removed bilaterally  . Scleral buckle  10/23/2012    Procedure: SCLERAL BUCKLE;  Surgeon: Hayden Pedro, MD;  Location: [place];  Service: Ophthalmology;  Laterality: Right;  . Gas insertion  10/23/2012    Procedure: INSERTION OF GAS;  Surgeon: Hayden Pedro, MD;  Location: [place];  Service: Ophthalmology;  Laterality: Right;  C3F8  . Photocoagulation with laser  10/23/2012    Procedure: PHOTOCOAGULATION WITH LASER;  Surgeon: Hayden Pedro, MD;  Location: [place];  Service: Ophthalmology;  Laterality: Right;  . 25 gauge pars plana vitrectomy with 20 gauge mvr port Right 12/11/2012    Procedure: 25 GAUGE PARS PLANA VITRECTOMY WITH 20 GAUGE MVR PORT;  Surgeon: Hayden Pedro, MD;  Location: [place];  Service: Ophthalmology;  Laterality: Right;  Injection of silicone Oil  . Membrane peel Right 12/11/2012    Procedure: MEMBRANE PEEL;  Surgeon: Hayden Pedro, MD;  Location: [place];  Service: Ophthalmology;  Laterality: Right;  . Laser photo ablation Right 12/11/2012    Procedure: LASER PHOTO ABLATION;  Surgeon: Hayden Pedro, MD;  Location: [place];  Service: Ophthalmology;  Laterality: Right;  . Iridectomy Right 12/11/2012    Procedure: IRIDECTOMY;  Surgeon: Hayden Pedro, MD;  Location: [place];  Service: Ophthalmology;  Laterality: Right;  . Perfluorone injection Right 12/11/2012    Procedure: PERFLUORONE INJECTION;  Surgeon: Hayden Pedro, MD;  Location: [place];  Service: Ophthalmology;  Laterality: Right;  Injection and Removal  . Gas/fluid exchange Right 12/11/2012    Procedure: GAS/FLUID EXCHANGE;  Surgeon: Hayden Pedro, MD;  Location: [place];  Service: Ophthalmology;  Laterality: Right;     No Known Allergies    No family history  on file.   Social History Mr. Granquist reports that he quit smoking about 33 years ago. His smoking use included Cigarettes. He started smoking about 59 years ago. He has a 40 pack-year smoking history. He has never used smokeless tobacco. Mr. Sciarra reports that he does not drink alcohol.   Review of Systems CONSTITUTIONAL: No weight loss, fever, chills, weakness or fatigue.  HEENT: Eyes: No visual loss, blurred vision, double vision or yellow sclerae.No hearing loss, sneezing, congestion, runny nose or sore throat.  SKIN: No rash or itching.  CARDIOVASCULAR: pe rHPI RESPIRATORY: No shortness of breath, cough or sputum.  GASTROINTESTINAL: No anorexia, nausea, vomiting or diarrhea. No abdominal pain or blood.  GENITOURINARY: No burning on urination, no polyuria NEUROLOGICAL: No headache, dizziness, syncope, paralysis, ataxia, numbness or tingling in the extremities. No change in bowel or bladder control.  MUSCULOSKELETAL: No muscle, back pain, joint pain or stiffness.  LYMPHATICS: No enlarged nodes. No history of splenectomy.  PSYCHIATRIC: No history of depression or anxiety.  ENDOCRINOLOGIC: No reports of sweating, cold or heat intolerance. No polyuria or polydipsia.  Marland Kitchen   Physical Examination p 68 bp 155/75 Wt 218 lbs BMI 34 Gen: resting comfortably, no acute distress HEENT: no scleral icterus, pupils equal round and reactive, no palptable cervical adenopathy,  CV: RRR, no m/r/g, no JVD, no carotid bruits Resp: Clear to auscultation bilaterally GI: abdomen is soft, non-tender, non-distended, normal bowel sounds, no hepatosplenomegaly MSK: extremities are warm, no edema.  Skin: warm, no rash Neuro:  no focal deficits Psych: appropriate affect   Diagnostic Studies 01/2014 Echo LVEF 60-65%, possible diastolic dysfunction, mild AI, mildly dilated aorta.    Assessment and Plan  1. Thoracic aneurysm - aortic root 4.2 cm by echo, ascending thoracic aneurysm on CT at 4.4 cm. -  continue to follow with serial CT scans, plan for repeat 01/2015  2. HTN - noted last visit, home numbers confirm elevated bp's - will start norvasc 2.5mg  daily, he reports some years ago was on bp meds and was somewhat sensitive. Will start low dose and titrate gradually - keep bp log x 2 weeks and call w/ results  3. LE edema - occasional, will provide prn lasix   F/u 1 year      Arnoldo Lenis, M.D.

## 2014-10-17 NOTE — Telephone Encounter (Signed)
CT angio to be done in April Scheduled at [facility] for April 4th @ 9:30 arrive at 9:15 Will be going to have lab work prior

## 2014-10-17 NOTE — Patient Instructions (Signed)
Your physician wants you to follow-up in: 1 year with Dr. Bryna Colander will receive a reminder letter in the mail two months in advance. If you don't receive a letter, please call our office to schedule the follow-up appointment.  Your physician has recommended you make the following change in your medication:   START AMLODIPINE 2.5 MG DAILY  START LASIX 20 MG DAILY AS NEEDED FOR SWELLING  Non-Cardiac CT Angiography (CTA), is a special type of CT scan that uses a computer to produce multi-dimensional views of major blood vessels throughout the body. In CT angiography, a contrast material is injected through an IV to help visualize the blood vessels  Your physician has requested that you regularly monitor and record your blood pressure readings at home FOR 2 [place]. Please use the same machine at the same time of day to check your readings and record them to bring to your follow-up visit.  Thank you for choosing [place]!!

## 2014-11-03 ENCOUNTER — Telehealth: Payer: Self-pay | Admitting: *Deleted

## 2014-11-03 MED ORDER — AMLODIPINE BESYLATE 5 MG PO TABS: 5.0000 mg | ORAL_TABLET | Freq: Every day | ORAL | Status: DC

## 2014-11-03 NOTE — Telephone Encounter (Signed)
Pt wife called in BP reading since Jan 16th will forward to Dr. Harl Bowie. 149/79 169/92 162/86 146/88 156/87 149/84 166/90 142/81 147/83 144/82 146/88 163/99 166/84 131/80

## 2014-11-03 NOTE — Addendum Note (Signed)
Addended by: Julian Hy T on: 11/03/2014 02:02 PM   Modules accepted: Orders, Medications

## 2014-11-03 NOTE — Telephone Encounter (Signed)
BP's too high on average, Increase norvasc to 5mg  daily. Call us back in 2 weeks with repeat numbers   Zandra Abts MD

## 2014-11-03 NOTE — Telephone Encounter (Signed)
Pt wife made aware. Will take 2 tabs of 2.5 mg daily until finished since was just refilled. Medication list updated and pt wife will call back in 2 weeks with BP readings.

## 2014-11-18 ENCOUNTER — Telehealth: Payer: Self-pay | Admitting: *Deleted

## 2014-11-18 NOTE — Telephone Encounter (Signed)
Pt wife reported BP readings: Will forward to Dr. Harl Bowie.  144/83 142/88 118/67 125/73 131/74

## 2014-11-18 NOTE — Telephone Encounter (Signed)
Numbers overall are acceptable, will continue current meds  Zandra Abts MD

## 2014-11-18 NOTE — Telephone Encounter (Signed)
Made aware

## 2014-12-17 ENCOUNTER — Telehealth: Payer: Self-pay | Admitting: Cardiology

## 2014-12-17 NOTE — Telephone Encounter (Signed)
BP readings after increase of Amlodipine starting on 12/04/14: Will forward to Dr. Harl Bowie 138/80 140/85 132/84 147/83 137/80 131/80 137/81 153/81 141/81 145/81 138/79 128/84

## 2014-12-17 NOTE — Telephone Encounter (Signed)
Mrs. Bordner called to give BP readings.

## 2014-12-18 DIAGNOSIS — I712 Thoracic aortic aneurysm, without rupture: Secondary | ICD-10-CM | POA: Diagnosis not present

## 2014-12-18 NOTE — Telephone Encounter (Signed)
BP's are overall ok, will continue current meds at this time   Zandra Abts MD

## 2014-12-18 NOTE — Telephone Encounter (Signed)
Pt wife made aware.

## 2015-01-05 ENCOUNTER — Ambulatory Visit (HOSPITAL_COMMUNITY)
Admission: RE | Admit: 2015-01-05 | Discharge: 2015-01-05 | Disposition: A | Discharge status: Home / Self Care | Payer: Medicare Other | Source: Ambulatory Visit | Attending: Cardiology | Admitting: Cardiology

## 2015-01-05 DIAGNOSIS — I712 Thoracic aortic aneurysm, without rupture, unspecified: Secondary | ICD-10-CM

## 2015-01-05 LAB — POCT I-STAT CREATININE: Creatinine, Ser: 1.6 mg/dL — ABNORMAL HIGH (ref 0.50–1.35)

## 2015-01-05 MED ORDER — IOHEXOL 350 MG/ML SOLN
100.0000 mL | Freq: Once | INTRAVENOUS | Status: AC | PRN
  Administered 2015-01-05: 100 mL via INTRAVENOUS

## 2015-01-07 DIAGNOSIS — C61 Malignant neoplasm of prostate: Secondary | ICD-10-CM | POA: Diagnosis not present

## 2015-01-08 DIAGNOSIS — K8020 Calculus of gallbladder without cholecystitis without obstruction: Secondary | ICD-10-CM | POA: Diagnosis not present

## 2015-01-08 DIAGNOSIS — N281 Cyst of kidney, acquired: Secondary | ICD-10-CM | POA: Diagnosis not present

## 2015-01-08 DIAGNOSIS — D3502 Benign neoplasm of left adrenal gland: Secondary | ICD-10-CM | POA: Diagnosis not present

## 2015-01-13 DIAGNOSIS — C61 Malignant neoplasm of prostate: Secondary | ICD-10-CM | POA: Diagnosis not present

## 2015-01-14 ENCOUNTER — Telehealth: Payer: Self-pay | Admitting: *Deleted

## 2015-01-14 NOTE — Telephone Encounter (Signed)
Pt wife made aware and that pt had recent MRI to further evaluate abnormal area on kidney. Will let us know what those findings are when they receive results.

## 2015-01-14 NOTE — Telephone Encounter (Signed)
-----   Message from Arnoldo Lenis, MD sent at 01/06/2015 12:34 PM EDT ----- CT shows stable aneurysm. There is an abnormal area on top of his left kidney has gotten slightly larger from last study. Please forward result to pcp to consider further imagning  JBranch MD

## 2015-03-16 ENCOUNTER — Other Ambulatory Visit: Payer: Self-pay | Admitting: Cardiology

## 2015-05-11 DIAGNOSIS — H3341 Traction detachment of retina, right eye: Secondary | ICD-10-CM | POA: Diagnosis not present

## 2015-05-11 DIAGNOSIS — H33051 Total retinal detachment, right eye: Secondary | ICD-10-CM | POA: Diagnosis not present

## 2015-06-23 DIAGNOSIS — M25561 Pain in right knee: Secondary | ICD-10-CM | POA: Diagnosis not present

## 2015-11-04 ENCOUNTER — Encounter: Payer: Self-pay | Admitting: Cardiology

## 2015-11-04 ENCOUNTER — Ambulatory Visit (INDEPENDENT_AMBULATORY_CARE_PROVIDER_SITE_OTHER): Payer: Medicare Other | Admitting: Cardiology

## 2015-11-04 VITALS — BP 118/73 | HR 67 | Ht 67.0 in | Wt 227.0 lb

## 2015-11-04 DIAGNOSIS — I719 Aortic aneurysm of unspecified site, without rupture: Secondary | ICD-10-CM | POA: Diagnosis not present

## 2015-11-04 DIAGNOSIS — R7309 Other abnormal glucose: Secondary | ICD-10-CM | POA: Diagnosis not present

## 2015-11-04 DIAGNOSIS — Z125 Encounter for screening for malignant neoplasm of prostate: Secondary | ICD-10-CM

## 2015-11-04 DIAGNOSIS — R972 Elevated prostate specific antigen [PSA]: Secondary | ICD-10-CM

## 2015-11-04 DIAGNOSIS — I10 Essential (primary) hypertension: Secondary | ICD-10-CM

## 2015-11-04 NOTE — Patient Instructions (Signed)
Your physician wants you to follow-up in: 1 year with Dr. Bryna Colander will receive a reminder letter in the mail two months in advance. If you don't receive a letter, please call our office to schedule the follow-up appointment.  Your physician recommends that you continue on your current medications as directed. Please refer to the Current Medication list given to you today.  TO BE DONE April 2017   Non-Cardiac CT Angiography (CTA), is a special type of CT scan that uses a computer to produce multi-dimensional views of major blood vessels throughout the body. In CT angiography, a contrast material is injected through an IV to help visualize the blood vessels  Your physician recommends that you return for lab work FASTING LIPIDS/TSH/MG/CBC/CMP/HGBA1C/PSA  Thank you for choosing [facility]!!

## 2015-11-04 NOTE — Progress Notes (Addendum)
Patient ID: Steve [name], male   DOB: 12/11/1940, 75 y.o.   MRN: [id number]     Clinical Summary Steve [name] is a 75 y.o.male seen today for follow up of the following medical problems.   1. Thoracic aortic aneurysm - patient had CT chest 12/2013 at [facility] for SOB, noted ascending aorta 4.4 cm aneusym - echo 01/2014 aortic root mildly dilated at 4.2 cm - CTA 01/2015 stable aneurysm at 4.4 cm.   - denies any chest pain since last visit  2. CAD - coronary calcifications noted on CT san - denies any chest pain. No SOB or DOE  3. Adrenal mass - incidental finding noted on recent CT, patient reports followed by pcp.   4. HTN - compliant with meds Past Medical History  Diagnosis Date  . History of urinary retention   . History of urethral stricture   . Bladder neck contracture   . Peyronie disease   . History of prostate cancer S/P SEED IMPLANTS   . COPD (chronic obstructive pulmonary disease) PER CXRAY  10-28-2010  . Urinary incontinence   . Nocturia   . Frequency of urination   . OSA (obstructive sleep apnea) NON-COMPLIANT    does not use cpap  . Complication of anesthesia   . PONV (postoperative nausea and vomiting)      No Known Allergies   Current Outpatient Prescriptions  Medication Sig Dispense Refill  . amLODipine (NORVASC) 5 MG tablet TAKE (1) TABLET BY MOUTH ONCE DAILY. 90 tablet 3  . aspirin EC 81 MG tablet Take 81 mg by mouth daily.    . furosemide (LASIX) 20 MG tablet 1 tablet daily as needed for swelling 90 tablet 3  . ibuprofen (ADVIL,MOTRIN) 200 MG tablet Take 200 mg by mouth every 6 (six) hours as needed. For pain     No current facility-administered medications for this visit.     Past Surgical History  Procedure Laterality Date  . Cysto/ urethral dilation/  urethra balloon dilation / vaporization of prostate and urethral stricture  10-28-2010  . Cysto/ urethral dilation  04-21-2008  . Radioactive seed implants, prostate  06-20-2002  . Cataract  extraction w/ intraocular lens  implant, bilateral    . Prostaglandin injection  2003  . Cystoscopy  01/27/2012    Procedure: CYSTOSCOPY;  Surgeon: Ailene Rud, MD;  Location: [facility];  Service: Urology;  Laterality: N/A;  cysto, cook balloon dilation of  bladder neck contracture gyrus button vaporization  carm   . Transurethral resection of bladder tumor  05/15/2012    Procedure: TRANSURETHRAL RESECTION OF BLADDER TUMOR (TURBT);  Surgeon: Ailene Rud, MD;  Location: [facility];  Service: Urology;  Laterality: N/A;  . Eye surgery      cataract removed bilaterally  . Scleral buckle  10/23/2012    Procedure: SCLERAL BUCKLE;  Surgeon: Hayden Pedro, MD;  Location: [place];  Service: Ophthalmology;  Laterality: Right;  . Gas insertion  10/23/2012    Procedure: INSERTION OF GAS;  Surgeon: Hayden Pedro, MD;  Location: [place];  Service: Ophthalmology;  Laterality: Right;  C3F8  . Photocoagulation with laser  10/23/2012    Procedure: PHOTOCOAGULATION WITH LASER;  Surgeon: Hayden Pedro, MD;  Location: [place];  Service: Ophthalmology;  Laterality: Right;  . 25 gauge pars plana vitrectomy with 20 gauge mvr port Right 12/11/2012    Procedure: 25 GAUGE PARS PLANA VITRECTOMY WITH 20 GAUGE MVR PORT;  Surgeon: Hayden Pedro, MD;  Location: [place] [facility];  Service: Ophthalmology;  Laterality: Right;  Injection of silicone Oil  . Membrane peel Right 12/11/2012    Procedure: MEMBRANE PEEL;  Surgeon: Hayden Pedro, MD;  Location: [place];  Service: Ophthalmology;  Laterality: Right;  . Laser photo ablation Right 12/11/2012    Procedure: LASER PHOTO ABLATION;  Surgeon: Hayden Pedro, MD;  Location: [place];  Service: Ophthalmology;  Laterality: Right;  . Iridectomy Right 12/11/2012    Procedure: IRIDECTOMY;  Surgeon: Hayden Pedro, MD;  Location: [place];  Service: Ophthalmology;  Laterality: Right;  . Perfluorone injection Right 12/11/2012    Procedure: PERFLUORONE INJECTION;  Surgeon: Hayden Pedro, MD;  Location: [place];  Service: Ophthalmology;  Laterality: Right;  Injection and Removal  . Gas/fluid exchange Right 12/11/2012    Procedure: GAS/FLUID EXCHANGE;  Surgeon: Hayden Pedro, MD;  Location: [place];  Service: Ophthalmology;  Laterality: Right;     No Known Allergies   Family history Denies any family history of heart disease   Social History Steve [name] reports that he quit smoking about 34 years ago. His smoking use included Cigarettes. He started smoking about 60 years ago. He has a 40 pack-year smoking history. He has never used smokeless tobacco. Steve [name] reports that he does not drink alcohol.   Review of Systems CONSTITUTIONAL: No weight loss, fever, chills, weakness or fatigue.  HEENT: Eyes: No visual loss, blurred vision, double vision or yellow sclerae.No hearing loss, sneezing, congestion, runny nose or sore throat.  SKIN: No rash or itching.  CARDIOVASCULAR: per HPI RESPIRATORY: No shortness of breath, cough or sputum.  GASTROINTESTINAL: No anorexia, nausea, vomiting or diarrhea. No abdominal pain or blood.  GENITOURINARY: No burning on urination, no polyuria NEUROLOGICAL: No headache, dizziness, syncope, paralysis, ataxia, numbness or tingling in the extremities. No change in bowel or bladder control.  MUSCULOSKELETAL: No muscle, back pain, joint pain or stiffness.  LYMPHATICS: No enlarged nodes. No history of splenectomy.  PSYCHIATRIC: No history of depression or anxiety.  ENDOCRINOLOGIC: No reports of sweating, cold or heat intolerance. No polyuria or polydipsia.  Marland Kitchen   Physical Examination Filed Vitals:   11/04/15 1039  BP: 118/73  Pulse: 67   Filed Vitals:   11/04/15 1039  Height: '5\' 7"'$  (1.702 m)  Weight: 227 lb (102.967 kg)    Gen: resting comfortably, no acute distress HEENT: no scleral icterus, pupils equal round and reactive, no palptable cervical adenopathy,  CV: RRR, no m/r/g, no jvd Resp: Clear to auscultation  bilaterally GI: abdomen is soft, non-tender, non-distended, normal bowel sounds, no hepatosplenomegaly MSK: extremities are warm, no edema.  Skin: warm, no rash Neuro:  no focal deficits Psych: appropriate affect   Diagnostic Studies 01/2014 Echo LVEF 60-65%, possible diastolic dysfunction, mild AI, mildly dilated aorta.  01/2015 CTA chest IMPRESSION: 1. Ascending thoracic aortic aneurysm is stable. Recommend annual imaging followup by CTA or MRA. This recommendation follows 2010 ACCF/AHA/AATS/ACR/ASA/SCA/SCAI/SIR/STS/SVM Guidelines for the Diagnosis and Management of Patients with Thoracic Aortic Disease. Circulation. 2010; 121: U542-H062. 2. Left adrenal mass, incompletely imaged, much larger than on 02/04/2004. Further evaluation with MR abdomen without and with contrast is recommended. These results will be called to the ordering clinician or representative by the Radiologist Assistant, and communication documented in the PACS or zVision Dashboard. 3. Coronary artery calcification. 4. Cholelithiasis.  11/04/15 Clinic EKG (performed and reviewed in clinic) SR, frequent PACs   Assessment and Plan  1. Thoracic aneurysm - continue to follow with  serial CT scans, plan for repeat 01/2016  2. HTN - at goal, continue current meds  3. LE edema - occasional, will provide prn lasix      Arnoldo Lenis, M.D.

## 2015-12-08 ENCOUNTER — Encounter: Payer: Self-pay | Admitting: *Deleted

## 2015-12-21 DIAGNOSIS — I719 Aortic aneurysm of unspecified site, without rupture: Secondary | ICD-10-CM | POA: Diagnosis not present

## 2015-12-21 DIAGNOSIS — R7309 Other abnormal glucose: Secondary | ICD-10-CM | POA: Diagnosis not present

## 2015-12-21 DIAGNOSIS — Z125 Encounter for screening for malignant neoplasm of prostate: Secondary | ICD-10-CM | POA: Diagnosis not present

## 2015-12-21 DIAGNOSIS — I10 Essential (primary) hypertension: Secondary | ICD-10-CM | POA: Diagnosis not present

## 2015-12-28 ENCOUNTER — Telehealth: Payer: Self-pay | Admitting: *Deleted

## 2015-12-28 MED ORDER — ATORVASTATIN CALCIUM 40 MG PO TABS: 40.0000 mg | ORAL_TABLET | Freq: Every day | ORAL | Status: DC

## 2015-12-28 NOTE — Telephone Encounter (Signed)
-----   Message from Arnoldo Lenis, MD sent at 12/28/2015  9:48 AM EDT ----- Labs show cholesterol is too high, please start lipitor '40mg'$  daily   Zandra Abts MD

## 2015-12-28 NOTE — Telephone Encounter (Signed)
Pt aware, medication sent to pharmacy, routed to pcp

## 2016-01-04 ENCOUNTER — Ambulatory Visit (HOSPITAL_COMMUNITY)
Admission: RE | Admit: 2016-01-04 | Discharge: 2016-01-04 | Disposition: A | Discharge status: Home / Self Care | Payer: Medicare Other | Source: Ambulatory Visit | Attending: Cardiology | Admitting: Cardiology

## 2016-01-04 DIAGNOSIS — I719 Aortic aneurysm of unspecified site, without rupture: Secondary | ICD-10-CM | POA: Insufficient documentation

## 2016-01-04 DIAGNOSIS — I712 Thoracic aortic aneurysm, without rupture: Secondary | ICD-10-CM | POA: Diagnosis not present

## 2016-01-04 LAB — POCT I-STAT CREATININE: Creatinine, Ser: 1.4 mg/dL — ABNORMAL HIGH (ref 0.61–1.24)

## 2016-01-04 MED ORDER — IOHEXOL 350 MG/ML SOLN
100.0000 mL | Freq: Once | INTRAVENOUS | Status: AC | PRN
  Administered 2016-01-04: 100 mL via INTRAVENOUS

## 2016-01-06 ENCOUNTER — Telehealth: Payer: Self-pay | Admitting: *Deleted

## 2016-01-06 NOTE — Telephone Encounter (Signed)
-----   Message from Arnoldo Lenis, MD sent at 01/04/2016 11:59 AM EDT ----- CT of his chest shows that the aneurysm and just slightly larger than before. It is recommended to repeat CT scan in 6 months, please arrange a CTA of chest for October 2017  Zandra Abts MD

## 2016-01-06 NOTE — Telephone Encounter (Signed)
Pt aware, routed to pcp. Will recall repeat chest CT in 6 months

## 2016-04-13 ENCOUNTER — Other Ambulatory Visit: Payer: Self-pay | Admitting: Cardiology

## 2016-05-06 DIAGNOSIS — R05 Cough: Secondary | ICD-10-CM | POA: Diagnosis not present

## 2016-05-06 DIAGNOSIS — Z6836 Body mass index (BMI) 36.0-36.9, adult: Secondary | ICD-10-CM | POA: Diagnosis not present

## 2016-05-06 DIAGNOSIS — J440 Chronic obstructive pulmonary disease with acute lower respiratory infection: Secondary | ICD-10-CM | POA: Diagnosis not present

## 2016-07-01 DIAGNOSIS — S7001XA Contusion of right hip, initial encounter: Secondary | ICD-10-CM | POA: Diagnosis not present

## 2016-07-01 DIAGNOSIS — W010XXA Fall on same level from slipping, tripping and stumbling without subsequent striking against object, initial encounter: Secondary | ICD-10-CM | POA: Diagnosis not present

## 2016-07-01 DIAGNOSIS — M25551 Pain in right hip: Secondary | ICD-10-CM | POA: Diagnosis not present

## 2016-07-01 DIAGNOSIS — S79911A Unspecified injury of right hip, initial encounter: Secondary | ICD-10-CM | POA: Diagnosis not present

## 2016-07-12 ENCOUNTER — Telehealth: Payer: Self-pay | Admitting: *Deleted

## 2016-07-12 ENCOUNTER — Encounter: Payer: Self-pay | Admitting: *Deleted

## 2016-07-12 DIAGNOSIS — I719 Aortic aneurysm of unspecified site, without rupture: Secondary | ICD-10-CM

## 2016-07-12 NOTE — Telephone Encounter (Signed)
Pt will have labs done at 108 - orders placed for chest CT

## 2016-07-14 ENCOUNTER — Encounter: Payer: Self-pay | Admitting: Cardiology

## 2016-07-20 DIAGNOSIS — I712 Thoracic aortic aneurysm, without rupture: Secondary | ICD-10-CM | POA: Diagnosis not present

## 2016-08-01 ENCOUNTER — Ambulatory Visit (HOSPITAL_COMMUNITY)
Admission: RE | Admit: 2016-08-01 | Discharge: 2016-08-01 | Disposition: A | Discharge status: Home / Self Care | Payer: Medicare Other | Source: Ambulatory Visit | Attending: Cardiology | Admitting: Cardiology

## 2016-08-01 DIAGNOSIS — I719 Aortic aneurysm of unspecified site, without rupture: Secondary | ICD-10-CM

## 2016-08-01 DIAGNOSIS — I2510 Atherosclerotic heart disease of native coronary artery without angina pectoris: Secondary | ICD-10-CM | POA: Diagnosis not present

## 2016-08-01 DIAGNOSIS — I700 Atherosclerosis of aorta: Secondary | ICD-10-CM | POA: Insufficient documentation

## 2016-08-01 DIAGNOSIS — R918 Other nonspecific abnormal finding of lung field: Secondary | ICD-10-CM | POA: Diagnosis not present

## 2016-08-01 DIAGNOSIS — I712 Thoracic aortic aneurysm, without rupture: Secondary | ICD-10-CM | POA: Diagnosis not present

## 2016-08-01 LAB — POCT I-STAT CREATININE: Creatinine, Ser: 1.6 mg/dL — ABNORMAL HIGH (ref 0.61–1.24)

## 2016-08-01 MED ORDER — IOPAMIDOL (ISOVUE-370) INJECTION 76%
100.0000 mL | Freq: Once | INTRAVENOUS | Status: AC | PRN
  Administered 2016-08-01: 80 mL via INTRAVENOUS

## 2016-08-04 ENCOUNTER — Telehealth: Payer: Self-pay | Admitting: *Deleted

## 2016-08-04 NOTE — Telephone Encounter (Signed)
-----   Message from Arnoldo Lenis, MD sent at 08/01/2016  7:01 PM EDT ----- CT scan shows aneursym is stable in size. We will continue to monitor at this time.   J BrancH MD

## 2016-08-04 NOTE — Telephone Encounter (Signed)
Pt aware - routed to pcp  

## 2016-08-31 DIAGNOSIS — R05 Cough: Secondary | ICD-10-CM | POA: Diagnosis not present

## 2016-08-31 DIAGNOSIS — J209 Acute bronchitis, unspecified: Secondary | ICD-10-CM | POA: Diagnosis not present

## 2016-12-01 ENCOUNTER — Ambulatory Visit (INDEPENDENT_AMBULATORY_CARE_PROVIDER_SITE_OTHER): Payer: Medicare Other | Admitting: Cardiology

## 2016-12-01 ENCOUNTER — Encounter: Payer: Self-pay | Admitting: Cardiology

## 2016-12-01 VITALS — BP 129/71 | HR 69 | Ht 67.0 in | Wt 232.0 lb

## 2016-12-01 DIAGNOSIS — I719 Aortic aneurysm of unspecified site, without rupture: Secondary | ICD-10-CM

## 2016-12-01 DIAGNOSIS — R7309 Other abnormal glucose: Secondary | ICD-10-CM

## 2016-12-01 DIAGNOSIS — I10 Essential (primary) hypertension: Secondary | ICD-10-CM | POA: Insufficient documentation

## 2016-12-01 DIAGNOSIS — R600 Localized edema: Secondary | ICD-10-CM | POA: Diagnosis not present

## 2016-12-01 MED ORDER — METOPROLOL TARTRATE 25 MG PO TABS: 12.5000 mg | ORAL_TABLET | Freq: Two times a day (BID) | ORAL | 3 refills | Status: DC

## 2016-12-01 NOTE — Patient Instructions (Signed)
Your physician wants you to follow-up in: [place] DR. BRANCH You will receive a reminder letter in the mail two months in advance. If you don't receive a letter, please call our office to schedule the follow-up appointment.  Your physician has recommended you make the following change in your medication:   START LOPRESSOR (METOPROLOL) 12.5 MG TWICE DAILY  TAKE LASIX DAILY FOR 3 DAYS THEN RESUME TAKING AS NEEDED   Your physician recommends that you return for lab work BMP/CBC/TSH/LIPIDS/HGBA1C/MG - PLEASE FAST PRIOR TO LAB WORK  Thank you for choosing [place]!!

## 2016-12-01 NOTE — Progress Notes (Signed)
Clinical Summary Steve [name] is a 76 y.o.male seen today for follow up of the following medical problems.   1. Thoracic aortic aneurysm - patient had CT chest 12/2013 at [facility] for SOB, noted ascending aorta 4.4 cm aneusym - echo 01/2014 aortic root mildly dilated at 4.2 cm - CTA 01/2015 stable aneurysm at 4.4 cm.  - 07/2016 CTA stable 4.4 cm.  - no recent chest pain  2. CAD - coronary calcifications noted on CT san - no symptoms of chest pain, SOB or DOE  3. Adrenal mass - incidental finding noted on recent CT, patient reports followed by pcp.   4. HTN - he is compliant with meds   Past Medical History:  Diagnosis Date  . Bladder neck contracture   . Complication of anesthesia   . COPD (chronic obstructive pulmonary disease) ([place]) PER CXRAY  10-28-2010  . Frequency of urination   . History of prostate cancer S/P SEED IMPLANTS   . History of urethral stricture   . History of urinary retention   . Nocturia   . OSA (obstructive sleep apnea) NON-COMPLIANT   does not use cpap  . Peyronie disease   . PONV (postoperative nausea and vomiting)   . Urinary incontinence      No Known Allergies   Current Outpatient Prescriptions  Medication Sig Dispense Refill  . amLODipine (NORVASC) 5 MG tablet TAKE (1) TABLET BY MOUTH ONCE DAILY. 90 tablet 3  . aspirin EC 81 MG tablet Take 81 mg by mouth every other day.     Marland Kitchen atorvastatin (LIPITOR) 40 MG tablet Take 1 tablet (40 mg total) by mouth daily. 90 tablet 3  . furosemide (LASIX) 20 MG tablet 1 tablet daily as needed for swelling 90 tablet 3  . ibuprofen (ADVIL,MOTRIN) 200 MG tablet Take 200 mg by mouth every 6 (six) hours as needed. For pain     No current facility-administered medications for this visit.      Past Surgical History:  Procedure Laterality Date  . [place] VITRECTOMY WITH 20 GAUGE MVR PORT Right 12/11/2012   Procedure: 25 GAUGE PARS PLANA VITRECTOMY WITH 20 GAUGE MVR PORT;  Surgeon: Hayden Pedro, MD;  Location: [place];  Service: Ophthalmology;  Laterality: Right;  Injection of silicone Oil  . CATARACT EXTRACTION W/ INTRAOCULAR LENS  IMPLANT, BILATERAL    . CYSTO/ URETHRAL DILATION  04-21-2008  . CYSTO/ URETHRAL DILATION/  URETHRA BALLOON DILATION / VAPORIZATION OF PROSTATE AND URETHRAL STRICTURE  10-28-2010  . CYSTOSCOPY  01/27/2012   Procedure: CYSTOSCOPY;  Surgeon: Ailene Rud, MD;  Location: [facility];  Service: Urology;  Laterality: N/A;  cysto, cook balloon dilation of  bladder neck contracture gyrus button vaporization  carm   . EYE SURGERY     cataract removed bilaterally  . GAS INSERTION  10/23/2012   Procedure: INSERTION OF GAS;  Surgeon: Hayden Pedro, MD;  Location: [place];  Service: Ophthalmology;  Laterality: Right;  C3F8  . GAS/FLUID EXCHANGE Right 12/11/2012   Procedure: GAS/FLUID EXCHANGE;  Surgeon: Hayden Pedro, MD;  Location: [place];  Service: Ophthalmology;  Laterality: Right;  . IRIDECTOMY Right 12/11/2012   Procedure: IRIDECTOMY;  Surgeon: Hayden Pedro, MD;  Location: [place];  Service: Ophthalmology;  Laterality: Right;  . LASER PHOTO ABLATION Right 12/11/2012   Procedure: LASER PHOTO ABLATION;  Surgeon: Hayden Pedro, MD;  Location: [place];  Service: Ophthalmology;  Laterality: Right;  .  MEMBRANE PEEL Right 12/11/2012   Procedure: MEMBRANE PEEL;  Surgeon: Hayden Pedro, MD;  Location: [place];  Service: Ophthalmology;  Laterality: Right;  . [place] INJECTION Right 12/11/2012   Procedure: PERFLUORONE INJECTION;  Surgeon: Hayden Pedro, MD;  Location: [place];  Service: Ophthalmology;  Laterality: Right;  Injection and Removal  . PHOTOCOAGULATION WITH LASER  10/23/2012   Procedure: PHOTOCOAGULATION WITH LASER;  Surgeon: Hayden Pedro, MD;  Location: [place];  Service: Ophthalmology;  Laterality: Right;  . [place] INJECTION  2003  . RADIOACTIVE SEED IMPLANTS, PROSTATE  06-20-2002  . SCLERAL BUCKLE  10/23/2012   Procedure:  SCLERAL BUCKLE;  Surgeon: Hayden Pedro, MD;  Location: [place];  Service: Ophthalmology;  Laterality: Right;  . TRANSURETHRAL RESECTION OF BLADDER TUMOR  05/15/2012   Procedure: TRANSURETHRAL RESECTION OF BLADDER TUMOR (TURBT);  Surgeon: Ailene Rud, MD;  Location: [facility];  Service: Urology;  Laterality: N/A;     No Known Allergies    No family history on file.   Social History Steve [name] reports that he quit smoking about 35 years ago. His smoking use included Cigarettes. He started smoking about 61 years ago. He has a 40.00 pack-year smoking history. He has never used smokeless tobacco. Steve [name] reports that he does not drink alcohol.   Review of Systems CONSTITUTIONAL: No weight loss, fever, chills, weakness or fatigue.  HEENT: Eyes: No visual loss, blurred vision, double vision or yellow sclerae.No hearing loss, sneezing, congestion, runny nose or sore throat.  SKIN: No rash or itching.  CARDIOVASCULAR: per hpi RESPIRATORY: No shortness of breath, cough or sputum.  GASTROINTESTINAL: No anorexia, nausea, vomiting or diarrhea. No abdominal pain or blood.  GENITOURINARY: No burning on urination, no polyuria NEUROLOGICAL: No headache, dizziness, syncope, paralysis, ataxia, numbness or tingling in the extremities. No change in bowel or bladder control.  MUSCULOSKELETAL: No muscle, back pain, joint pain or stiffness.  LYMPHATICS: No enlarged nodes. No history of splenectomy.  PSYCHIATRIC: No history of depression or anxiety.  ENDOCRINOLOGIC: No reports of sweating, cold or heat intolerance. No polyuria or polydipsia.  Marland Kitchen   Physical Examination Vitals:   12/01/16 0934  BP: 129/71  Pulse: 69   Vitals:   12/01/16 0934  Weight: 232 lb (105.2 kg)  Height: '5\' 7"'$  (1.702 m)     Gen: resting comfortably, no acute distress HEENT: no scleral icterus, pupils equal round and reactive, no palptable cervical adenopathy,  CV: RRR, no m/r/g, no jvd Resp: Clear to auscultation  bilaterally GI: abdomen is soft, non-tender, non-distended, normal bowel sounds, no hepatosplenomegaly MSK: extremities are warm, no edema.  Skin: warm, no rash Neuro:  no focal deficits Psych: appropriate affect   Diagnostic Studies  01/2014 Echo LVEF 60-65%, possible diastolic dysfunction, mild AI, mildly dilated aorta.  01/2015 CTA chest IMPRESSION: 1. Ascending thoracic aortic aneurysm is stable. Recommend annual imaging followup by CTA or MRA. This recommendation follows 2010 ACCF/AHA/AATS/ACR/ASA/SCA/SCAI/SIR/STS/SVM Guidelines for the Diagnosis and Management of Patients with Thoracic Aortic Disease. Circulation. 2010; 121: W431-V400. 2. Left adrenal mass, incompletely imaged, much larger than on 02/04/2004. Further evaluation with MR abdomen without and with contrast is recommended. These results will be called to the ordering clinician or representative by the Radiologist Assistant, and communication documented in the PACS or zVision Dashboard. 3. Coronary artery calcification. 4. Cholelithiasis.  07/2016 CTA chest IMPRESSION: No acute finding.  Re- demonstration of ascending thoracic aneurysm, measuring 4.4 cm on the current study, unchanged from the prior. Recommend  annual imaging followup by CTA or MRA. This recommendation follows 2010 ACCF/AHA/AATS/ACR/ASA/SCA/SCAI/SIR/STS/SVM Guidelines for the Diagnosis and Management of Patients with Thoracic Aortic Disease. Circulation. 2010; 121: S138-I719.   Assessment and Plan  1. Thoracic aneurysm - continue to follow with serial CT scans, plan for repeat CT later this year - start lopressor 12.'5mg'$  bid  2. HTN -bp is at goal, continue current meds  3. LE edema - continue prn lasix   Check annual labs. F/ir 6 months, order CTA chest at that time.    Arnoldo Lenis, M.D.

## 2016-12-21 DIAGNOSIS — J440 Chronic obstructive pulmonary disease with acute lower respiratory infection: Secondary | ICD-10-CM | POA: Diagnosis not present

## 2016-12-21 DIAGNOSIS — I712 Thoracic aortic aneurysm, without rupture: Secondary | ICD-10-CM | POA: Diagnosis not present

## 2016-12-21 DIAGNOSIS — I10 Essential (primary) hypertension: Secondary | ICD-10-CM | POA: Diagnosis not present

## 2016-12-21 DIAGNOSIS — R7309 Other abnormal glucose: Secondary | ICD-10-CM | POA: Diagnosis not present

## 2017-01-03 ENCOUNTER — Other Ambulatory Visit: Payer: Self-pay | Admitting: Cardiology

## 2017-02-02 DIAGNOSIS — M79672 Pain in left foot: Secondary | ICD-10-CM | POA: Diagnosis not present

## 2017-02-02 DIAGNOSIS — S92412A Displaced fracture of proximal phalanx of left great toe, initial encounter for closed fracture: Secondary | ICD-10-CM | POA: Diagnosis not present

## 2017-02-02 DIAGNOSIS — S9782XA Crushing injury of left foot, initial encounter: Secondary | ICD-10-CM | POA: Diagnosis not present

## 2017-02-02 DIAGNOSIS — S91302A Unspecified open wound, left foot, initial encounter: Secondary | ICD-10-CM | POA: Diagnosis not present

## 2017-02-02 DIAGNOSIS — Z23 Encounter for immunization: Secondary | ICD-10-CM | POA: Diagnosis not present

## 2017-02-03 DIAGNOSIS — S92412A Displaced fracture of proximal phalanx of left great toe, initial encounter for closed fracture: Secondary | ICD-10-CM | POA: Diagnosis not present

## 2017-02-03 DIAGNOSIS — S9032XA Contusion of left foot, initial encounter: Secondary | ICD-10-CM | POA: Diagnosis not present

## 2017-02-07 DIAGNOSIS — S92412D Displaced fracture of proximal phalanx of left great toe, subsequent encounter for fracture with routine healing: Secondary | ICD-10-CM | POA: Diagnosis not present

## 2017-02-07 DIAGNOSIS — S9032XD Contusion of left foot, subsequent encounter: Secondary | ICD-10-CM | POA: Diagnosis not present

## 2017-02-09 DIAGNOSIS — I10 Essential (primary) hypertension: Secondary | ICD-10-CM | POA: Diagnosis not present

## 2017-02-09 DIAGNOSIS — E7800 Pure hypercholesterolemia, unspecified: Secondary | ICD-10-CM | POA: Diagnosis not present

## 2017-02-09 DIAGNOSIS — S9032XA Contusion of left foot, initial encounter: Secondary | ICD-10-CM | POA: Diagnosis not present

## 2017-02-09 DIAGNOSIS — Z8546 Personal history of malignant neoplasm of prostate: Secondary | ICD-10-CM | POA: Diagnosis not present

## 2017-02-09 DIAGNOSIS — Z7982 Long term (current) use of aspirin: Secondary | ICD-10-CM | POA: Diagnosis not present

## 2017-02-09 DIAGNOSIS — Z79899 Other long term (current) drug therapy: Secondary | ICD-10-CM | POA: Diagnosis not present

## 2017-02-09 DIAGNOSIS — L97521 Non-pressure chronic ulcer of other part of left foot limited to breakdown of skin: Secondary | ICD-10-CM | POA: Diagnosis not present

## 2017-02-09 DIAGNOSIS — L97528 Non-pressure chronic ulcer of other part of left foot with other specified severity: Secondary | ICD-10-CM | POA: Diagnosis not present

## 2017-02-14 DIAGNOSIS — S9032XD Contusion of left foot, subsequent encounter: Secondary | ICD-10-CM | POA: Diagnosis not present

## 2017-02-14 DIAGNOSIS — Z7982 Long term (current) use of aspirin: Secondary | ICD-10-CM | POA: Diagnosis not present

## 2017-02-14 DIAGNOSIS — S9032XA Contusion of left foot, initial encounter: Secondary | ICD-10-CM | POA: Diagnosis not present

## 2017-02-14 DIAGNOSIS — L97521 Non-pressure chronic ulcer of other part of left foot limited to breakdown of skin: Secondary | ICD-10-CM | POA: Diagnosis not present

## 2017-02-14 DIAGNOSIS — I10 Essential (primary) hypertension: Secondary | ICD-10-CM | POA: Diagnosis not present

## 2017-02-14 DIAGNOSIS — Z79899 Other long term (current) drug therapy: Secondary | ICD-10-CM | POA: Diagnosis not present

## 2017-02-14 DIAGNOSIS — L97528 Non-pressure chronic ulcer of other part of left foot with other specified severity: Secondary | ICD-10-CM | POA: Diagnosis not present

## 2017-02-14 DIAGNOSIS — E7800 Pure hypercholesterolemia, unspecified: Secondary | ICD-10-CM | POA: Diagnosis not present

## 2017-02-15 DIAGNOSIS — S9782XA Crushing injury of left foot, initial encounter: Secondary | ICD-10-CM | POA: Diagnosis not present

## 2017-02-15 DIAGNOSIS — S92412A Displaced fracture of proximal phalanx of left great toe, initial encounter for closed fracture: Secondary | ICD-10-CM | POA: Diagnosis not present

## 2017-02-28 DIAGNOSIS — I10 Essential (primary) hypertension: Secondary | ICD-10-CM | POA: Diagnosis not present

## 2017-02-28 DIAGNOSIS — Z7982 Long term (current) use of aspirin: Secondary | ICD-10-CM | POA: Diagnosis not present

## 2017-02-28 DIAGNOSIS — S9032XD Contusion of left foot, subsequent encounter: Secondary | ICD-10-CM | POA: Diagnosis not present

## 2017-02-28 DIAGNOSIS — L97521 Non-pressure chronic ulcer of other part of left foot limited to breakdown of skin: Secondary | ICD-10-CM | POA: Diagnosis not present

## 2017-02-28 DIAGNOSIS — E7800 Pure hypercholesterolemia, unspecified: Secondary | ICD-10-CM | POA: Diagnosis not present

## 2017-02-28 DIAGNOSIS — Z79899 Other long term (current) drug therapy: Secondary | ICD-10-CM | POA: Diagnosis not present

## 2017-02-28 DIAGNOSIS — L97528 Non-pressure chronic ulcer of other part of left foot with other specified severity: Secondary | ICD-10-CM | POA: Diagnosis not present

## 2017-02-28 DIAGNOSIS — S9032XA Contusion of left foot, initial encounter: Secondary | ICD-10-CM | POA: Diagnosis not present

## 2017-03-07 DIAGNOSIS — L97528 Non-pressure chronic ulcer of other part of left foot with other specified severity: Secondary | ICD-10-CM | POA: Diagnosis not present

## 2017-03-07 DIAGNOSIS — L97521 Non-pressure chronic ulcer of other part of left foot limited to breakdown of skin: Secondary | ICD-10-CM | POA: Diagnosis not present

## 2017-03-07 DIAGNOSIS — S9032XD Contusion of left foot, subsequent encounter: Secondary | ICD-10-CM | POA: Diagnosis not present

## 2017-03-14 DIAGNOSIS — L97528 Non-pressure chronic ulcer of other part of left foot with other specified severity: Secondary | ICD-10-CM | POA: Diagnosis not present

## 2017-03-14 DIAGNOSIS — L97522 Non-pressure chronic ulcer of other part of left foot with fat layer exposed: Secondary | ICD-10-CM | POA: Diagnosis not present

## 2017-03-30 ENCOUNTER — Other Ambulatory Visit: Payer: Self-pay | Admitting: Cardiology

## 2017-04-03 IMAGING — CT CT ANGIO CHEST
3 of 7 series · 18 of 46 positions shown · IV contrast (APPLIED)
Comparison: 01/04/2016, 01/05/2015, 12/30/2013

CLINICAL DATA: 75-year-old male with a history of known thoracic
aneurysm

EXAM:
CT ANGIOGRAPHY CHEST WITHOUT AND WITH CONTRAST
TECHNIQUE: Multidetector CT imaging of the chest was performed before contrast
bolus, and then again using the standard protocol during bolus
administration of intravenous contrast. Multiplanar CT image
reconstructions and MIPs were obtained to evaluate the vascular
anatomy.
CONTRAST:  80 cc Isovue 370

[Series 6: axial arterial · axial · arterial · 0.79mm/px · z∈[+122,+428]mm · 13 of 120 slices shown]
[im 9/120  lung]
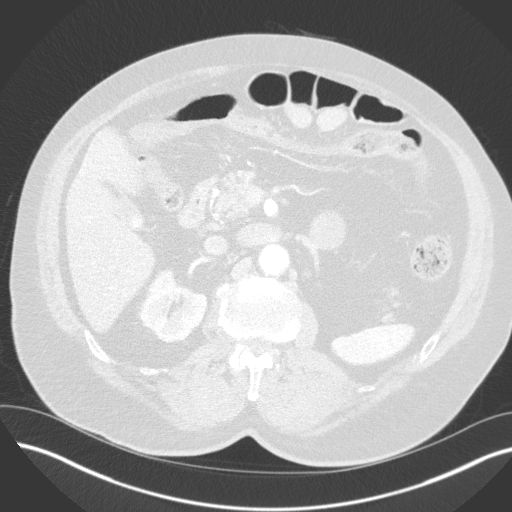
[im 18/120  soft-tissue]
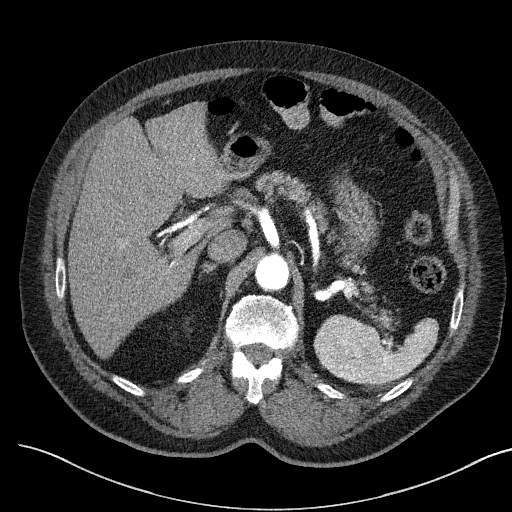
[im 26/120  lung]
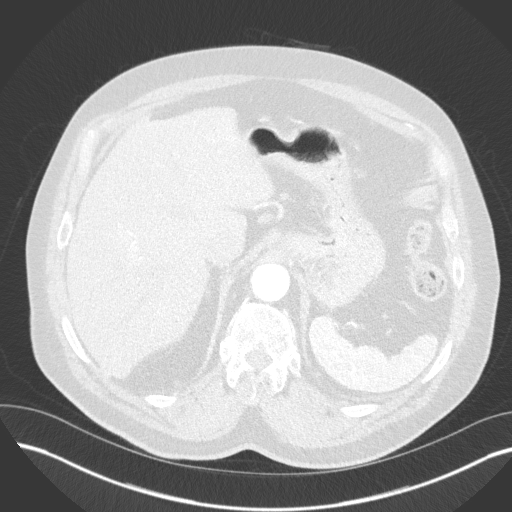
[im 35/120  soft-tissue]
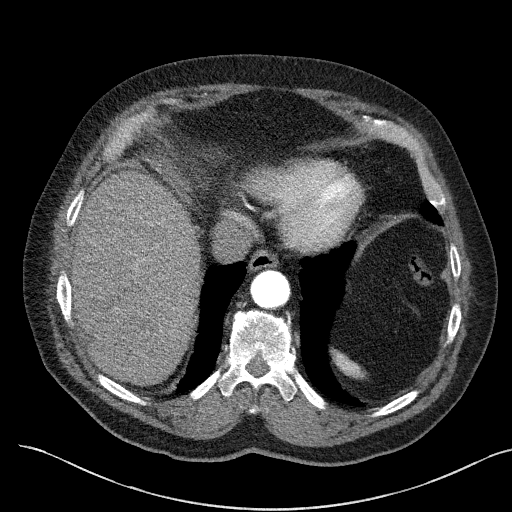
[im 43/120  lung]
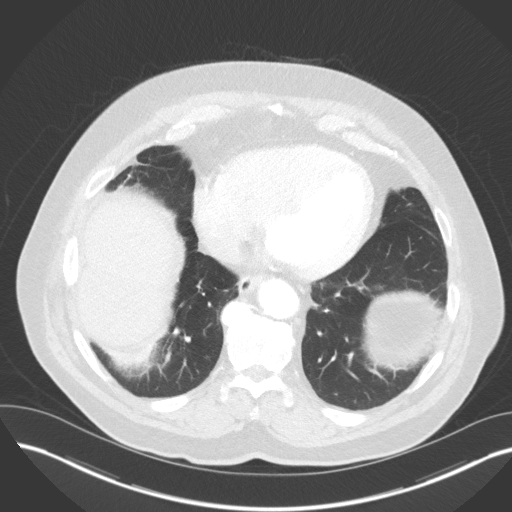
[im 52/120  soft-tissue]
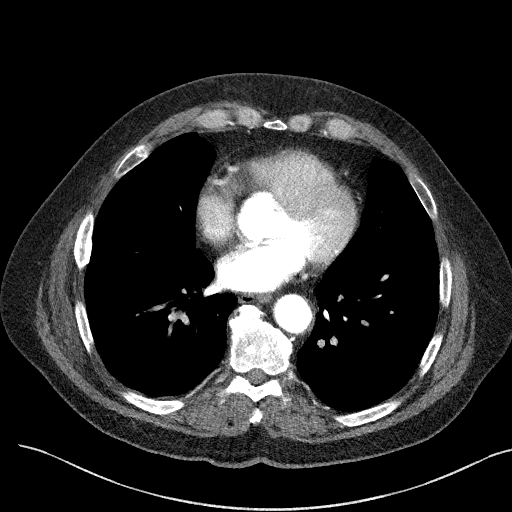
[im 60/120  lung]
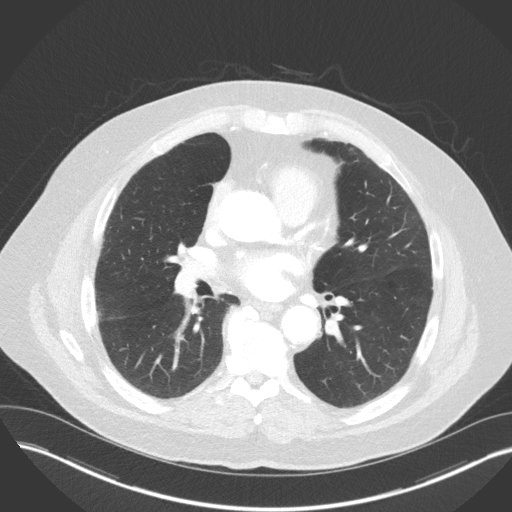
[im 69/120  soft-tissue]
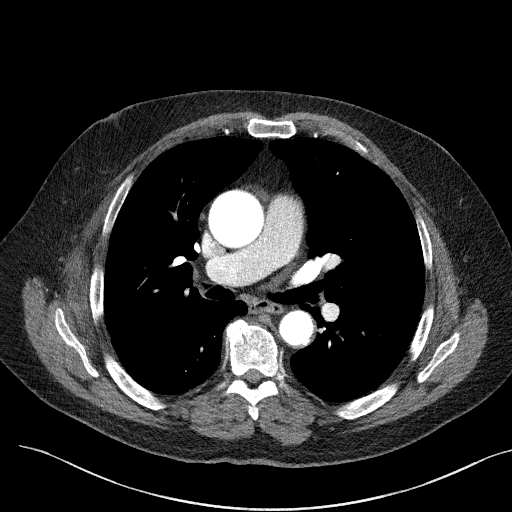
[im 77/120  lung]
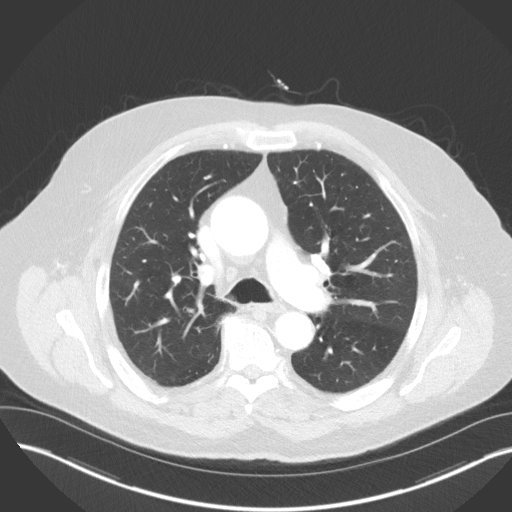
[im 86/120  soft-tissue]
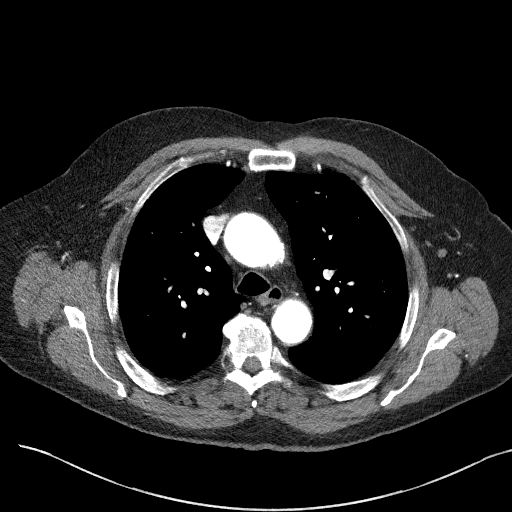
[im 94/120  lung]
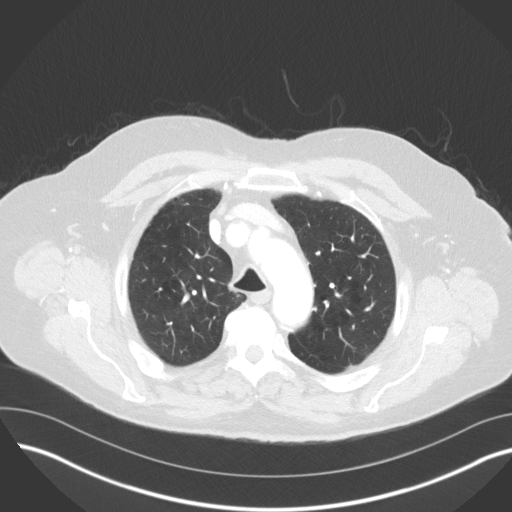
[im 103/120  soft-tissue]
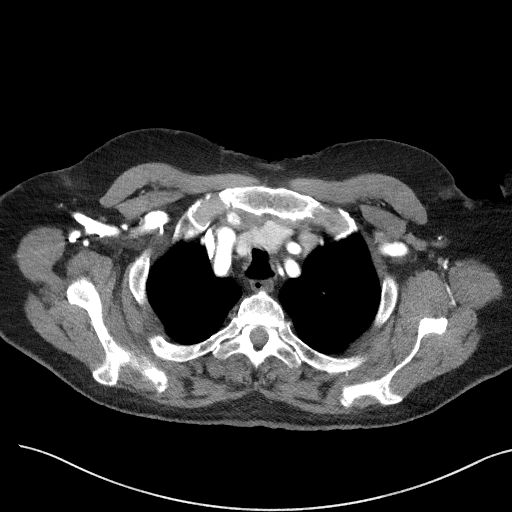
[im 111/120  lung]
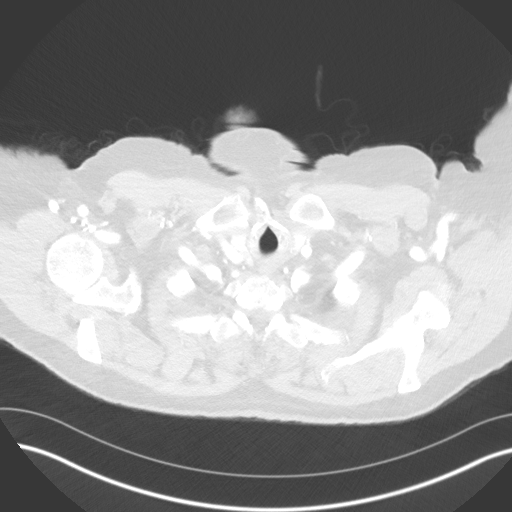

[Series 7: lung · axial · 0.79mm/px · z∈[+140,+185]mm · 2 of 72 slices shown]
[im 9/72  soft-tissue]
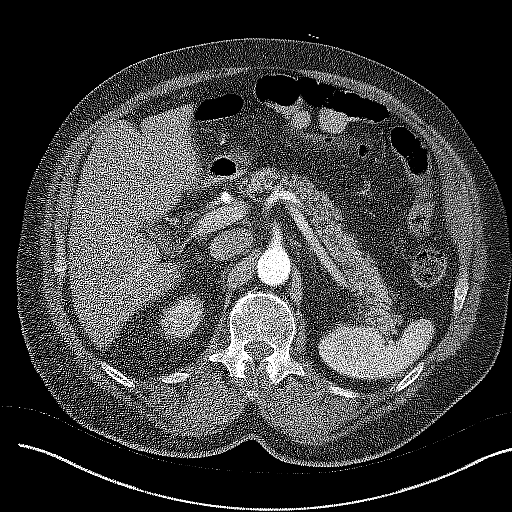
[im 18/72  soft-tissue]
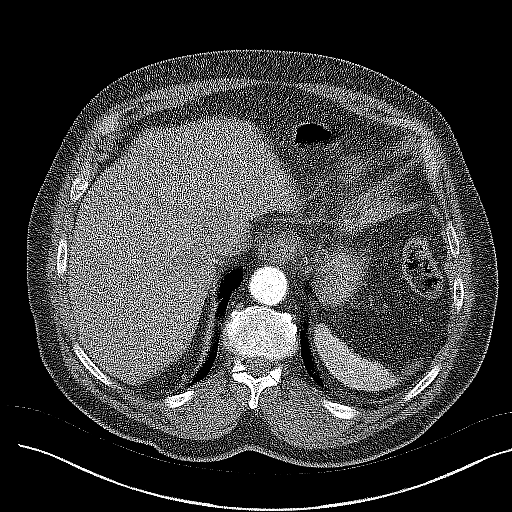

[Series 8: coronals · coronal · 0.72mm/px · 3 of 176 slices shown]
[im 44/176  soft-tissue]
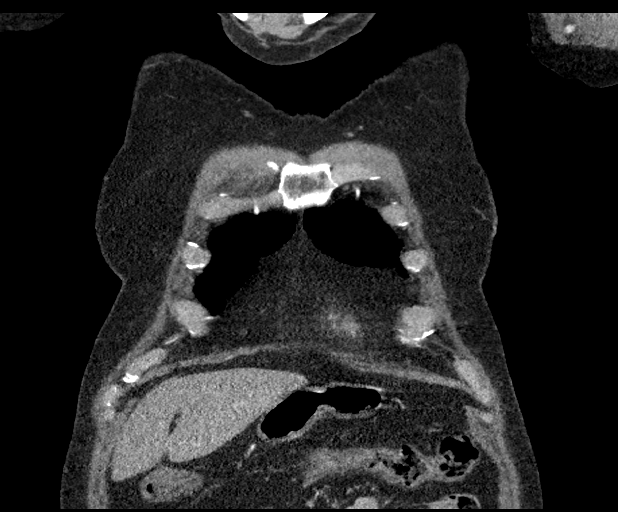
[im 88/176  soft-tissue]
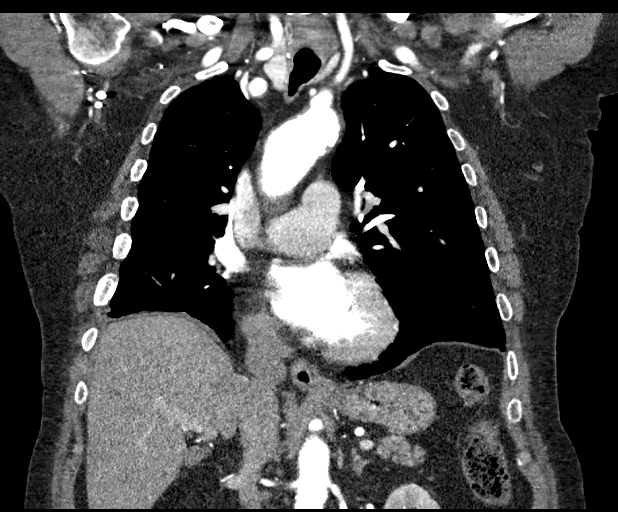
[im 132/176  soft-tissue]
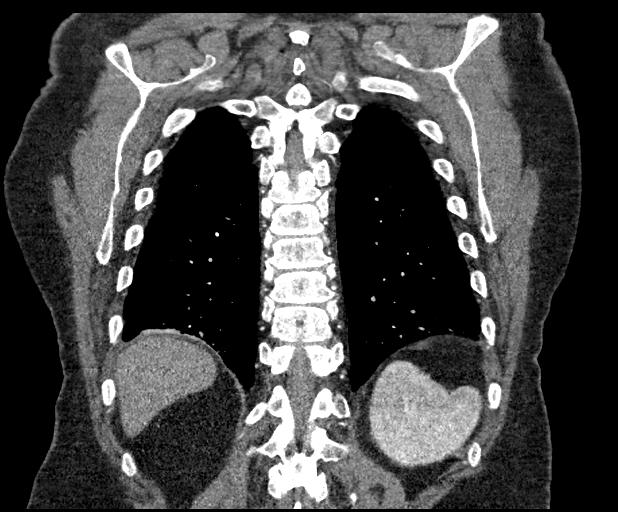

[18 of 46 positions shown; findings below may reference images not displayed]

FINDINGS: Cardiovascular: Heart size within normal limits. No pericardial
fluid/ thickening.

Calcifications of the left main, left anterior descending coronary
arteries.

Similar configuration of the thoracic aorta. Greatest diameter of
the ascending aorta measures 4.4 cm on the current CT. No dissection
flap. No periaortic fluid or inflammatory changes.

Similar appearance of mixed calcified and soft plaque of the
thoracic aorta.

Branch vessels patent.

Timing of the contrast bolus precludes evaluation of the pulmonary
arteries.

Mediastinum/Nodes: No enlarged mediastinal, hilar, or axillary lymph
nodes. Thyroid gland, trachea, and esophagus demonstrate no
significant findings.

Lungs/Pleura: Lungs are clear. No pleural effusion or pneumothorax.
Calcified granuloma at the right lung base is unchanged.

Upper Abdomen: Re- demonstration of adrenal adenoma.

Musculoskeletal: No chest wall abnormality. No acute or significant
osseous findings.

Review of the MIP images confirms the above findings.
IMPRESSION: No acute finding.

Re- demonstration of ascending thoracic aneurysm, measuring 4.4 cm
on the current study, unchanged from the prior. Recommend annual
imaging followup by CTA or MRA. This recommendation follows 2848
ACCF/AHA/AATS/ACR/ASA/SCA/[place]Y/SOU/DREY Guidelines for the
Diagnosis and Management of Patients with Thoracic Aortic Disease.
Circulation. 2848; 121: e266-e369.

Coronary artery disease.  Aortic atherosclerosis.

## 2017-05-27 ENCOUNTER — Other Ambulatory Visit: Payer: Self-pay | Admitting: Cardiology

## 2017-05-30 ENCOUNTER — Other Ambulatory Visit: Payer: Self-pay | Admitting: *Deleted

## 2017-05-30 MED ORDER — AMLODIPINE BESYLATE 5 MG PO TABS: ORAL_TABLET | ORAL | 2 refills | Status: DC

## 2017-06-14 ENCOUNTER — Telehealth: Payer: Self-pay | Admitting: Cardiology

## 2017-06-14 ENCOUNTER — Ambulatory Visit (INDEPENDENT_AMBULATORY_CARE_PROVIDER_SITE_OTHER): Payer: Medicare Other | Admitting: Cardiology

## 2017-06-14 ENCOUNTER — Encounter: Payer: Self-pay | Admitting: Cardiology

## 2017-06-14 VITALS — BP 130/72 | HR 66 | Ht 67.0 in | Wt 240.0 lb

## 2017-06-14 DIAGNOSIS — I2510 Atherosclerotic heart disease of native coronary artery without angina pectoris: Secondary | ICD-10-CM

## 2017-06-14 DIAGNOSIS — I10 Essential (primary) hypertension: Secondary | ICD-10-CM | POA: Diagnosis not present

## 2017-06-14 DIAGNOSIS — R600 Localized edema: Secondary | ICD-10-CM

## 2017-06-14 DIAGNOSIS — I719 Aortic aneurysm of unspecified site, without rupture: Secondary | ICD-10-CM | POA: Diagnosis not present

## 2017-06-14 MED ORDER — FUROSEMIDE 40 MG PO TABS: 40.0000 mg | ORAL_TABLET | Freq: Every day | ORAL | 1 refills | Status: DC | PRN

## 2017-06-14 NOTE — Patient Instructions (Signed)
Your physician wants you to follow-up in: [place] will receive a reminder letter in the mail two months in advance. If you don't receive a letter, please call our office to schedule the follow-up appointment.  Your physician has recommended you make the following change in your medication:   CHANGE LASIX 40 MG DAILY AS NEEDED FOR SWELLING  Your physician recommends that you return for lab work BMP  Non-Cardiac CT Angiography (CTA), is a special type of CT scan that uses a computer to produce multi-dimensional views of major blood vessels throughout the body. In CT angiography, a contrast material is injected through an IV to help visualize the blood vessels  Thank you for choosing [facility]!!

## 2017-06-14 NOTE — Progress Notes (Signed)
Clinical Summary Mr. Schrieber is a 76 y.o.male seen today for follow up of the following medical problems.   1. Thoracic aortic aneurysm - patient had CT chest 12/2013 at [facility] for SOB, noted ascending aorta 4.4 cm aneusym - echo 01/2014 aortic root mildly dilated at 4.2 cm - CTA 01/2015 stable aneurysm at 4.4 cm.  - 07/2016 CTA stable 4.4 cm.  - denies any chest pain since last visit  2. CAD - coronary calcifications noted on CT san  - no SOB/DOE, no recent chest pain  3. Adrenal mass - incidental finding noted on recent CT,patient reports followed by pcp.   4. HTN - takes bp meds daily  5. LE edema - controlled with prn lasix    Past Medical History:  Diagnosis Date  . Bladder neck contracture   . Complication of anesthesia   . COPD (chronic obstructive pulmonary disease) ([place]) PER CXRAY  10-28-2010  . Frequency of urination   . History of prostate cancer S/P SEED IMPLANTS   . History of urethral stricture   . History of urinary retention   . Nocturia   . OSA (obstructive sleep apnea) NON-COMPLIANT   does not use cpap  . Peyronie disease   . PONV (postoperative nausea and vomiting)   . Urinary incontinence      No Known Allergies   Current Outpatient Prescriptions  Medication Sig Dispense Refill  . amLODipine (NORVASC) 5 MG tablet TAKE (1) TABLET BY MOUTH ONCE DAILY. 90 tablet 2  . aspirin EC 81 MG tablet Take 81 mg by mouth every other day.     Marland Kitchen atorvastatin (LIPITOR) 40 MG tablet TAKE (1) TABLET BY MOUTH ONCE DAILY. 90 tablet 3  . furosemide (LASIX) 20 MG tablet 1 tablet daily as needed for swelling 90 tablet 3  . ibuprofen (ADVIL,MOTRIN) 200 MG tablet Take 200 mg by mouth every 6 (six) hours as needed. For pain    . metoprolol tartrate (LOPRESSOR) 25 MG tablet Take 0.5 tablets (12.5 mg total) by mouth 2 (two) times daily. 90 tablet 3   No current facility-administered medications for this visit.      Past Surgical History:  Procedure  Laterality Date  . [place] VITRECTOMY WITH 20 GAUGE MVR PORT Right 12/11/2012   Procedure: 25 GAUGE PARS PLANA VITRECTOMY WITH 20 GAUGE MVR PORT;  Surgeon: Hayden Pedro, MD;  Location: [place];  Service: Ophthalmology;  Laterality: Right;  Injection of silicone Oil  . CATARACT EXTRACTION W/ INTRAOCULAR LENS  IMPLANT, BILATERAL    . CYSTO/ URETHRAL DILATION  04-21-2008  . CYSTO/ URETHRAL DILATION/  URETHRA BALLOON DILATION / VAPORIZATION OF PROSTATE AND URETHRAL STRICTURE  10-28-2010  . CYSTOSCOPY  01/27/2012   Procedure: CYSTOSCOPY;  Surgeon: Ailene Rud, MD;  Location: [facility];  Service: Urology;  Laterality: N/A;  cysto, cook balloon dilation of  bladder neck contracture gyrus button vaporization  carm   . EYE SURGERY     cataract removed bilaterally  . GAS INSERTION  10/23/2012   Procedure: INSERTION OF GAS;  Surgeon: Hayden Pedro, MD;  Location: [place];  Service: Ophthalmology;  Laterality: Right;  C3F8  . GAS/FLUID EXCHANGE Right 12/11/2012   Procedure: GAS/FLUID EXCHANGE;  Surgeon: Hayden Pedro, MD;  Location: [place];  Service: Ophthalmology;  Laterality: Right;  . IRIDECTOMY Right 12/11/2012   Procedure: IRIDECTOMY;  Surgeon: Hayden Pedro, MD;  Location: [place];  Service: Ophthalmology;  Laterality: Right;  .  LASER PHOTO ABLATION Right 12/11/2012   Procedure: LASER PHOTO ABLATION;  Surgeon: Hayden Pedro, MD;  Location: [place];  Service: Ophthalmology;  Laterality: Right;  . MEMBRANE PEEL Right 12/11/2012   Procedure: MEMBRANE PEEL;  Surgeon: Hayden Pedro, MD;  Location: [place];  Service: Ophthalmology;  Laterality: Right;  . [place] INJECTION Right 12/11/2012   Procedure: PERFLUORONE INJECTION;  Surgeon: Hayden Pedro, MD;  Location: [place];  Service: Ophthalmology;  Laterality: Right;  Injection and Removal  . PHOTOCOAGULATION WITH LASER  10/23/2012   Procedure: PHOTOCOAGULATION WITH LASER;  Surgeon: Hayden Pedro, MD;  Location: [place];  Service: Ophthalmology;  Laterality: Right;  . [place] INJECTION  2003  . RADIOACTIVE SEED IMPLANTS, PROSTATE  06-20-2002  . SCLERAL BUCKLE  10/23/2012   Procedure: SCLERAL BUCKLE;  Surgeon: Hayden Pedro, MD;  Location: [place];  Service: Ophthalmology;  Laterality: Right;  . TRANSURETHRAL RESECTION OF BLADDER TUMOR  05/15/2012   Procedure: TRANSURETHRAL RESECTION OF BLADDER TUMOR (TURBT);  Surgeon: Ailene Rud, MD;  Location: [facility];  Service: Urology;  Laterality: N/A;     No Known Allergies    No family history on file.   Social History Mr. [place]ar[place] reports that he quit smoking about 36 years ago. His smoking use included Cigarettes. He started smoking about 61 years ago. He has a 40.00 pack-year smoking history. He has never used smokeless tobacco. Mr. Corbello reports that he does not drink alcohol.   Review of Systems CONSTITUTIONAL: No weight loss, fever, chills, weakness or fatigue.  HEENT: Eyes: No visual loss, blurred vision, double vision or yellow sclerae.No hearing loss, sneezing, congestion, runny nose or sore throat.  SKIN: No rash or itching.  CARDIOVASCULAR: per hpi RESPIRATORY: No shortness of breath, cough or sputum.  GASTROINTESTINAL: No anorexia, nausea, vomiting or diarrhea. No abdominal pain or blood.  GENITOURINARY: No burning on urination, no polyuria NEUROLOGICAL: No headache, dizziness, syncope, paralysis, ataxia, numbness or tingling in the extremities. No change in bowel or bladder control.  MUSCULOSKELETAL: No muscle, back pain, joint pain or stiffness.  LYMPHATICS: No enlarged nodes. No history of splenectomy.  PSYCHIATRIC: No history of depression or anxiety.  ENDOCRINOLOGIC: No reports of sweating, cold or heat intolerance. No polyuria or polydipsia.  Marland Kitchen   Physical Examination Vitals:   06/14/17 1311  BP: 130/72  Pulse: 66  SpO2: 92%   Vitals:   06/14/17 1311  Weight: 240 lb (108.9 kg)  Height: 5\' 7"  (1.702 m)    Gen:  resting comfortably, no acute distress HEENT: no scleral icterus, pupils equal round and reactive, no palptable cervical adenopathy,  CV: RRR, no m/r/g, no jvd Resp: Clear to auscultation bilaterally GI: abdomen is soft, non-tender, non-distended, normal bowel sounds, no hepatosplenomegaly MSK: extremities are warm, no edema.  Skin: warm, no rash Neuro:  no focal deficits Psych: appropriate affect   Diagnostic Studies  01/2014 Echo LVEF 60-65%, possible diastolic dysfunction, mild AI, mildly dilated aorta.  01/2015 CTA chest IMPRESSION: 1. Ascending thoracic aortic aneurysm is stable. Recommend annual imaging followup by CTA or MRA. This recommendation follows 2010 ACCF/AHA/AATS/ACR/ASA/SCA/SCAI/SIR/STS/SVM Guidelines for the Diagnosis and Management of Patients with Thoracic Aortic Disease. Circulation. 2010; 121: Y301-S010. 2. Left adrenal mass, incompletely imaged, much larger than on 02/04/2004. Further evaluation with MR abdomen without and with contrast is recommended. These results will be called to the ordering clinician or representative by the Radiologist Assistant, and communication documented in the PACS or zVision Dashboard. 3. Coronary  artery calcification. 4. Cholelithiasis.  07/2016 CTA chest IMPRESSION: No acute finding.  Re- demonstration of ascending thoracic aneurysm, measuring 4.4 cm on the current study, unchanged from the prior. Recommend annual imaging followup by CTA or MRA. This recommendation follows 2010 ACCF/AHA/AATS/ACR/ASA/SCA/SCAI/SIR/STS/SVM Guidelines for the Diagnosis and Management of Patients with Thoracic Aortic Disease. Circulation. 2010; 121: G891-Q945.    Assessment and Plan   1. Thoracic aneurysm - repeat CT scan, has been stable over time.   2. HTN -bp remains at goal, continue current meds  3. LE edema -he will continue lasix as needed. Reports decreased response to 20mg  dose, we will increase to 40mg  daily prn  swelling  4. CAD - noted on CT scan, evidence of coronary calcification -asymptomatic, continue to monitor.     F/u 1 year   Arnoldo Lenis, M.D.

## 2017-06-14 NOTE — Telephone Encounter (Signed)
Pre-cert Verification for the following procedure   CT ANGIO CHEST AORTA W/CM scheduled for 06-26-17 at [facility] .

## 2017-06-23 DIAGNOSIS — I719 Aortic aneurysm of unspecified site, without rupture: Secondary | ICD-10-CM | POA: Diagnosis not present

## 2017-06-26 ENCOUNTER — Ambulatory Visit (HOSPITAL_COMMUNITY)
Admission: RE | Admit: 2017-06-26 | Discharge: 2017-06-26 | Disposition: A | Discharge status: Home / Self Care | Payer: Medicare Other | Source: Ambulatory Visit | Attending: Cardiology | Admitting: Cardiology

## 2017-06-26 DIAGNOSIS — I719 Aortic aneurysm of unspecified site, without rupture: Secondary | ICD-10-CM | POA: Diagnosis present

## 2017-06-26 DIAGNOSIS — I712 Thoracic aortic aneurysm, without rupture: Secondary | ICD-10-CM | POA: Diagnosis not present

## 2017-06-26 DIAGNOSIS — I700 Atherosclerosis of aorta: Secondary | ICD-10-CM | POA: Insufficient documentation

## 2017-06-26 DIAGNOSIS — I2510 Atherosclerotic heart disease of native coronary artery without angina pectoris: Secondary | ICD-10-CM | POA: Insufficient documentation

## 2017-06-26 MED ORDER — IOPAMIDOL (ISOVUE-370) INJECTION 76%
80.0000 mL | Freq: Once | INTRAVENOUS | Status: AC | PRN
  Administered 2017-06-26: 09:00:00 via INTRAVENOUS

## 2017-06-28 ENCOUNTER — Telehealth: Payer: Self-pay | Admitting: *Deleted

## 2017-06-28 NOTE — Telephone Encounter (Signed)
Pt wife (DPR) Jacqlyn Larsen made aware - routed to pcp

## 2017-06-28 NOTE — Telephone Encounter (Signed)
-----   Message from Arnoldo Lenis, MD sent at 06/27/2017 12:38 PM EDT ----- Stable aneurysm, we will continue to monitor  J BrancH MD

## 2017-10-19 DIAGNOSIS — H43812 Vitreous degeneration, left eye: Secondary | ICD-10-CM | POA: Diagnosis not present

## 2017-10-19 DIAGNOSIS — H31002 Unspecified chorioretinal scars, left eye: Secondary | ICD-10-CM | POA: Diagnosis not present

## 2017-10-19 DIAGNOSIS — H3521 Other non-diabetic proliferative retinopathy, right eye: Secondary | ICD-10-CM | POA: Diagnosis not present

## 2017-10-19 DIAGNOSIS — Z961 Presence of intraocular lens: Secondary | ICD-10-CM | POA: Diagnosis not present

## 2017-10-19 DIAGNOSIS — H33011 Retinal detachment with single break, right eye: Secondary | ICD-10-CM | POA: Diagnosis not present

## 2018-01-26 ENCOUNTER — Other Ambulatory Visit: Payer: Self-pay | Admitting: Cardiology

## 2018-04-10 ENCOUNTER — Telehealth: Payer: Self-pay | Admitting: Cardiology

## 2018-04-10 NOTE — Telephone Encounter (Signed)
I would plan on ordering CT at our visit, labs would depend on what his primary has recently done and would also sort out in Sept.   Zandra Abts MD

## 2018-04-10 NOTE — Telephone Encounter (Signed)
Pt aware.

## 2018-04-10 NOTE — Telephone Encounter (Signed)
Pt had labs and CTA done September 2018 - will confirm with provider if any test needed prior to upcoming September appt

## 2018-04-10 NOTE — Telephone Encounter (Signed)
Patient has upcoming  Appointment with Dr Harl Bowie in September. He is wanting to know if he  needs blood work and a study of AAA before the exam

## 2018-06-05 ENCOUNTER — Other Ambulatory Visit: Payer: Self-pay | Admitting: Cardiology

## 2018-06-27 ENCOUNTER — Ambulatory Visit: Payer: Medicare Other | Admitting: Cardiology

## 2018-07-12 ENCOUNTER — Encounter: Payer: Self-pay | Admitting: Cardiovascular Disease

## 2018-07-12 ENCOUNTER — Telehealth: Payer: Self-pay | Admitting: Cardiovascular Disease

## 2018-07-12 ENCOUNTER — Ambulatory Visit (INDEPENDENT_AMBULATORY_CARE_PROVIDER_SITE_OTHER): Payer: Medicare Other | Admitting: Cardiovascular Disease

## 2018-07-12 VITALS — BP 142/74 | HR 63 | Ht 67.0 in | Wt 232.6 lb

## 2018-07-12 DIAGNOSIS — Z01812 Encounter for preprocedural laboratory examination: Secondary | ICD-10-CM

## 2018-07-12 DIAGNOSIS — I2510 Atherosclerotic heart disease of native coronary artery without angina pectoris: Secondary | ICD-10-CM

## 2018-07-12 DIAGNOSIS — I712 Thoracic aortic aneurysm, without rupture, unspecified: Secondary | ICD-10-CM

## 2018-07-12 DIAGNOSIS — R600 Localized edema: Secondary | ICD-10-CM | POA: Diagnosis not present

## 2018-07-12 DIAGNOSIS — E7800 Pure hypercholesterolemia, unspecified: Secondary | ICD-10-CM

## 2018-07-12 DIAGNOSIS — I10 Essential (primary) hypertension: Secondary | ICD-10-CM | POA: Diagnosis not present

## 2018-07-12 NOTE — Progress Notes (Signed)
SUBJECTIVE: The patient presents to establish care with me in our [place] office.  He was previously followed by Dr. Harl Bowie.  He has a history of thoracic aortic aneurysm, coronary artery calcifications noted on CT scan, hypertension, and lower extremity edema.  CT angiography of the chest on 06/26/2017 showed an unchanged fusiform aneurysmal enlargement of the ascending aorta measuring 4.4 cm in diameter.  It was unchanged since a CT performed in 2015.  ECG performed in the office today which I ordered and personally interpreted demonstrates normal sinus rhythm with a right bundle branch block.  The patient denies any symptoms of chest pain, palpitations, shortness of breath, lightheadedness, dizziness, leg swelling, orthopnea, PND, and syncope.  He rakes and bails hay by himself.   Social history: Married for 61 years and 2019.  His wife is also my patient.  She cleans [place] Wilson's house who is also my patient.  He worked at [employer] for 47 years.  They have 4 children, 2 of whom live next to them.  Review of Systems: As per "subjective", otherwise negative.  No Known Allergies  Current Outpatient Medications  Medication Sig Dispense Refill  . amLODipine (NORVASC) 5 MG tablet TAKE (1) TABLET BY MOUTH ONCE DAILY. 90 tablet 0  . aspirin EC 81 MG tablet Take 81 mg by mouth every other day.     Marland Kitchen atorvastatin (LIPITOR) 40 MG tablet TAKE (1) TABLET BY MOUTH ONCE DAILY. 90 tablet 3  . furosemide (LASIX) 40 MG tablet Take 1 tablet (40 mg total) by mouth daily as needed. 90 tablet 1  . ibuprofen (ADVIL,MOTRIN) 200 MG tablet Take 200 mg by mouth every 6 (six) hours as needed. For pain    . metoprolol tartrate (LOPRESSOR) 25 MG tablet Take 12.5 mg by mouth 2 (two) times daily.     No current facility-administered medications for this visit.     Past Medical History:  Diagnosis Date  . Bladder neck contracture   . Complication of anesthesia   . COPD (chronic obstructive pulmonary  disease) ([place]) PER CXRAY  10-28-2010  . Frequency of urination   . History of prostate cancer S/P SEED IMPLANTS   . History of urethral stricture   . History of urinary retention   . Nocturia   . OSA (obstructive sleep apnea) NON-COMPLIANT   does not use cpap  . Peyronie disease   . PONV (postoperative nausea and vomiting)   . Urinary incontinence     Past Surgical History:  Procedure Laterality Date  . [place] VITRECTOMY WITH 20 GAUGE MVR PORT Right 12/11/2012   Procedure: 25 GAUGE PARS PLANA VITRECTOMY WITH 20 GAUGE MVR PORT;  Surgeon: Hayden Pedro, MD;  Location: [place];  Service: Ophthalmology;  Laterality: Right;  Injection of silicone Oil  . CATARACT EXTRACTION W/ INTRAOCULAR LENS  IMPLANT, BILATERAL    . CYSTO/ URETHRAL DILATION  04-21-2008  . CYSTO/ URETHRAL DILATION/  URETHRA BALLOON DILATION / VAPORIZATION OF PROSTATE AND URETHRAL STRICTURE  10-28-2010  . CYSTOSCOPY  01/27/2012   Procedure: CYSTOSCOPY;  Surgeon: Ailene Rud, MD;  Location: [facility];  Service: Urology;  Laterality: N/A;  cysto, cook balloon dilation of  bladder neck contracture gyrus button vaporization  carm   . EYE SURGERY     cataract removed bilaterally  . GAS INSERTION  10/23/2012   Procedure: INSERTION OF GAS;  Surgeon: Hayden Pedro, MD;  Location: [place];  Service: Ophthalmology;  Laterality:  Right;  C3F8  . GAS/FLUID EXCHANGE Right 12/11/2012   Procedure: GAS/FLUID EXCHANGE;  Surgeon: Hayden Pedro, MD;  Location: [place];  Service: Ophthalmology;  Laterality: Right;  . IRIDECTOMY Right 12/11/2012   Procedure: IRIDECTOMY;  Surgeon: Hayden Pedro, MD;  Location: [place];  Service: Ophthalmology;  Laterality: Right;  . LASER PHOTO ABLATION Right 12/11/2012   Procedure: LASER PHOTO ABLATION;  Surgeon: Hayden Pedro, MD;  Location: [place];  Service: Ophthalmology;  Laterality: Right;  . MEMBRANE PEEL Right 12/11/2012   Procedure: MEMBRANE PEEL;  Surgeon: Hayden Pedro, MD;  Location: [place];  Service: Ophthalmology;  Laterality: Right;  . [place] INJECTION Right 12/11/2012   Procedure: PERFLUORONE INJECTION;  Surgeon: Hayden Pedro, MD;  Location: [place];  Service: Ophthalmology;  Laterality: Right;  Injection and Removal  . PHOTOCOAGULATION WITH LASER  10/23/2012   Procedure: PHOTOCOAGULATION WITH LASER;  Surgeon: Hayden Pedro, MD;  Location: [place];  Service: Ophthalmology;  Laterality: Right;  . [place] INJECTION  2003  . RADIOACTIVE SEED IMPLANTS, PROSTATE  06-20-2002  . SCLERAL BUCKLE  10/23/2012   Procedure: SCLERAL BUCKLE;  Surgeon: Hayden Pedro, MD;  Location: [place];  Service: Ophthalmology;  Laterality: Right;  . TRANSURETHRAL RESECTION OF BLADDER TUMOR  05/15/2012   Procedure: TRANSURETHRAL RESECTION OF BLADDER TUMOR (TURBT);  Surgeon: Ailene Rud, MD;  Location: [facility];  Service: Urology;  Laterality: N/A;    Social History   Socioeconomic History  . Marital status: Married    Spouse name: Not on file  . Number of children: Not on file  . Years of education: Not on file  . Highest education level: Not on file  Occupational History  . Not on file  Social Needs  . Financial resource strain: Not on file  . Food insecurity:    Worry: Not on file    Inability: Not on file  . Transportation needs:    Medical: Not on file    Non-medical: Not on file  Tobacco Use  . Smoking status: Former Smoker    Packs/day: 2.00    Years: 20.00    Pack years: 40.00    Types: Cigarettes    Start date: 10/24/1955    Last attempt to quit: 01/24/1981    Years since quitting: 37.4  . Smokeless tobacco: Never Used  Substance and Sexual Activity  . Alcohol use: No    Alcohol/week: 0.0 standard drinks  . Drug use: No  . Sexual activity: Not on file  Lifestyle  . Physical activity:    Days per week: Not on file    Minutes per session: Not on file  . Stress: Not on file  Relationships  . Social connections:    Talks on  phone: Not on file    Gets together: Not on file    Attends religious service: Not on file    Active member of club or organization: Not on file    Attends meetings of clubs or organizations: Not on file    Relationship status: Not on file  . Intimate partner violence:    Fear of current or ex partner: Not on file    Emotionally abused: Not on file    Physically abused: Not on file    Forced sexual activity: Not on file  Other Topics Concern  . Not on file  Social History Narrative  . Not on file     Vitals:   07/12/18 1124  BP: Marland Kitchen)  142/74  Pulse: 63  Weight: 232 lb 9.6 oz (105.5 kg)  Height: 5\' 7"  (1.702 m)    Wt Readings from Last 3 Encounters:  07/12/18 232 lb 9.6 oz (105.5 kg)  06/14/17 240 lb (108.9 kg)  12/01/16 232 lb (105.2 kg)     PHYSICAL EXAM General: NAD HEENT: Normal. Neck: No JVD, no thyromegaly. Lungs: Clear to auscultation bilaterally with normal respiratory effort. CV: Regular rate and rhythm, normal S1/S2, no S3/S4, no murmur. No pretibial or periankle edema.  No carotid bruit.   Abdomen: Soft, nontender, no distention.  Neurologic: Alert and oriented.  Psych: Normal affect. Skin: Normal. Musculoskeletal: No gross deformities.    ECG: Reviewed above under Subjective   Labs: Lab Results  Component Value Date/Time   K 4.7 01/28/2014 07:52 PM   BUN 17 01/28/2014 07:52 PM   CREATININE 1.60 (H) 08/01/2016 11:13 AM   ALT 9 01/28/2014 07:52 PM   HGB 13.4 01/28/2014 07:52 PM     Lipids: No results found for: LDLCALC, LDLDIRECT, CHOL, TRIG, HDL     ASSESSMENT AND PLAN: 1.  Thoracic aortic aneurysm: Symptomatically stable.  CT angiography of the chest on 06/26/2017 showed an unchanged fusiform aneurysmal enlargement of the ascending aorta measuring 4.4 cm in diameter.  It was unchanged since a CT performed in 2015.  I will obtain a follow-up CT.  He is on metoprolol.  2.  Hypertension: Blood pressure is mildly elevated.  I will continue to  monitor.  3.  Lower extremity edema: He takes Lasix as needed.  4.  Coronary artery calcifications: Noted on CT scan.  Asymptomatic.  I will monitor symptomatology.  He is on aspirin, metoprolol and atorvastatin.  5.  Hyperlipidemia: Currently on atorvastatin 40 mg.  I will obtain a copy of lipids from PCP.   Disposition: Follow up 1 year   Kate Sable, M.D., F.A.C.C.

## 2018-07-12 NOTE — Telephone Encounter (Signed)
Pre-cert Verification for the following procedure   CT Angio Chest Aorta W & or WO Contrast scheduled for 07-27-2018 at [facility]

## 2018-07-12 NOTE — Patient Instructions (Signed)
Medication Instructions:  Continue all current medications.  Labwork: BMET - order given today.   Testing/Procedures:  CTA of aorta   Office will contact with results via phone or letter.    Follow-Up: Your physician wants you to follow up in:  1 year.  You will receive a reminder letter in the mail one-two months in advance.  If you don't receive a letter, please call our office to schedule the follow up appointment   Any Other Special Instructions Will Be Listed Below (If Applicable).  If you need a refill on your cardiac medications before your next appointment, please call your pharmacy.

## 2018-07-17 ENCOUNTER — Encounter: Payer: Self-pay | Admitting: *Deleted

## 2018-07-18 DIAGNOSIS — Z01812 Encounter for preprocedural laboratory examination: Secondary | ICD-10-CM | POA: Diagnosis not present

## 2018-07-18 DIAGNOSIS — I10 Essential (primary) hypertension: Secondary | ICD-10-CM | POA: Diagnosis not present

## 2018-07-23 ENCOUNTER — Telehealth: Payer: Self-pay | Admitting: *Deleted

## 2018-07-23 NOTE — Telephone Encounter (Signed)
Notes recorded by Laurine Blazer, LPN on 40/81/4481 at 4:58 PM EDT Wife Jacqlyn Larsen) notified. Copy to pmd. ------  Notes recorded by Herminio Commons, MD on 07/20/2018 at 12:16 PM EDT The following abnormalities are noted: Elevated creatinine consistent with chronic kidney disease stage III All other values are normal, stable or within acceptable limits. Medication changes / Follow up labs / Other changes or recommendations:  Forward lab to PCP and follow-up with PCP regarding this. Kate Sable, MD 07/20/2018 12:16 PM

## 2018-07-26 ENCOUNTER — Other Ambulatory Visit: Payer: Self-pay | Admitting: Cardiology

## 2018-07-27 ENCOUNTER — Encounter (HOSPITAL_COMMUNITY): Payer: Self-pay

## 2018-07-27 ENCOUNTER — Telehealth: Payer: Self-pay

## 2018-07-27 ENCOUNTER — Telehealth: Payer: Self-pay | Admitting: *Deleted

## 2018-07-27 ENCOUNTER — Ambulatory Visit (HOSPITAL_COMMUNITY)
Admission: RE | Admit: 2018-07-27 | Discharge: 2018-07-27 | Disposition: A | Discharge status: Home / Self Care | Payer: Medicare Other | Source: Ambulatory Visit | Attending: Cardiovascular Disease | Admitting: Cardiovascular Disease

## 2018-07-27 DIAGNOSIS — R911 Solitary pulmonary nodule: Secondary | ICD-10-CM | POA: Insufficient documentation

## 2018-07-27 DIAGNOSIS — I712 Thoracic aortic aneurysm, without rupture, unspecified: Secondary | ICD-10-CM

## 2018-07-27 DIAGNOSIS — I2510 Atherosclerotic heart disease of native coronary artery without angina pectoris: Secondary | ICD-10-CM | POA: Insufficient documentation

## 2018-07-27 DIAGNOSIS — I7789 Other specified disorders of arteries and arterioles: Secondary | ICD-10-CM | POA: Insufficient documentation

## 2018-07-27 DIAGNOSIS — I700 Atherosclerosis of aorta: Secondary | ICD-10-CM | POA: Insufficient documentation

## 2018-07-27 MED ORDER — IOPAMIDOL (ISOVUE-370) INJECTION 76%
65.0000 mL | Freq: Once | INTRAVENOUS | Status: AC | PRN
  Administered 2018-07-27: 65 mL via INTRAVENOUS

## 2018-07-27 NOTE — Telephone Encounter (Signed)
-----   Message from Herminio Commons, MD sent at 07/27/2018 11:09 AM EDT ----- This study demonstrates: Ascending thoracic aortic aneurysm has mildly increased in size from 4.4 cm in September 2018 up to 4.6 cm now.  This will need to be repeated in 6 months.  There is also a left upper lobe spiculated nodule which has grown in size and is suspicious for lung cancer. Medication changes / Follow up studies / Other recommendations:   He will require CT surgery consultation as he will need a biopsy.  Further management pending biopsy results. Please send results to the PCP:  Caryl Bis, MD  Kate Sable, MD 07/27/2018 11:07 AM

## 2018-07-27 NOTE — Telephone Encounter (Signed)
Notes recorded by Laurine Blazer, LPN on 27/03/2375 at 5:01 PM EDT Patient & daughter Pryor Curia) notified. Copy to pmd. 6 month recall put into epic for repeat CT. He agrees to CT surgery consult for lung nodule. Referral entered into epic. ------  Notes recorded by Herminio Commons, MD on 07/27/2018 at 11:09 AM EDT This study demonstrates: Ascending thoracic aortic aneurysm has mildly increased in size from 4.4 cm in September 2018 up to 4.6 cm now. This will need to be repeated in 6 months. There is also a left upper lobe spiculated nodule which has grown in size and is suspicious for lung cancer. Medication changes / Follow up studies / Other recommendations:  He will require CT surgery consultation as he will need a biopsy. Further management pending biopsy results. Please send results to the PCP: Caryl Bis, MD  Kate Sable, MD 07/27/2018 11:07 AM

## 2018-07-27 NOTE — Telephone Encounter (Signed)
Already addressed.  See result note.

## 2018-08-07 ENCOUNTER — Other Ambulatory Visit: Payer: Self-pay | Admitting: *Deleted

## 2018-08-07 ENCOUNTER — Encounter: Payer: Self-pay | Admitting: Thoracic Surgery (Cardiothoracic Vascular Surgery)

## 2018-08-07 ENCOUNTER — Institutional Professional Consult (permissible substitution) (INDEPENDENT_AMBULATORY_CARE_PROVIDER_SITE_OTHER): Payer: Medicare Other | Admitting: Thoracic Surgery (Cardiothoracic Vascular Surgery)

## 2018-08-07 ENCOUNTER — Other Ambulatory Visit: Payer: Self-pay

## 2018-08-07 VITALS — BP 144/76 | HR 67 | Resp 18 | Ht 67.0 in | Wt 228.4 lb

## 2018-08-07 DIAGNOSIS — R911 Solitary pulmonary nodule: Secondary | ICD-10-CM | POA: Diagnosis not present

## 2018-08-07 DIAGNOSIS — E278 Other specified disorders of adrenal gland: Secondary | ICD-10-CM

## 2018-08-07 DIAGNOSIS — I2510 Atherosclerotic heart disease of native coronary artery without angina pectoris: Secondary | ICD-10-CM

## 2018-08-07 DIAGNOSIS — E279 Disorder of adrenal gland, unspecified: Secondary | ICD-10-CM | POA: Diagnosis not present

## 2018-08-07 DIAGNOSIS — I272 Pulmonary hypertension, unspecified: Secondary | ICD-10-CM

## 2018-08-07 NOTE — Progress Notes (Signed)
PCP is Caryl Bis, MD Referring Provider is Herminio Commons, MD  Chief Complaint  Patient presents with  . Lung Lesion    new patient consultation, Chest CTA 07/27/18    HPI: Steve [name] is sent for consultation regarding a left upper lobe lung nodule.  Steve [name] is a 77 year old gentleman with a past history of hypertension, hyperlipidemia, obstructive sleep apnea, prostate cancer, bladder neck contracture, urethral stricture, and a detached retina.  He was found to have a 4.4 cm ascending aneurysm on a CT back in 2015.  He has been followed annually since then.  He recently saw Dr. Bronson Ing for an annual checkup.  He did a repeat CT which showed the ascending aneurysm was stable.  However, there was a 12 mm left upper lobe nodule that had been 3 mm on his previous scan.  There also was a left adrenal mass that was larger, which is felt to be a benign adenoma.  He smoked about 2 packs a day for 20 years before quitting over 40 years ago.  He has a chronic cough.  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  He is not having any chest pain, pressure, tightness, or unusual shortness of breath.  He denies wheezing.  His appetite is good and he has not had any significant weight loss.  Zubrod Score: At the time of surgery this patient's most appropriate activity status/level should be described as: [x]     0    Normal activity, no symptoms []     1    Restricted in physical strenuous activity but ambulatory, able to do out light work []     2    Ambulatory and capable of self care, unable to do work activities, up and about >50 % of waking hours                              []     3    Only limited self care, in bed greater than 50% of waking hours []     4    Completely disabled, no self care, confined to bed or chair []     5    Moribund  Past Medical History:  Diagnosis Date  . Bladder neck contracture   . Complication of anesthesia   . COPD (chronic  obstructive pulmonary disease) ([place]) PER CXRAY  10-28-2010  . Frequency of urination   . History of prostate cancer S/P SEED IMPLANTS   . History of urethral stricture   . History of urinary retention   . Nocturia   . OSA (obstructive sleep apnea) NON-COMPLIANT   does not use cpap  . Peyronie disease   . PONV (postoperative nausea and vomiting)   . Urinary incontinence     Past Surgical History:  Procedure Laterality Date  . [place] VITRECTOMY WITH 20 GAUGE MVR PORT Right 12/11/2012   Procedure: 25 GAUGE PARS PLANA VITRECTOMY WITH 20 GAUGE MVR PORT;  Surgeon: Hayden Pedro, MD;  Location: [place];  Service: Ophthalmology;  Laterality: Right;  Injection of silicone Oil  . CATARACT EXTRACTION W/ INTRAOCULAR LENS  IMPLANT, BILATERAL    . CYSTO/ URETHRAL DILATION  04-21-2008  . CYSTO/ URETHRAL DILATION/  URETHRA BALLOON DILATION / VAPORIZATION OF PROSTATE AND URETHRAL STRICTURE  10-28-2010  . CYSTOSCOPY  01/27/2012   Procedure: CYSTOSCOPY;  Surgeon: Ailene Rud, MD;  Location: [facility];  Service: Urology;  Laterality: N/A;  cysto, cook balloon dilation of  bladder neck contracture gyrus button vaporization  carm   . EYE SURGERY     cataract removed bilaterally  . GAS INSERTION  10/23/2012   Procedure: INSERTION OF GAS;  Surgeon: Hayden Pedro, MD;  Location: [place];  Service: Ophthalmology;  Laterality: Right;  C3F8  . GAS/FLUID EXCHANGE Right 12/11/2012   Procedure: GAS/FLUID EXCHANGE;  Surgeon: Hayden Pedro, MD;  Location: [place];  Service: Ophthalmology;  Laterality: Right;  . IRIDECTOMY Right 12/11/2012   Procedure: IRIDECTOMY;  Surgeon: Hayden Pedro, MD;  Location: [place];  Service: Ophthalmology;  Laterality: Right;  . LASER PHOTO ABLATION Right 12/11/2012   Procedure: LASER PHOTO ABLATION;  Surgeon: Hayden Pedro, MD;  Location: [place];  Service: Ophthalmology;  Laterality: Right;  . MEMBRANE PEEL Right 12/11/2012   Procedure: MEMBRANE PEEL;   Surgeon: Hayden Pedro, MD;  Location: [place];  Service: Ophthalmology;  Laterality: Right;  . [place] INJECTION Right 12/11/2012   Procedure: PERFLUORONE INJECTION;  Surgeon: Hayden Pedro, MD;  Location: [place];  Service: Ophthalmology;  Laterality: Right;  Injection and Removal  . PHOTOCOAGULATION WITH LASER  10/23/2012   Procedure: PHOTOCOAGULATION WITH LASER;  Surgeon: Hayden Pedro, MD;  Location: [place];  Service: Ophthalmology;  Laterality: Right;  . [place] INJECTION  2003  . RADIOACTIVE SEED IMPLANTS, PROSTATE  06-20-2002  . SCLERAL BUCKLE  10/23/2012   Procedure: SCLERAL BUCKLE;  Surgeon: Hayden Pedro, MD;  Location: [place];  Service: Ophthalmology;  Laterality: Right;  . TRANSURETHRAL RESECTION OF BLADDER TUMOR  05/15/2012   Procedure: TRANSURETHRAL RESECTION OF BLADDER TUMOR (TURBT);  Surgeon: Ailene Rud, MD;  Location: [facility];  Service: Urology;  Laterality: N/A;    History reviewed. No pertinent family history.  Social History Social History   Tobacco Use  . Smoking status: Former Smoker    Packs/day: 2.00    Years: 20.00    Pack years: 40.00    Types: Cigarettes    Start date: 10/24/1955    Last attempt to quit: 01/24/1981    Years since quitting: 37.5  . Smokeless tobacco: Never Used  Substance Use Topics  . Alcohol use: No    Alcohol/week: 0.0 standard drinks  . Drug use: No    Current Outpatient Medications  Medication Sig Dispense Refill  . amLODipine (NORVASC) 5 MG tablet TAKE (1) TABLET BY MOUTH ONCE DAILY. 90 tablet 0  . aspirin EC 81 MG tablet Take 81 mg by mouth every other day.     Marland Kitchen atorvastatin (LIPITOR) 40 MG tablet TAKE (1) TABLET BY MOUTH ONCE DAILY. 90 tablet 1  . ibuprofen (ADVIL,MOTRIN) 200 MG tablet Take 200 mg by mouth every 6 (six) hours as needed. For pain    . metoprolol tartrate (LOPRESSOR) 25 MG tablet Take 12.5 mg by mouth 2 (two) times daily.    . furosemide (LASIX) 40 MG tablet Take 1 tablet (40 mg total) by  mouth daily as needed. 90 tablet 1   No current facility-administered medications for this visit.     No Known Allergies  Review of Systems  Constitutional: Negative for unexpected weight change.  HENT: Positive for dental problem (Dentures). Negative for trouble swallowing and voice change.   Eyes: Negative for visual disturbance (History of retinal detachment).  Respiratory: Positive for cough. Negative for shortness of breath and wheezing.   Cardiovascular: Negative for chest pain and  leg swelling.  Gastrointestinal: Negative for abdominal distention and abdominal pain.  Genitourinary: Negative for difficulty urinating and dysuria.       History of prostate cancer, bladder neck contracture, urethral stricture  Musculoskeletal: Positive for arthralgias, gait problem (Due to ankle injury) and joint swelling.       Leg cramps  Neurological: Negative for seizures, syncope and weakness.  Hematological: Negative for adenopathy. Bruises/bleeds easily.    BP (!) 144/76 (BP Location: Left Arm, Patient Position: Sitting, Cuff Size: Normal)   Pulse 67   Resp 18   Ht 5\' 7"  (1.702 m)   Wt 228 lb 6.4 oz (103.6 kg)   SpO2 94% Comment: RA  BMI 35.77 kg/m  Physical Exam  Constitutional: He is oriented to person, place, and time. He appears well-developed and well-nourished. No distress.  HENT:  Head: Normocephalic and atraumatic.  Mouth/Throat: No oropharyngeal exudate.  Eyes: Conjunctivae and EOM are normal. No scleral icterus.  Neck: Neck supple. No thyromegaly present.  Cardiovascular: Normal rate, regular rhythm and normal heart sounds. Exam reveals no gallop and no friction rub.  No murmur heard. Pulmonary/Chest: Effort normal and breath sounds normal. No respiratory distress. He has no wheezes. He has no rales.  Abdominal: Soft. He exhibits no distension. There is no tenderness.  Musculoskeletal: He exhibits deformity (Left ankle). He exhibits no edema.  Lymphadenopathy:    He has  no cervical adenopathy.  Neurological: He is alert and oriented to person, place, and time. No cranial nerve deficit. He exhibits normal muscle tone. Coordination normal.  Skin: Skin is warm and dry.  Vitals reviewed.   Diagnostic Tests: CT ANGIOGRAPHY CHEST WITH CONTRAST  TECHNIQUE: Multidetector CT imaging of the chest was performed using the standard protocol during bolus administration of intravenous contrast. Multiplanar CT image reconstructions and MIPs were obtained to evaluate the vascular anatomy.  CONTRAST:  79mL ISOVUE-370 IOPAMIDOL (ISOVUE-370) INJECTION 76%  COMPARISON:  06/26/2017 and previous  FINDINGS: Cardiovascular: Heart size normal. No pericardial effusion. Enlarged central pulmonary arteries. Limited opacification of peripheral pulmonary arterial branches; the exam was not optimized for detection of pulmonary emboli. Moderate coronary calcifications. Good contrast opacification of the thoracic aorta. Transverse dimensions as follows:  3.9 cm sinuses of Valsalva  3.7 cm sino-tubular junction  4.6 cm proximal ascending (previously 4.4)  4 cm distal ascending/proximal arch  3 cm distal arch  3.5 cm proximal descending  3 cm distal descending  No dissection or stenosis. Classic 3 vessel brachiocephalic arterial origin anatomy without proximal stenosis. Scattered calcified plaque in the arch and descending thoracic segment. Visualized proximal abdominal aorta similarly shows mild atheromatous change.  Mediastinum/Nodes: No hilar or mediastinal adenopathy.  Lungs/Pleura: No pleural effusion.  No pneumothorax.  1.4 cm pleural-based macrolobulated and spiculated nodule, left upper lobe image 31/7, previously 3 mm. Partially calcified granuloma in the medial right lower lobe. Lungs otherwise clear.  Upper Abdomen: 4.3 cm left adrenal mass, incompletely visualized, previously characterized as adenoma on MR 01/08/2015 at which time it  measured 3.4 cm. No acute findings.  Musculoskeletal: Old healed right rib fractures. Anterior vertebral endplate spurring at multiple levels in the mid and lower thoracic spine. No acute fracture or worrisome bone lesion.  Review of the MIP images confirms the above findings.  IMPRESSION: 1. 4.6 cm ascending thoracic aortic aneurysm. Recommend semi-annual imaging followup by CTA or MRA and referral to cardiothoracic surgery if not already obtained. This recommendation follows 2010 ACCF/AHA/AATS/ACR/ASA/SCA/SCAI/SIR/STS/SVM Guidelines for the Diagnosis and Management of Patients With Thoracic  Aortic Disease. Circulation. 2010; 121: [id number] 2. 14 mm left upper lobe peripheral spiculated nodule, previously 3 mm, suspicious for primary bronchogenic carcinoma. Consultation with pulmonary medicine or thoracic surgery is suggested, as clinically appropriate.  These results will be called to the ordering clinician or representative by the Radiologist Assistant, and communication documented in the PACS or zVision Dashboard.  3. Enlarged central pulmonary arteries suggesting pulmonary arterial hypertension. 4. Coronary and Aortic Atherosclerosis (ICD10-170.0).   Electronically Signed   By: Lucrezia Europe M.D.   On: 07/27/2018 08:22 I personally reviewed the CT images and concur with the findings noted above.  Impression: Mr. Grell is a 77 year old man with a remote history of tobacco abuse who is being followed for an ascending aneurysm.  A recent CT showed an increased size of a subpleural left upper lobe nodule from 3 to 12 mm over the course of the year.  There was no hilar or mediastinal adenopathy.  There were no other suspicious lung nodules.  He does have a left adrenal mass that has been present for many years although that is also larger than it had been previously.  I reviewed the CT images with Mr. Litzinger and his daughter-in-law.  We discussed the differential diagnosis.   They understand this most likely is a new primary bronchogenic carcinoma.  Infectious and inflammatory nodules are also within the differential.  I recommended that we obtain a PET/CT to help guide our initial diagnostic work-up and also pulmonary function testing to assess his pulmonary reserve.  Based on his history I think he would have adequate pulmonary function to tolerate a resection should he so choose.  We also briefly discussed potential treatment options, which in this case are surgery or radiation.  Despite his advanced age he is in good physical condition and I think he would tolerate resection well.  Should he choose to opt for radiation he would probably need a biopsy before being treated.  Thoracic aortic atherosclerosis/ascending aneurysm-stable on recent CT even though measured slightly larger at 4.6 cm, to my evaluation it is unchanged..  Needs continued follow-up.  With next be due for a CT in 6 months.  Coronary atherosclerosis-noted on CT.  No anginal symptoms.  Possible pulmonary hypertension-main PA is enlarged on his scan.  He does not have any signs or symptoms of pulmonary hypertension.    Plan: PET/CT PFTs Return in 2 weeks to discuss results  Melrose Nakayama, MD [facility] [phone]

## 2018-08-07 NOTE — Progress Notes (Unsigned)
pet

## 2018-08-14 ENCOUNTER — Ambulatory Visit (HOSPITAL_COMMUNITY)
Admission: RE | Admit: 2018-08-14 | Discharge: 2018-08-14 | Disposition: A | Discharge status: Home / Self Care | Payer: Medicare Other | Source: Ambulatory Visit | Attending: Cardiovascular Disease | Admitting: Cardiovascular Disease

## 2018-08-14 DIAGNOSIS — I272 Pulmonary hypertension, unspecified: Secondary | ICD-10-CM | POA: Diagnosis not present

## 2018-08-14 NOTE — Progress Notes (Signed)
*  PRELIMINARY RESULTS* Echocardiogram 2D Echocardiogram has been performed.  Samuel Germany 08/14/2018, 9:55 AM

## 2018-08-15 ENCOUNTER — Ambulatory Visit (HOSPITAL_COMMUNITY)
Admission: RE | Admit: 2018-08-15 | Discharge: 2018-08-15 | Disposition: A | Discharge status: Home / Self Care | Payer: Medicare Other | Source: Ambulatory Visit | Attending: Thoracic Surgery (Cardiothoracic Vascular Surgery) | Admitting: Thoracic Surgery (Cardiothoracic Vascular Surgery)

## 2018-08-15 DIAGNOSIS — R911 Solitary pulmonary nodule: Secondary | ICD-10-CM | POA: Diagnosis not present

## 2018-08-15 LAB — PULMONARY FUNCTION TEST
DL/VA % PRED: 75 %
DL/VA: 3.32 ml/min/mmHg/L
DLCO UNC: 14.84 ml/min/mmHg
DLCO unc % pred: 52 %
FEF 25-75 POST: 0.94 L/s
FEF 25-75 Pre: 1.07 L/sec
FEF2575-%Change-Post: -12 %
FEF2575-%PRED-POST: 53 %
FEF2575-%Pred-Pre: 61 %
FEV1-%CHANGE-POST: -2 %
FEV1-%PRED-PRE: 75 %
FEV1-%Pred-Post: 73 %
FEV1-Post: 1.87 L
FEV1-Pre: 1.9 L
FEV1FVC-%CHANGE-POST: -1 %
FEV1FVC-%PRED-PRE: 94 %
FEV6-%Change-Post: -2 %
FEV6-%Pred-Post: 80 %
FEV6-%Pred-Pre: 82 %
FEV6-PRE: 2.73 L
FEV6-Post: 2.67 L
FEV6FVC-%CHANGE-POST: -1 %
FEV6FVC-%PRED-PRE: 104 %
FEV6FVC-%Pred-Post: 102 %
FVC-%CHANGE-POST: 0 %
FVC-%PRED-PRE: 78 %
FVC-%Pred-Post: 78 %
FVC-POST: 2.8 L
FVC-Pre: 2.8 L
POST FEV1/FVC RATIO: 67 %
PRE FEV6/FVC RATIO: 97 %
Post FEV6/FVC ratio: 96 %
Pre FEV1/FVC ratio: 68 %
RV % PRED: 144 %
RV: 3.57 L
TLC % pred: 96 %
TLC: 6.2 L

## 2018-08-15 MED ORDER — ALBUTEROL SULFATE (2.5 MG/3ML) 0.083% IN NEBU
2.5000 mg | INHALATION_SOLUTION | Freq: Once | RESPIRATORY_TRACT | Status: AC
  Administered 2018-08-15: 2.5 mg via RESPIRATORY_TRACT

## 2018-08-16 ENCOUNTER — Ambulatory Visit (HOSPITAL_COMMUNITY)
Admission: RE | Admit: 2018-08-16 | Discharge: 2018-08-16 | Disposition: A | Discharge status: Home / Self Care | Payer: Medicare Other | Source: Ambulatory Visit | Attending: Thoracic Surgery (Cardiothoracic Vascular Surgery) | Admitting: Thoracic Surgery (Cardiothoracic Vascular Surgery)

## 2018-08-16 DIAGNOSIS — R911 Solitary pulmonary nodule: Secondary | ICD-10-CM | POA: Insufficient documentation

## 2018-08-16 LAB — GLUCOSE, CAPILLARY: Glucose-Capillary: 98 mg/dL (ref 70–99)

## 2018-08-16 MED ORDER — FLUDEOXYGLUCOSE F - 18 (FDG) INJECTION
11.4300 | Freq: Once | INTRAVENOUS | Status: AC | PRN
  Administered 2018-08-16: 11.43 via INTRAVENOUS

## 2018-08-20 ENCOUNTER — Ambulatory Visit (INDEPENDENT_AMBULATORY_CARE_PROVIDER_SITE_OTHER): Payer: Medicare Other | Admitting: Thoracic Surgery (Cardiothoracic Vascular Surgery)

## 2018-08-20 VITALS — BP 147/76 | HR 63 | Resp 20 | Ht 67.0 in | Wt 227.0 lb

## 2018-08-20 DIAGNOSIS — I2510 Atherosclerotic heart disease of native coronary artery without angina pectoris: Secondary | ICD-10-CM | POA: Diagnosis not present

## 2018-08-20 DIAGNOSIS — R911 Solitary pulmonary nodule: Secondary | ICD-10-CM | POA: Diagnosis not present

## 2018-08-20 NOTE — Progress Notes (Signed)
[address]       [place] [address]             [phone]       HPI: Mr. Shetley returns to discuss the results of his PET CT and pulmonary function testing  aquiles ruffini is a 77 year old man with a past history of hypertension, hyperlipidemia, prostate cancer, bladder neck contracture, urethral stricture, obstructive sleep apnea, and a detached retina.  He also has a 4.4 cm ascending aneurysm.  That was first noted in 2015.  He has been followed with annual CTs since then.  He recently had a CT which showed the aneurysm was stable however there was a 12 mm left upper lobe nodule that was 3 mm on his previous scan.  He had a brief history of smoking 2 packs/day for 20 years before quitting 40 years ago.   I saw him in the office 2 weeks ago.  I recommended a PET CT and pulmonary function testing.  He is very active and does appear to be a surgical candidate.  Zubrod Score: At the time of surgery this patient's most appropriate activity status/level should be described as: []     0    Normal activity, no symptoms []     1    Restricted in physical strenuous activity but ambulatory, able to do out light work []     2    Ambulatory and capable of self care, unable to do work activities, up and about >50 % of waking hours                              []     3    Only limited self care, in bed greater than 50% of waking hours []     4    Completely disabled, no self care, confined to bed or chair []     5    Moribund Past Medical History:  Diagnosis Date  . Bladder neck contracture   . Complication of anesthesia   . COPD (chronic obstructive pulmonary disease) ([place]) PER CXRAY  10-28-2010  . Frequency of urination   . History of prostate cancer S/P SEED IMPLANTS   . History of urethral stricture   . History of urinary retention   . Nocturia   . OSA (obstructive sleep apnea) NON-COMPLIANT   does not use cpap  . Peyronie disease   . PONV (postoperative nausea and  vomiting)   . Urinary incontinence    Past Surgical History:  Procedure Laterality Date  . [place] VITRECTOMY WITH 20 GAUGE MVR PORT Right 12/11/2012   Procedure: 25 GAUGE PARS PLANA VITRECTOMY WITH 20 GAUGE MVR PORT;  Surgeon: Hayden Pedro, MD;  Location: [place];  Service: Ophthalmology;  Laterality: Right;  Injection of silicone Oil  . CATARACT EXTRACTION W/ INTRAOCULAR LENS  IMPLANT, BILATERAL    . CYSTO/ URETHRAL DILATION  04-21-2008  . CYSTO/ URETHRAL DILATION/  URETHRA BALLOON DILATION / VAPORIZATION OF PROSTATE AND URETHRAL STRICTURE  10-28-2010  . CYSTOSCOPY  01/27/2012   Procedure: CYSTOSCOPY;  Surgeon: Ailene Rud, MD;  Location: [facility];  Service: Urology;  Laterality: N/A;  cysto, cook balloon dilation of  bladder neck contracture gyrus button vaporization  carm   . EYE SURGERY     cataract removed bilaterally  . GAS INSERTION  10/23/2012   Procedure: INSERTION OF GAS;  Surgeon:  Hayden Pedro, MD;  Location: [place];  Service: Ophthalmology;  Laterality: Right;  C3F8  . GAS/FLUID EXCHANGE Right 12/11/2012   Procedure: GAS/FLUID EXCHANGE;  Surgeon: Hayden Pedro, MD;  Location: [place];  Service: Ophthalmology;  Laterality: Right;  . IRIDECTOMY Right 12/11/2012   Procedure: IRIDECTOMY;  Surgeon: Hayden Pedro, MD;  Location: [place];  Service: Ophthalmology;  Laterality: Right;  . LASER PHOTO ABLATION Right 12/11/2012   Procedure: LASER PHOTO ABLATION;  Surgeon: Hayden Pedro, MD;  Location: [place];  Service: Ophthalmology;  Laterality: Right;  . MEMBRANE PEEL Right 12/11/2012   Procedure: MEMBRANE PEEL;  Surgeon: Hayden Pedro, MD;  Location: [place];  Service: Ophthalmology;  Laterality: Right;  . [place] INJECTION Right 12/11/2012   Procedure: PERFLUORONE INJECTION;  Surgeon: Hayden Pedro, MD;  Location: [place];  Service: Ophthalmology;  Laterality: Right;  Injection and Removal  . PHOTOCOAGULATION WITH LASER  10/23/2012   Procedure:  PHOTOCOAGULATION WITH LASER;  Surgeon: Hayden Pedro, MD;  Location: [place];  Service: Ophthalmology;  Laterality: Right;  . [place] INJECTION  2003  . RADIOACTIVE SEED IMPLANTS, PROSTATE  06-20-2002  . SCLERAL BUCKLE  10/23/2012   Procedure: SCLERAL BUCKLE;  Surgeon: Hayden Pedro, MD;  Location: [place];  Service: Ophthalmology;  Laterality: Right;  . TRANSURETHRAL RESECTION OF BLADDER TUMOR  05/15/2012   Procedure: TRANSURETHRAL RESECTION OF BLADDER TUMOR (TURBT);  Surgeon: Ailene Rud, MD;  Location: [facility];  Service: Urology;  Laterality: N/A;    Current Outpatient Medications  Medication Sig Dispense Refill  . amLODipine (NORVASC) 5 MG tablet TAKE (1) TABLET BY MOUTH ONCE DAILY. 90 tablet 0  . aspirin EC 81 MG tablet Take 81 mg by mouth every other day.     Marland Kitchen atorvastatin (LIPITOR) 40 MG tablet TAKE (1) TABLET BY MOUTH ONCE DAILY. 90 tablet 1  . furosemide (LASIX) 40 MG tablet Take 1 tablet (40 mg total) by mouth daily as needed. 90 tablet 1  . ibuprofen (ADVIL,MOTRIN) 200 MG tablet Take 200 mg by mouth every 6 (six) hours as needed. For pain    . metoprolol tartrate (LOPRESSOR) 25 MG tablet Take 12.5 mg by mouth 2 (two) times daily.     No current facility-administered medications for this visit.   Review of systems See note from 08/07/2018 for full details Positive for cough, arthralgia, gait difficulty due to ankle injury, and joint swelling.  Also positive for bruises easily.  Negative for chest pain, pressure, tightness, leg swelling shortness of breath and wheezing.  Physical Exam BP (!) 147/76   Pulse 63   Resp 20   Ht 5\' 7"  (1.702 m)   Wt 227 lb (103 kg)   SpO2 94% Comment: RA  BMI 35.55 kg/m  Obese 77 year old man in no acute distress Alert and oriented x3 with no focal deficits No cervical or subclavicular adenopathy Lungs clear with equal breath sounds bilaterally Cardiac regular rate and rhythm normal S1 and S2 no rubs or murmurs Abdomen soft  nontender Extremities without clubbing cyanosis or edema  Diagnostic Tests: NUCLEAR MEDICINE PET SKULL BASE TO THIGH  TECHNIQUE: 11.4 mCi F-18 FDG was injected intravenously. Full-ring PET imaging was performed from the skull base to thigh after the radiotracer. CT data was obtained and used for attenuation correction and anatomic localization.  Fasting blood glucose: 98 mg/dl  COMPARISON:  Chest CT 07/27/2018  FINDINGS: Mediastinal blood pool activity: SUV max 3.46  NECK: No hypermetabolic lymph nodes  in the neck.  Incidental CT findings: none  CHEST: No axillary, supraclavicular, mediastinal, or hilar hypermetabolic lymph nodes. The pulmonary nodule within the left upper lobe measures 1.3 cm and has an SUV max of 9.49. Calcified granuloma identified within the posteromedial right lower lobe. No hypermetabolic mediastinal or hilar lymph nodes.  Incidental CT findings: Moderate changes of emphysema. Aortic atherosclerosis. Calcification within the left main and LAD coronary arteries noted.  ABDOMEN/PELVIS: No abnormal hypermetabolic activity within the liver, pancreas, adrenal glands, or spleen. No hypermetabolic lymph nodes in the abdomen or pelvis. There is a focal area of intense uptake localizing to the right lower quadrant of the abdomen within the cecum. SUV max is equal to 20.66.  Incidental CT findings: Seed implants identified within the prostate gland.9 mm gallstone identified within the gallbladder fundus. There is a 4.2 cm mass arising from the left adrenal gland containing foci of macroscopic fat compatible with a benign myelolipoma, image 121/4  SKELETON: No focal hypermetabolic activity to suggest skeletal metastasis.  Incidental CT findings: none  IMPRESSION: 1. The left upper lobe pulmonary nodule exhibits intense radiotracer uptake compatible with primary bronchogenic carcinoma. Assuming a non-small cell histology imaging findings are  compatible with a T1bN0M0 lesion. 2. Focal area of increased radiotracer uptake localizing to the cecum with SUV max equal to 20.66. Underlying colonic neoplasm cannot be excluded and correlation with colonoscopy advised. 3. Aortic Atherosclerosis (ICD10-I70.0) and Emphysema (ICD10-J43.9). 4. Gallstone 5. Left adrenal gland myelolipoma.   Electronically Signed   By: Kerby Moors M.D.   On: 08/16/2018 13:08 I personally reviewed the PET/CT images and concur with the findings noted above  Pulmonary function testing FVC 2.80 (78%) FEV1 1.90 (75%), no change with bronchodilator TLC 6.20 (96%) RV 3.57 (144%) DLCO 14.84 (52%)  Impression: Mr. Huge is a 77 year old gentleman with a history of hypertension, hyperlipidemia, struct of sleep apnea, prostate cancer, urethral stricture, and an ascending aortic aneurysm.  He recently had a CT of the chest to follow-up the ascending aneurysm.  That was stable but he was found to have a 1.2 cm left upper lobe lung nodule.  In retrospect there is been a 3 mm nodule in that area a year prior.  On PET CT that nodule is hypermetabolic with an SUV of 3.24.  Findings are consistent with a new primary bronchogenic carcinoma.  Metastatic disease, infectious, and inflammatory nodules are also in the differential diagnosis.  My opinion this needs to be treated as a primary lung cancer unless we can prove otherwise.  I discussed with Mr. Iodice and his daughter the potential treatment of the lesion with surgery versus radiation.  We discussed the relative advantages and disadvantages of each of those approaches.  He does have adequate pulmonary reserve to tolerate resection and that gives him the best chance of a cure.  I described the proposed operation to Mr. Strausser and his daughter.  We will plan to do left VATS for wedge resection and possible lingular sparing left upper lobectomy depending on the results of the intraoperative frozen section.  They  understand the general nature of the procedure, the need for general anesthesia, the incisions to be used, the use of a drainage tube postoperatively, the expected hospital stay, and the overall recovery.  I informed him of the indications, risks, benefits, and alternatives.  They understand the risks include, but not limited to death, MI, DVT, PE, bleeding, possible need for transfusion, infection, cardiac arrhythmias, prolonged air leaks, as well as possibility  of other unforeseeable complications.  He was initially reluctant to proceed but his daughter convinced him that he should proceed with surgery.  We are tentatively planning for Monday, 09/10/2018.  PET/CT also showed hypermetabolic activity in the area of the cecum.  This is very concerning for a colon cancer.  He says that he has never had a colonoscopy.  I do think that needs to be investigated.  He would prefer to go through Dr. Quillian Quince for referral to a gastroenterologist.  I will notify Dr. Quillian Quince, but I also advised the patient to check in with him as well.  Plan: Referral to gastroenterology for colonoscopy Left VATS for wedge resection and possible lingular sparing upper lobectomy on Monday, 09/10/2018  Melrose Nakayama, MD [facility] [phone]

## 2018-08-21 ENCOUNTER — Other Ambulatory Visit: Payer: Self-pay

## 2018-08-21 ENCOUNTER — Other Ambulatory Visit: Payer: Self-pay | Admitting: *Deleted

## 2018-08-21 ENCOUNTER — Encounter: Payer: Self-pay | Admitting: *Deleted

## 2018-08-21 DIAGNOSIS — K6389 Other specified diseases of intestine: Secondary | ICD-10-CM | POA: Diagnosis not present

## 2018-08-21 DIAGNOSIS — R911 Solitary pulmonary nodule: Secondary | ICD-10-CM

## 2018-08-27 DIAGNOSIS — D120 Benign neoplasm of cecum: Secondary | ICD-10-CM | POA: Diagnosis not present

## 2018-08-27 DIAGNOSIS — R935 Abnormal findings on diagnostic imaging of other abdominal regions, including retroperitoneum: Secondary | ICD-10-CM | POA: Diagnosis not present

## 2018-08-27 DIAGNOSIS — Z8546 Personal history of malignant neoplasm of prostate: Secondary | ICD-10-CM | POA: Diagnosis not present

## 2018-08-27 DIAGNOSIS — I10 Essential (primary) hypertension: Secondary | ICD-10-CM | POA: Diagnosis not present

## 2018-08-27 DIAGNOSIS — G4733 Obstructive sleep apnea (adult) (pediatric): Secondary | ICD-10-CM | POA: Diagnosis not present

## 2018-08-27 DIAGNOSIS — Z7982 Long term (current) use of aspirin: Secondary | ICD-10-CM | POA: Diagnosis not present

## 2018-08-27 DIAGNOSIS — R948 Abnormal results of function studies of other organs and systems: Secondary | ICD-10-CM | POA: Diagnosis not present

## 2018-08-27 DIAGNOSIS — Z79899 Other long term (current) drug therapy: Secondary | ICD-10-CM | POA: Diagnosis not present

## 2018-08-27 DIAGNOSIS — C3490 Malignant neoplasm of unspecified part of unspecified bronchus or lung: Secondary | ICD-10-CM | POA: Diagnosis not present

## 2018-08-27 DIAGNOSIS — E7800 Pure hypercholesterolemia, unspecified: Secondary | ICD-10-CM | POA: Diagnosis not present

## 2018-08-27 DIAGNOSIS — R1909 Other intra-abdominal and pelvic swelling, mass and lump: Secondary | ICD-10-CM | POA: Diagnosis not present

## 2018-09-04 DIAGNOSIS — K6389 Other specified diseases of intestine: Secondary | ICD-10-CM | POA: Diagnosis not present

## 2018-09-05 ENCOUNTER — Other Ambulatory Visit: Payer: Self-pay | Admitting: *Deleted

## 2018-09-05 ENCOUNTER — Encounter: Payer: Self-pay | Admitting: Surgery

## 2018-09-06 ENCOUNTER — Inpatient Hospital Stay (HOSPITAL_COMMUNITY): Admission: RE | Admit: 2018-09-06 | Payer: Medicare Other | Source: Ambulatory Visit

## 2018-09-10 ENCOUNTER — Encounter (HOSPITAL_COMMUNITY): Admission: RE | Payer: Self-pay | Source: Ambulatory Visit

## 2018-09-10 ENCOUNTER — Inpatient Hospital Stay (HOSPITAL_COMMUNITY)
Admission: RE | Admit: 2018-09-10 | Payer: Medicare Other | Source: Ambulatory Visit | Admitting: Thoracic Surgery (Cardiothoracic Vascular Surgery)

## 2018-09-10 DIAGNOSIS — K6389 Other specified diseases of intestine: Secondary | ICD-10-CM | POA: Diagnosis not present

## 2018-09-10 SURGERY — VIDEO ASSISTED THORACOSCOPY (VATS)/WEDGE RESECTION
Anesthesia: General | Site: Chest | Laterality: Left

## 2018-09-11 DIAGNOSIS — K6389 Other specified diseases of intestine: Secondary | ICD-10-CM | POA: Diagnosis not present

## 2018-09-11 DIAGNOSIS — Z01810 Encounter for preprocedural cardiovascular examination: Secondary | ICD-10-CM | POA: Diagnosis not present

## 2018-09-11 DIAGNOSIS — Z01812 Encounter for preprocedural laboratory examination: Secondary | ICD-10-CM | POA: Diagnosis not present

## 2018-09-18 DIAGNOSIS — I5033 Acute on chronic diastolic (congestive) heart failure: Secondary | ICD-10-CM | POA: Diagnosis not present

## 2018-09-18 DIAGNOSIS — K8020 Calculus of gallbladder without cholecystitis without obstruction: Secondary | ICD-10-CM | POA: Diagnosis not present

## 2018-09-18 DIAGNOSIS — C180 Malignant neoplasm of cecum: Secondary | ICD-10-CM | POA: Diagnosis present

## 2018-09-18 DIAGNOSIS — I712 Thoracic aortic aneurysm, without rupture: Secondary | ICD-10-CM | POA: Diagnosis present

## 2018-09-18 DIAGNOSIS — I2510 Atherosclerotic heart disease of native coronary artery without angina pectoris: Secondary | ICD-10-CM | POA: Diagnosis present

## 2018-09-18 DIAGNOSIS — K9189 Other postprocedural complications and disorders of digestive system: Secondary | ICD-10-CM | POA: Diagnosis not present

## 2018-09-18 DIAGNOSIS — Z79899 Other long term (current) drug therapy: Secondary | ICD-10-CM | POA: Diagnosis not present

## 2018-09-18 DIAGNOSIS — C182 Malignant neoplasm of ascending colon: Secondary | ICD-10-CM | POA: Diagnosis not present

## 2018-09-18 DIAGNOSIS — Z87891 Personal history of nicotine dependence: Secondary | ICD-10-CM | POA: Diagnosis not present

## 2018-09-18 DIAGNOSIS — E43 Unspecified severe protein-calorie malnutrition: Secondary | ICD-10-CM | POA: Diagnosis present

## 2018-09-18 DIAGNOSIS — R140 Abdominal distension (gaseous): Secondary | ICD-10-CM | POA: Diagnosis not present

## 2018-09-18 DIAGNOSIS — Z801 Family history of malignant neoplasm of trachea, bronchus and lung: Secondary | ICD-10-CM | POA: Diagnosis not present

## 2018-09-18 DIAGNOSIS — Z808 Family history of malignant neoplasm of other organs or systems: Secondary | ICD-10-CM | POA: Diagnosis not present

## 2018-09-18 DIAGNOSIS — R739 Hyperglycemia, unspecified: Secondary | ICD-10-CM | POA: Diagnosis present

## 2018-09-18 DIAGNOSIS — R5082 Postprocedural fever: Secondary | ICD-10-CM | POA: Diagnosis not present

## 2018-09-18 DIAGNOSIS — E871 Hypo-osmolality and hyponatremia: Secondary | ICD-10-CM | POA: Diagnosis not present

## 2018-09-18 DIAGNOSIS — I110 Hypertensive heart disease with heart failure: Secondary | ICD-10-CM | POA: Diagnosis present

## 2018-09-18 DIAGNOSIS — Z9119 Patient's noncompliance with other medical treatment and regimen: Secondary | ICD-10-CM | POA: Diagnosis not present

## 2018-09-18 DIAGNOSIS — K6289 Other specified diseases of anus and rectum: Secondary | ICD-10-CM | POA: Diagnosis not present

## 2018-09-18 DIAGNOSIS — C3412 Malignant neoplasm of upper lobe, left bronchus or lung: Secondary | ICD-10-CM | POA: Diagnosis present

## 2018-09-18 DIAGNOSIS — I4510 Unspecified right bundle-branch block: Secondary | ICD-10-CM | POA: Diagnosis present

## 2018-09-18 DIAGNOSIS — N179 Acute kidney failure, unspecified: Secondary | ICD-10-CM | POA: Diagnosis present

## 2018-09-18 DIAGNOSIS — J410 Simple chronic bronchitis: Secondary | ICD-10-CM | POA: Diagnosis not present

## 2018-09-18 DIAGNOSIS — J449 Chronic obstructive pulmonary disease, unspecified: Secondary | ICD-10-CM | POA: Diagnosis present

## 2018-09-18 DIAGNOSIS — Z7982 Long term (current) use of aspirin: Secondary | ICD-10-CM | POA: Diagnosis not present

## 2018-09-18 DIAGNOSIS — E785 Hyperlipidemia, unspecified: Secondary | ICD-10-CM | POA: Diagnosis present

## 2018-09-18 DIAGNOSIS — J9811 Atelectasis: Secondary | ICD-10-CM | POA: Diagnosis not present

## 2018-09-18 DIAGNOSIS — G4733 Obstructive sleep apnea (adult) (pediatric): Secondary | ICD-10-CM | POA: Diagnosis present

## 2018-09-18 DIAGNOSIS — Z8546 Personal history of malignant neoplasm of prostate: Secondary | ICD-10-CM | POA: Diagnosis not present

## 2018-09-18 DIAGNOSIS — K567 Ileus, unspecified: Secondary | ICD-10-CM | POA: Diagnosis not present

## 2018-09-18 DIAGNOSIS — Z452 Encounter for adjustment and management of vascular access device: Secondary | ICD-10-CM | POA: Diagnosis not present

## 2018-09-18 DIAGNOSIS — K639 Disease of intestine, unspecified: Secondary | ICD-10-CM | POA: Diagnosis not present

## 2018-09-18 DIAGNOSIS — Z6835 Body mass index (BMI) 35.0-35.9, adult: Secondary | ICD-10-CM | POA: Diagnosis not present

## 2018-09-24 ENCOUNTER — Other Ambulatory Visit: Payer: Self-pay | Admitting: Cardiovascular Disease

## 2018-10-05 DIAGNOSIS — I10 Essential (primary) hypertension: Secondary | ICD-10-CM | POA: Diagnosis not present

## 2018-10-05 DIAGNOSIS — Z87891 Personal history of nicotine dependence: Secondary | ICD-10-CM | POA: Diagnosis not present

## 2018-10-05 DIAGNOSIS — Z8546 Personal history of malignant neoplasm of prostate: Secondary | ICD-10-CM | POA: Diagnosis not present

## 2018-10-05 DIAGNOSIS — Z483 Aftercare following surgery for neoplasm: Secondary | ICD-10-CM | POA: Diagnosis not present

## 2018-10-05 DIAGNOSIS — Z923 Personal history of irradiation: Secondary | ICD-10-CM | POA: Diagnosis not present

## 2018-10-05 DIAGNOSIS — G4733 Obstructive sleep apnea (adult) (pediatric): Secondary | ICD-10-CM | POA: Diagnosis not present

## 2018-10-05 DIAGNOSIS — I2584 Coronary atherosclerosis due to calcified coronary lesion: Secondary | ICD-10-CM | POA: Diagnosis not present

## 2018-10-05 DIAGNOSIS — C189 Malignant neoplasm of colon, unspecified: Secondary | ICD-10-CM | POA: Diagnosis not present

## 2018-10-05 DIAGNOSIS — Z9049 Acquired absence of other specified parts of digestive tract: Secondary | ICD-10-CM | POA: Diagnosis not present

## 2018-10-05 DIAGNOSIS — J449 Chronic obstructive pulmonary disease, unspecified: Secondary | ICD-10-CM | POA: Diagnosis not present

## 2018-10-05 DIAGNOSIS — C3412 Malignant neoplasm of upper lobe, left bronchus or lung: Secondary | ICD-10-CM | POA: Diagnosis not present

## 2018-10-09 DIAGNOSIS — C3412 Malignant neoplasm of upper lobe, left bronchus or lung: Secondary | ICD-10-CM | POA: Diagnosis not present

## 2018-10-09 DIAGNOSIS — C189 Malignant neoplasm of colon, unspecified: Secondary | ICD-10-CM | POA: Diagnosis not present

## 2018-10-09 DIAGNOSIS — Z483 Aftercare following surgery for neoplasm: Secondary | ICD-10-CM | POA: Diagnosis not present

## 2018-10-09 DIAGNOSIS — I2584 Coronary atherosclerosis due to calcified coronary lesion: Secondary | ICD-10-CM | POA: Diagnosis not present

## 2018-10-09 DIAGNOSIS — J449 Chronic obstructive pulmonary disease, unspecified: Secondary | ICD-10-CM | POA: Diagnosis not present

## 2018-10-09 DIAGNOSIS — I10 Essential (primary) hypertension: Secondary | ICD-10-CM | POA: Diagnosis not present

## 2018-10-11 DIAGNOSIS — Z483 Aftercare following surgery for neoplasm: Secondary | ICD-10-CM | POA: Diagnosis not present

## 2018-10-11 DIAGNOSIS — I10 Essential (primary) hypertension: Secondary | ICD-10-CM | POA: Diagnosis not present

## 2018-10-11 DIAGNOSIS — I2584 Coronary atherosclerosis due to calcified coronary lesion: Secondary | ICD-10-CM | POA: Diagnosis not present

## 2018-10-11 DIAGNOSIS — C3412 Malignant neoplasm of upper lobe, left bronchus or lung: Secondary | ICD-10-CM | POA: Diagnosis not present

## 2018-10-11 DIAGNOSIS — J449 Chronic obstructive pulmonary disease, unspecified: Secondary | ICD-10-CM | POA: Diagnosis not present

## 2018-10-11 DIAGNOSIS — C189 Malignant neoplasm of colon, unspecified: Secondary | ICD-10-CM | POA: Diagnosis not present

## 2018-10-15 DIAGNOSIS — I2584 Coronary atherosclerosis due to calcified coronary lesion: Secondary | ICD-10-CM | POA: Diagnosis not present

## 2018-10-15 DIAGNOSIS — C3412 Malignant neoplasm of upper lobe, left bronchus or lung: Secondary | ICD-10-CM | POA: Diagnosis not present

## 2018-10-15 DIAGNOSIS — Z483 Aftercare following surgery for neoplasm: Secondary | ICD-10-CM | POA: Diagnosis not present

## 2018-10-15 DIAGNOSIS — C189 Malignant neoplasm of colon, unspecified: Secondary | ICD-10-CM | POA: Diagnosis not present

## 2018-10-15 DIAGNOSIS — I10 Essential (primary) hypertension: Secondary | ICD-10-CM | POA: Diagnosis not present

## 2018-10-15 DIAGNOSIS — J449 Chronic obstructive pulmonary disease, unspecified: Secondary | ICD-10-CM | POA: Diagnosis not present

## 2018-10-16 ENCOUNTER — Ambulatory Visit: Payer: Medicare Other | Admitting: Thoracic Surgery (Cardiothoracic Vascular Surgery)

## 2018-10-17 ENCOUNTER — Telehealth: Payer: Self-pay | Admitting: Cardiovascular Disease

## 2018-10-17 DIAGNOSIS — I712 Thoracic aortic aneurysm, without rupture: Secondary | ICD-10-CM

## 2018-10-17 DIAGNOSIS — I7121 Aneurysm of the ascending aorta, without rupture: Secondary | ICD-10-CM

## 2018-10-17 NOTE — Telephone Encounter (Signed)
Order is in.

## 2018-10-17 NOTE — Telephone Encounter (Signed)
Spoke with wife Jacqlyn Larsen) - she misunderstood.  Did not realize you were trying to schedule for April.  She thought you meant now.  Patient is currently dealing with some cancer issues.  She agrees to scheduling for April for now, but if something comes up she will cancel or reschedule.

## 2018-10-17 NOTE — Telephone Encounter (Signed)
I called patient to schedule recall for April for  CT angio chest / aorta - repeat 6 months per SK (dx: Ascending thoracic aortic aneurysm) --agh   They are questioning why and if this needs to be done. They would like nurse to call back to discuss  He was recently in hospital both at [facility] and [facility]

## 2018-10-17 NOTE — Telephone Encounter (Signed)
Once order is completed - I will contact patient again to schedule

## 2018-10-17 NOTE — Telephone Encounter (Signed)
°  Precert needed for:  CT Angio Chest   Location: [facility]   Date: January 16, 2019 arrive 10:45  no liquids 4 hours prior

## 2018-10-18 DIAGNOSIS — C189 Malignant neoplasm of colon, unspecified: Secondary | ICD-10-CM | POA: Diagnosis not present

## 2018-10-18 DIAGNOSIS — I2584 Coronary atherosclerosis due to calcified coronary lesion: Secondary | ICD-10-CM | POA: Diagnosis not present

## 2018-10-18 DIAGNOSIS — Z483 Aftercare following surgery for neoplasm: Secondary | ICD-10-CM | POA: Diagnosis not present

## 2018-10-18 DIAGNOSIS — K219 Gastro-esophageal reflux disease without esophagitis: Secondary | ICD-10-CM | POA: Diagnosis not present

## 2018-10-18 DIAGNOSIS — J449 Chronic obstructive pulmonary disease, unspecified: Secondary | ICD-10-CM | POA: Diagnosis not present

## 2018-10-18 DIAGNOSIS — I10 Essential (primary) hypertension: Secondary | ICD-10-CM | POA: Diagnosis not present

## 2018-10-18 DIAGNOSIS — C3412 Malignant neoplasm of upper lobe, left bronchus or lung: Secondary | ICD-10-CM | POA: Diagnosis not present

## 2018-10-18 DIAGNOSIS — R05 Cough: Secondary | ICD-10-CM | POA: Diagnosis not present

## 2018-10-18 DIAGNOSIS — Z6832 Body mass index (BMI) 32.0-32.9, adult: Secondary | ICD-10-CM | POA: Diagnosis not present

## 2018-10-22 ENCOUNTER — Telehealth: Payer: Self-pay | Admitting: Cardiovascular Disease

## 2018-10-22 NOTE — Telephone Encounter (Signed)
Patient's wife called stating that patient had a surgical procedure at [facility] several weeks ago  He continues to cough. Went to see PCP last week and had chest xray to rule out pneumonia. Was tested for the flu.  Patient is beginning to have swelling in his feet. Appointment has been set for Tuesday 10/23/2018 but family wanted to make certain that this was ok to wait. Please call [phone].

## 2018-10-23 ENCOUNTER — Ambulatory Visit (INDEPENDENT_AMBULATORY_CARE_PROVIDER_SITE_OTHER): Payer: Medicare Other | Admitting: Cardiovascular Disease

## 2018-10-23 ENCOUNTER — Encounter: Payer: Self-pay | Admitting: Cardiovascular Disease

## 2018-10-23 ENCOUNTER — Encounter: Payer: Self-pay | Admitting: *Deleted

## 2018-10-23 VITALS — BP 130/78 | HR 89 | Ht 67.0 in | Wt 213.0 lb

## 2018-10-23 DIAGNOSIS — K219 Gastro-esophageal reflux disease without esophagitis: Secondary | ICD-10-CM

## 2018-10-23 DIAGNOSIS — E785 Hyperlipidemia, unspecified: Secondary | ICD-10-CM | POA: Diagnosis not present

## 2018-10-23 DIAGNOSIS — I5031 Acute diastolic (congestive) heart failure: Secondary | ICD-10-CM

## 2018-10-23 DIAGNOSIS — I2510 Atherosclerotic heart disease of native coronary artery without angina pectoris: Secondary | ICD-10-CM | POA: Diagnosis not present

## 2018-10-23 DIAGNOSIS — R0602 Shortness of breath: Secondary | ICD-10-CM | POA: Diagnosis not present

## 2018-10-23 DIAGNOSIS — I712 Thoracic aortic aneurysm, without rupture: Secondary | ICD-10-CM

## 2018-10-23 DIAGNOSIS — I10 Essential (primary) hypertension: Secondary | ICD-10-CM

## 2018-10-23 DIAGNOSIS — R600 Localized edema: Secondary | ICD-10-CM | POA: Diagnosis not present

## 2018-10-23 DIAGNOSIS — C3492 Malignant neoplasm of unspecified part of left bronchus or lung: Secondary | ICD-10-CM

## 2018-10-23 DIAGNOSIS — I7121 Aneurysm of the ascending aorta, without rupture: Secondary | ICD-10-CM

## 2018-10-23 DIAGNOSIS — C180 Malignant neoplasm of cecum: Secondary | ICD-10-CM

## 2018-10-23 MED ORDER — FUROSEMIDE 40 MG PO TABS: 40.0000 mg | ORAL_TABLET | Freq: Two times a day (BID) | ORAL | 1 refills | Status: DC

## 2018-10-23 MED ORDER — POTASSIUM CHLORIDE CRYS ER 20 MEQ PO TBCR: 20.0000 meq | EXTENDED_RELEASE_TABLET | Freq: Two times a day (BID) | ORAL | 1 refills | Status: DC

## 2018-10-23 NOTE — Progress Notes (Signed)
SUBJECTIVE: The patient presents for routine follow-up. He has a history of thoracic aortic aneurysm, coronary artery calcifications noted on CT scan, hypertension, and lower extremity edema.  Echocardiogram 08/14/2018 showed normal left ventricular systolic function, LVEF 60 to 16%, grade 1 diastolic dysfunction, and mild aortic and pulmonic regurgitation.  CT angiography of the chest on 07/27/2018 showed 4.6 cm ascending thoracic aortic aneurysm.  Semiannual imaging for follow-up was recommended.  There was also a 14 mm left upper lobe peripheral spiculated nodule previously 3 mm and suspicious for primary bronchogenic carcinoma. Coronary and aortic atherosclerosis was noted.  Enlarged central pulmonary arteries were suggestive of pulmonary arterial hypertension.  He saw CT surgery on 08/20/2018 and a left VATS for wedge resection and possible lingular sparing upper lobectomy scheduled for 09/10/2018.  He then had a PET scan on 08/16/2018 which demonstrated the left upper lobe nodule suspicious for primary bronchogenic carcinoma.  It also showed possibly cecal cancer.  He underwent a colonoscopy which confirmed cecal adenocarcinoma stage I.  He subsequently underwent right hemicolectomy at [facility].  I do not have these records.  He has had a bad cough for the past 2 weeks worse with lying down.  He has been sleeping in a recliner.  He was recently prescribed Protonix by his PCP as there was a concern for GERD.  He apparently underwent a chest x-ray which did not show CHF or pneumonia as per the family.  He is here with his wife and daughter.  He has had increased bilateral leg and feet swelling over the past few days.  He denies chest pain.    Social history: Married for 61 years in 2019.  His wife is also my patient.  She cleans [place] Wilson's house who is also my patient.  He worked at [employer] for 47 years.  They have 4 children, 2 of whom live next to them.  Review of  Systems: As per "subjective", otherwise negative.  No Known Allergies  Current Outpatient Medications  Medication Sig Dispense Refill  . amLODipine (NORVASC) 5 MG tablet TAKE 1 TABLET ONCE DAILY. 90 tablet 0  . aspirin EC 81 MG tablet Take 81 mg by mouth every other day. AT NIGHT    . atorvastatin (LIPITOR) 40 MG tablet TAKE (1) TABLET BY MOUTH ONCE DAILY. (Patient taking differently: Take 40 mg by mouth every evening. ) 90 tablet 1  . furosemide (LASIX) 40 MG tablet Take 1 tablet (40 mg total) by mouth daily as needed. (Patient taking differently: Take 40 mg by mouth daily as needed (fluid retention.). ) 90 tablet 1  . ibuprofen (ADVIL,MOTRIN) 200 MG tablet Take 400 mg by mouth every 8 (eight) hours as needed (for pain.). For pain     . metoprolol tartrate (LOPRESSOR) 25 MG tablet Take 12.5 mg by mouth every evening.      No current facility-administered medications for this visit.     Past Medical History:  Diagnosis Date  . Bladder neck contracture   . Complication of anesthesia   . COPD (chronic obstructive pulmonary disease) ([place]) PER CXRAY  10-28-2010  . Frequency of urination   . History of prostate cancer S/P SEED IMPLANTS   . History of urethral stricture   . History of urinary retention   . Nocturia   . OSA (obstructive sleep apnea) NON-COMPLIANT   does not use cpap  . Peyronie disease   . PONV (postoperative nausea and vomiting)   . Urinary incontinence  Past Surgical History:  Procedure Laterality Date  . [place] VITRECTOMY WITH 20 GAUGE MVR PORT Right 12/11/2012   Procedure: 25 GAUGE PARS PLANA VITRECTOMY WITH 20 GAUGE MVR PORT;  Surgeon: Hayden Pedro, MD;  Location: [place];  Service: Ophthalmology;  Laterality: Right;  Injection of silicone Oil  . CATARACT EXTRACTION W/ INTRAOCULAR LENS  IMPLANT, BILATERAL    . CYSTO/ URETHRAL DILATION  04-21-2008  . CYSTO/ URETHRAL DILATION/  URETHRA BALLOON DILATION / VAPORIZATION OF PROSTATE AND URETHRAL  STRICTURE  10-28-2010  . CYSTOSCOPY  01/27/2012   Procedure: CYSTOSCOPY;  Surgeon: Ailene Rud, MD;  Location: [facility];  Service: Urology;  Laterality: N/A;  cysto, cook balloon dilation of  bladder neck contracture gyrus button vaporization  carm   . EYE SURGERY     cataract removed bilaterally  . GAS INSERTION  10/23/2012   Procedure: INSERTION OF GAS;  Surgeon: Hayden Pedro, MD;  Location: [place];  Service: Ophthalmology;  Laterality: Right;  C3F8  . GAS/FLUID EXCHANGE Right 12/11/2012   Procedure: GAS/FLUID EXCHANGE;  Surgeon: Hayden Pedro, MD;  Location: [place];  Service: Ophthalmology;  Laterality: Right;  . IRIDECTOMY Right 12/11/2012   Procedure: IRIDECTOMY;  Surgeon: Hayden Pedro, MD;  Location: [place];  Service: Ophthalmology;  Laterality: Right;  . LASER PHOTO ABLATION Right 12/11/2012   Procedure: LASER PHOTO ABLATION;  Surgeon: Hayden Pedro, MD;  Location: [place];  Service: Ophthalmology;  Laterality: Right;  . MEMBRANE PEEL Right 12/11/2012   Procedure: MEMBRANE PEEL;  Surgeon: Hayden Pedro, MD;  Location: [place];  Service: Ophthalmology;  Laterality: Right;  . [place] INJECTION Right 12/11/2012   Procedure: PERFLUORONE INJECTION;  Surgeon: Hayden Pedro, MD;  Location: [place];  Service: Ophthalmology;  Laterality: Right;  Injection and Removal  . PHOTOCOAGULATION WITH LASER  10/23/2012   Procedure: PHOTOCOAGULATION WITH LASER;  Surgeon: Hayden Pedro, MD;  Location: [place];  Service: Ophthalmology;  Laterality: Right;  . [place] INJECTION  2003  . RADIOACTIVE SEED IMPLANTS, PROSTATE  06-20-2002  . SCLERAL BUCKLE  10/23/2012   Procedure: SCLERAL BUCKLE;  Surgeon: Hayden Pedro, MD;  Location: [place];  Service: Ophthalmology;  Laterality: Right;  . TRANSURETHRAL RESECTION OF BLADDER TUMOR  05/15/2012   Procedure: TRANSURETHRAL RESECTION OF BLADDER TUMOR (TURBT);  Surgeon: Ailene Rud, MD;  [facility];  Service: Urology;   Laterality: N/A;    Social History   Socioeconomic History  . Marital status: Married    Spouse name: Not on file  . Number of children: Not on file  . Years of education: Not on file  . Highest education level: Not on file  Occupational History  . Not on file  Social Needs  . Financial resource strain: Not on file  . Food insecurity:    Worry: Not on file    Inability: Not on file  . Transportation needs:    Medical: Not on file    Non-medical: Not on file  Tobacco Use  . Smoking status: Former Smoker    Packs/day: 2.00    Years: 20.00    Pack years: 40.00    Types: Cigarettes    Start date: 10/24/1955    Last attempt to quit: 01/24/1981    Years since quitting: 37.7  . Smokeless tobacco: Never Used  Substance and Sexual Activity  . Alcohol use: No    Alcohol/week: 0.0 standard drinks  . Drug use:  No  . Sexual activity: Not on file  Lifestyle  . Physical activity:    Days per week: Not on file    Minutes per session: Not on file  . Stress: Not on file  Relationships  . Social connections:    Talks on phone: Not on file    Gets together: Not on file    Attends religious service: Not on file    Active member of club or organization: Not on file    Attends meetings of clubs or organizations: Not on file    Relationship status: Not on file  . Intimate partner violence:    Fear of current or ex partner: Not on file    Emotionally abused: Not on file    Physically abused: Not on file    Forced sexual activity: Not on file  Other Topics Concern  . Not on file  Social History Narrative  . Not on file     Vitals:   10/23/18 1515  BP: 130/78  Pulse: 89  SpO2: 98%  Weight: 213 lb (96.6 kg)  Height: 5\' 7"  (1.702 m)    Wt Readings from Last 3 Encounters:  10/23/18 213 lb (96.6 kg)  08/20/18 227 lb (103 kg)  08/07/18 228 lb 6.4 oz (103.6 kg)     PHYSICAL EXAM General: NAD HEENT: Normal. Neck: No JVD, no thyromegaly. Lungs: Clear to auscultation  bilaterally with normal respiratory effort. CV: Regular rate and rhythm, normal S1/S2, no S3/S4, no murmur.  1+ pitting bilateral lower extremity edema.  Abdomen: Soft, nontender, no distention.  Neurologic: Alert and oriented.  Psych: Normal affect. Skin: Normal. Musculoskeletal: No gross deformities.    ECG: Reviewed above under Subjective   Labs: Lab Results  Component Value Date/Time   K 4.7 01/28/2014 07:52 PM   BUN 17 01/28/2014 07:52 PM   CREATININE 1.60 (H) 08/01/2016 11:13 AM   ALT 9 01/28/2014 07:52 PM   HGB 13.4 01/28/2014 07:52 PM     Lipids: No results found for: LDLCALC, LDLDIRECT, CHOL, TRIG, HDL     ASSESSMENT AND PLAN:  1.  Shortness of breath/cough/bilateral leg edema: He appears to have acute diastolic heart failure.  I will prescribe Lasix 40 mg twice daily for 3 days followed by 40 mg daily.  I will also prescribe supplemental potassium 20 mEq twice daily for 3 days followed by 20 mEq daily.  I will obtain a BNP today and obtain a basic metabolic panel within the next few days.  Most recent echocardiogram from November 2019 reviewed above with normal left ventricular systolic function and grade 1 diastolic dysfunction.  2.  Thoracic aortic aneurysm: Symptomatically stable.  CT angiography of the chest reviewed above with a 4.6 cm thoracic aortic aneurysm. He is on metoprolol.  He is followed by CT surgery.  3.  Hypertension: Blood pressure is normal.  I will monitor given diuretic requirement.  4.  Coronary artery calcifications: Noted on CT scan.  Asymptomatic.  I will monitor symptomatology.  He is on aspirin, metoprolol and atorvastatin.  5.  Hyperlipidemia: Currently on atorvastatin 40 mg.  I will obtain a copy of lipids from PCP.  6.  Primary bronchogenic carcinoma of left upper lobe: Followed by CT surgery.  He has yet to undergo VATS with wedge resection and possible lingular sparing upper lobectomy.  7.  Cecal adenocarcinoma stage I: Status  post right hemicolectomy.  8.  GERD: Recently prescribed Protonix 40 mg daily.   Disposition: Follow up 1  month  Time spent: 40 minutes, of which greater than 50% was spent reviewing symptoms, relevant blood tests and studies, and discussing management plan with the patient.    Kate Sable, M.D., F.A.C.C.

## 2018-10-23 NOTE — Patient Instructions (Addendum)
Medication Instructions:   Your physician has recommended you make the following change in your medication:   Take furosemide 40 mg by mouth twice daily for 3 days, then reduce to daily.  Take potassium 20 meq by mouth twice daily for 3 days, then reduce to daily  Continue all other medications the same.  Labwork:  Your physician recommends that you return for lab work in: TODAY to check your BNP.  Your physician recommends that you return for lab work in: Friday, 10/26/2018 to check your BMET.  Testing/Procedures:  NONE  Follow-Up:  Your physician recommends that you schedule a follow-up appointment in: 1 month with [facility] extender [facility] [place] [facility]  Any Other Special Instructions Will Be Listed Below (If Applicable).  If you need a refill on your cardiac medications before your next appointment, please call your pharmacy.

## 2018-10-24 DIAGNOSIS — J449 Chronic obstructive pulmonary disease, unspecified: Secondary | ICD-10-CM | POA: Diagnosis not present

## 2018-10-24 DIAGNOSIS — Z483 Aftercare following surgery for neoplasm: Secondary | ICD-10-CM | POA: Diagnosis not present

## 2018-10-24 DIAGNOSIS — C189 Malignant neoplasm of colon, unspecified: Secondary | ICD-10-CM | POA: Diagnosis not present

## 2018-10-24 DIAGNOSIS — C3412 Malignant neoplasm of upper lobe, left bronchus or lung: Secondary | ICD-10-CM | POA: Diagnosis not present

## 2018-10-24 DIAGNOSIS — I10 Essential (primary) hypertension: Secondary | ICD-10-CM | POA: Diagnosis not present

## 2018-10-24 DIAGNOSIS — I2584 Coronary atherosclerosis due to calcified coronary lesion: Secondary | ICD-10-CM | POA: Diagnosis not present

## 2018-10-25 ENCOUNTER — Telehealth: Payer: Self-pay | Admitting: *Deleted

## 2018-10-25 NOTE — Telephone Encounter (Signed)
Notes recorded by Laurine Blazer, LPN on 05/10/8109 at 3:15 AM EST Wife Jacqlyn Larsen) notified. Copy to pmd. Follow up already scheduled for 11/20/18 with Bernerd Pho, [place] in [place] office. ------  Notes recorded by Herminio Commons, MD on 10/24/2018 at 8:50 AM EST Unfortunately, N-terminal proBNP was checked and not BNP which is what I specifically ordered. I think this is a routine test at [facility] In any event, pressures appear to be elevated in the lungs and heart. For now let us pursue the recommended diuretic regimen. Have him call us back in 5 days to let us know how he is doing (to see if leg swelling and weight have gone down).

## 2018-10-26 DIAGNOSIS — R600 Localized edema: Secondary | ICD-10-CM | POA: Diagnosis not present

## 2018-10-26 DIAGNOSIS — I10 Essential (primary) hypertension: Secondary | ICD-10-CM | POA: Diagnosis not present

## 2018-10-29 DIAGNOSIS — C3412 Malignant neoplasm of upper lobe, left bronchus or lung: Secondary | ICD-10-CM | POA: Diagnosis not present

## 2018-10-29 DIAGNOSIS — Z483 Aftercare following surgery for neoplasm: Secondary | ICD-10-CM | POA: Diagnosis not present

## 2018-10-29 DIAGNOSIS — I10 Essential (primary) hypertension: Secondary | ICD-10-CM | POA: Diagnosis not present

## 2018-10-29 DIAGNOSIS — C189 Malignant neoplasm of colon, unspecified: Secondary | ICD-10-CM | POA: Diagnosis not present

## 2018-10-29 DIAGNOSIS — I2584 Coronary atherosclerosis due to calcified coronary lesion: Secondary | ICD-10-CM | POA: Diagnosis not present

## 2018-10-29 DIAGNOSIS — J449 Chronic obstructive pulmonary disease, unspecified: Secondary | ICD-10-CM | POA: Diagnosis not present

## 2018-10-31 ENCOUNTER — Telehealth: Payer: Self-pay | Admitting: Cardiovascular Disease

## 2018-10-31 NOTE — Telephone Encounter (Signed)
Jacqlyn Larsen (wife) called to give reports of patient's weight.

## 2018-10-31 NOTE — Telephone Encounter (Signed)
Returned call to pt's wife Jacqlyn Larsen). She states that her husband has lost 9 lbs in the last few days. He does not have any swelling in his feet or ankles. He is feeling a lot better she states. Will forward to DR. Koneswaran as an [place].

## 2018-11-01 ENCOUNTER — Telehealth: Payer: Self-pay | Admitting: *Deleted

## 2018-11-01 DIAGNOSIS — R7989 Other specified abnormal findings of blood chemistry: Secondary | ICD-10-CM

## 2018-11-01 DIAGNOSIS — I10 Essential (primary) hypertension: Secondary | ICD-10-CM

## 2018-11-01 NOTE — Telephone Encounter (Signed)
Steve Commons, MD  Laurine Blazer, LPN        Repeat BMET.      Notes recorded by Laurine Blazer, LPN on 2/29/7989 at 2:11 PM EST Please clarify on need for repeat labs. BNP done on 10/24/18 & BMET done on 10/26/18. What if any does he need to have repeated in 1 week ? ------  Notes recorded by Steve Commons, MD on 10/27/2018 at 11:15 AM EST Creatinine mildly elevated from baseline due to increased diuretic requirement. Please repeat in 1 week. ------  Notes recorded by Laurine Blazer, LPN on 9/41/7408 at 1:44 AM EST Wife Jacqlyn Larsen) notified. Copy to pmd. Follow up already scheduled for 11/20/18 with Bernerd Pho, [place] in [place] office. ------  Notes recorded by Steve Commons, MD on 10/24/2018 at 8:50 AM EST Unfortunately, N-terminal proBNP was checked and not BNP which is what I specifically ordered. I think this is a routine test at [facility]. In any event, pressures appear to be elevated in the lungs and heart. For now let us pursue the recommended diuretic regimen. Have him call us back in 5 days to let us know how he is doing (to see if leg swelling and weight have gone down).

## 2018-11-01 NOTE — Telephone Encounter (Signed)
Notes recorded by Laurine Blazer, LPN on 01/27/622 at 7:62 AM EST Wife Jacqlyn Larsen) notified. They will do at [facility] tomorrow. Lab order faxed today.

## 2018-11-02 ENCOUNTER — Telehealth: Payer: Self-pay | Admitting: *Deleted

## 2018-11-02 DIAGNOSIS — I10 Essential (primary) hypertension: Secondary | ICD-10-CM | POA: Diagnosis not present

## 2018-11-02 DIAGNOSIS — R7989 Other specified abnormal findings of blood chemistry: Secondary | ICD-10-CM | POA: Diagnosis not present

## 2018-11-02 NOTE — Telephone Encounter (Signed)
Notes recorded by Laurine Blazer, LPN on 0/96/2836 at 6:29 PM EST Wife [facility]) notified. Copy to pmd.  ------  Notes recorded by Herminio Commons, MD on 11/02/2018 at 4:58 PM EST Creatinine improving. Please forward a copy to PCP.

## 2018-11-04 DIAGNOSIS — G4733 Obstructive sleep apnea (adult) (pediatric): Secondary | ICD-10-CM | POA: Diagnosis not present

## 2018-11-04 DIAGNOSIS — Z9049 Acquired absence of other specified parts of digestive tract: Secondary | ICD-10-CM | POA: Diagnosis not present

## 2018-11-04 DIAGNOSIS — I10 Essential (primary) hypertension: Secondary | ICD-10-CM | POA: Diagnosis not present

## 2018-11-04 DIAGNOSIS — J449 Chronic obstructive pulmonary disease, unspecified: Secondary | ICD-10-CM | POA: Diagnosis not present

## 2018-11-04 DIAGNOSIS — I2584 Coronary atherosclerosis due to calcified coronary lesion: Secondary | ICD-10-CM | POA: Diagnosis not present

## 2018-11-04 DIAGNOSIS — Z923 Personal history of irradiation: Secondary | ICD-10-CM | POA: Diagnosis not present

## 2018-11-04 DIAGNOSIS — Z87891 Personal history of nicotine dependence: Secondary | ICD-10-CM | POA: Diagnosis not present

## 2018-11-04 DIAGNOSIS — C3412 Malignant neoplasm of upper lobe, left bronchus or lung: Secondary | ICD-10-CM | POA: Diagnosis not present

## 2018-11-04 DIAGNOSIS — Z483 Aftercare following surgery for neoplasm: Secondary | ICD-10-CM | POA: Diagnosis not present

## 2018-11-04 DIAGNOSIS — C189 Malignant neoplasm of colon, unspecified: Secondary | ICD-10-CM | POA: Diagnosis not present

## 2018-11-04 DIAGNOSIS — Z8546 Personal history of malignant neoplasm of prostate: Secondary | ICD-10-CM | POA: Diagnosis not present

## 2018-11-07 DIAGNOSIS — Z483 Aftercare following surgery for neoplasm: Secondary | ICD-10-CM | POA: Diagnosis not present

## 2018-11-07 DIAGNOSIS — J449 Chronic obstructive pulmonary disease, unspecified: Secondary | ICD-10-CM | POA: Diagnosis not present

## 2018-11-07 DIAGNOSIS — I2584 Coronary atherosclerosis due to calcified coronary lesion: Secondary | ICD-10-CM | POA: Diagnosis not present

## 2018-11-07 DIAGNOSIS — I10 Essential (primary) hypertension: Secondary | ICD-10-CM | POA: Diagnosis not present

## 2018-11-07 DIAGNOSIS — C189 Malignant neoplasm of colon, unspecified: Secondary | ICD-10-CM | POA: Diagnosis not present

## 2018-11-07 DIAGNOSIS — C3412 Malignant neoplasm of upper lobe, left bronchus or lung: Secondary | ICD-10-CM | POA: Diagnosis not present

## 2018-11-13 ENCOUNTER — Other Ambulatory Visit: Payer: Self-pay | Admitting: *Deleted

## 2018-11-13 ENCOUNTER — Ambulatory Visit (INDEPENDENT_AMBULATORY_CARE_PROVIDER_SITE_OTHER): Payer: Medicare Other | Admitting: Thoracic Surgery (Cardiothoracic Vascular Surgery)

## 2018-11-13 ENCOUNTER — Encounter: Payer: Self-pay | Admitting: *Deleted

## 2018-11-13 VITALS — BP 116/75 | HR 88 | Resp 20 | Ht 67.0 in | Wt 207.0 lb

## 2018-11-13 DIAGNOSIS — R911 Solitary pulmonary nodule: Secondary | ICD-10-CM

## 2018-11-13 DIAGNOSIS — I2510 Atherosclerotic heart disease of native coronary artery without angina pectoris: Secondary | ICD-10-CM

## 2018-11-13 DIAGNOSIS — C189 Malignant neoplasm of colon, unspecified: Secondary | ICD-10-CM | POA: Diagnosis not present

## 2018-11-13 NOTE — H&P (View-Only) (Signed)
[address]       [place] [address]             [phone]      HPI: Steve [name] returns to discuss management of his left upper lobe lung nodule.  Steve [name] is an 78 year old gentleman with a history of tobacco abuse (40 pack years, quit 40 years ago), hypertension, hyperlipidemia, prostate cancer, bladder neck contracture, urethral stricture, obstructive sleep apnea, a 4.5 cm ascending aneurysm, and a recently resected colon cancer.  He has been followed since 2015 for an ascending aneurysm.  In 2019 he had a CT to reassess the aneurysm.  It was stable but he had a left upper lobe lung nodule that had increased in size from 3 mm to 12 mm.  He did a PET CT which showed the nodule was hypermetabolic.  He also had a hypermetabolic lesion in his cecum.  The cecal lesion turned out to be a colon cancer.  He had a resection for that in December at [facility]  Apparently had an ileus postop and was in the hospital for about 2 weeks.  He feels well.  He is ready to go ahead and have his lung nodule taking care off.  He is not having any chest pain or shortness of breath.  He is walking with a cane but only carries it for safety.  He is not bearing any weight on it.  Zubrod Score: At the time of surgery this patient's most appropriate activity status/level should be described as: []     0    Normal activity, no symptoms []     1    Restricted in physical strenuous activity but ambulatory, able to do out light work [x]     2    Ambulatory and capable of self care, unable to do work activities, up and about >50 % of waking hours                              []     3    Only limited self care, in bed greater than 50% of waking hours []     4    Completely disabled, no self care, confined to bed or chair []     5    Moribund  Past Medical History:  Diagnosis Date  . Bladder neck contracture   . Complication of anesthesia   . COPD (chronic obstructive pulmonary disease)  ([place]) PER CXRAY  10-28-2010  . Frequency of urination   . History of prostate cancer S/P SEED IMPLANTS   . History of urethral stricture   . History of urinary retention   . Nocturia   . OSA (obstructive sleep apnea) NON-COMPLIANT   does not use cpap  . Peyronie disease   . PONV (postoperative nausea and vomiting)   . Urinary incontinence    Past Surgical History:  Procedure Laterality Date  . [place] VITRECTOMY WITH 20 GAUGE MVR PORT Right 12/11/2012   Procedure: 25 GAUGE PARS PLANA VITRECTOMY WITH 20 GAUGE MVR PORT;  Surgeon: Hayden Pedro, MD;  Location: [place];  Service: Ophthalmology;  Laterality: Right;  Injection of silicone Oil  . CATARACT EXTRACTION W/ INTRAOCULAR LENS  IMPLANT, BILATERAL    . CYSTO/ URETHRAL DILATION  04-21-2008  . CYSTO/ URETHRAL DILATION/  URETHRA BALLOON DILATION / VAPORIZATION OF PROSTATE AND URETHRAL STRICTURE  10-28-2010  . CYSTOSCOPY  01/27/2012   Procedure: CYSTOSCOPY;  Surgeon: Ailene Rud, MD;  Location: [facility];  Service: Urology;  Laterality: N/A;  cysto, cook balloon dilation of  bladder neck contracture gyrus button vaporization  carm   . EYE SURGERY     cataract removed bilaterally  . GAS INSERTION  10/23/2012   Procedure: INSERTION OF GAS;  Surgeon: Hayden Pedro, MD;  Location: [place];  Service: Ophthalmology;  Laterality: Right;  C3F8  . GAS/FLUID EXCHANGE Right 12/11/2012   Procedure: GAS/FLUID EXCHANGE;  Surgeon: Hayden Pedro, MD;  Location: [place];  Service: Ophthalmology;  Laterality: Right;  . IRIDECTOMY Right 12/11/2012   Procedure: IRIDECTOMY;  Surgeon: Hayden Pedro, MD;  Location: [place];  Service: Ophthalmology;  Laterality: Right;  . LASER PHOTO ABLATION Right 12/11/2012   Procedure: LASER PHOTO ABLATION;  Surgeon: Hayden Pedro, MD;  Location: [place];  Service: Ophthalmology;  Laterality: Right;  . MEMBRANE PEEL Right 12/11/2012   Procedure: MEMBRANE PEEL;  Surgeon: Hayden Pedro, MD;   Location: [place];  Service: Ophthalmology;  Laterality: Right;  . [place] INJECTION Right 12/11/2012   Procedure: PERFLUORONE INJECTION;  Surgeon: Hayden Pedro, MD;  Location: [place];  Service: Ophthalmology;  Laterality: Right;  Injection and Removal  . PHOTOCOAGULATION WITH LASER  10/23/2012   Procedure: PHOTOCOAGULATION WITH LASER;  Surgeon: Hayden Pedro, MD;  Location: [place];  Service: Ophthalmology;  Laterality: Right;  . [place] INJECTION  2003  . RADIOACTIVE SEED IMPLANTS, PROSTATE  06-20-2002  . SCLERAL BUCKLE  10/23/2012   Procedure: SCLERAL BUCKLE;  Surgeon: Hayden Pedro, MD;  Location: [place];  Service: Ophthalmology;  Laterality: Right;  . TRANSURETHRAL RESECTION OF BLADDER TUMOR  05/15/2012   Procedure: TRANSURETHRAL RESECTION OF BLADDER TUMOR (TURBT);  Surgeon: Ailene Rud, MD;  [facility];  Service: Urology;  Laterality: N/A;     Current Outpatient Medications  Medication Sig Dispense Refill  . amLODipine (NORVASC) 5 MG tablet TAKE 1 TABLET ONCE DAILY. 90 tablet 0  . aspirin EC 81 MG tablet Take 81 mg by mouth every other day. AT NIGHT    . atorvastatin (LIPITOR) 40 MG tablet TAKE (1) TABLET BY MOUTH ONCE DAILY. (Patient taking differently: Take 40 mg by mouth every evening. ) 90 tablet 1  . furosemide (LASIX) 40 MG tablet Take 1 tablet (40 mg total) by mouth 2 (two) times daily. For 3 days, then reduce to 40 mg daily 90 tablet 1  . ibuprofen (ADVIL,MOTRIN) 200 MG tablet Take 400 mg by mouth every 8 (eight) hours as needed (for pain.). For pain     . metoprolol tartrate (LOPRESSOR) 25 MG tablet Take 12.5 mg by mouth every evening.     . potassium chloride SA (K-DUR,KLOR-CON) 20 MEQ tablet Take 1 tablet (20 mEq total) by mouth 2 (two) times daily. For 3 days, then reduce to 20 meq daily 90 tablet 1   No current facility-administered medications for this visit.     Physical Exam BP 116/75   Pulse 88   Resp 20   Ht 5\' 7"  (1.702 m)   Wt 207 lb  (93.9 kg)   SpO2 92% Comment: RA  BMI 32.63 kg/m  78 year old man in no acute distress Well-developed and well-nourished Alert and oriented x3 with no focal neurologic deficits No cervical or supraclavicular adenopathy Lungs clear with equal breath sounds bilaterally Cardiac regular rate and rhythm normal S1 and S2 with no rubs or murmurs  Abdomen nondistended No clubbing cyanosis or edema  Diagnostic Tests: Pulmonary function testing FVC 2.80 (78%) FEV1 1.90 (75%) DLCO 14.84 (52%)  NUCLEAR MEDICINE PET SKULL BASE TO THIGH  TECHNIQUE: 11.4 mCi F-18 FDG was injected intravenously. Full-ring PET imaging was performed from the skull base to thigh after the radiotracer. CT data was obtained and used for attenuation correction and anatomic localization.  Fasting blood glucose: 98 mg/dl  COMPARISON:  Chest CT 07/27/2018  FINDINGS: Mediastinal blood pool activity: SUV max 3.46  NECK: No hypermetabolic lymph nodes in the neck.  Incidental CT findings: none  CHEST: No axillary, supraclavicular, mediastinal, or hilar hypermetabolic lymph nodes. The pulmonary nodule within the left upper lobe measures 1.3 cm and has an SUV max of 9.49. Calcified granuloma identified within the posteromedial right lower lobe. No hypermetabolic mediastinal or hilar lymph nodes.  Incidental CT findings: Moderate changes of emphysema. Aortic atherosclerosis. Calcification within the left main and LAD coronary arteries noted.  ABDOMEN/PELVIS: No abnormal hypermetabolic activity within the liver, pancreas, adrenal glands, or spleen. No hypermetabolic lymph nodes in the abdomen or pelvis. There is a focal area of intense uptake localizing to the right lower quadrant of the abdomen within the cecum. SUV max is equal to 20.66.  Incidental CT findings: Seed implants identified within the prostate gland.9 mm gallstone identified within the gallbladder fundus. There is a 4.2 cm mass arising  from the left adrenal gland containing foci of macroscopic fat compatible with a benign myelolipoma, image 121/4  SKELETON: No focal hypermetabolic activity to suggest skeletal metastasis.  Incidental CT findings: none  IMPRESSION: 1. The left upper lobe pulmonary nodule exhibits intense radiotracer uptake compatible with primary bronchogenic carcinoma. Assuming a non-small cell histology imaging findings are compatible with a T1bN0M0 lesion. 2. Focal area of increased radiotracer uptake localizing to the cecum with SUV max equal to 20.66. Underlying colonic neoplasm cannot be excluded and correlation with colonoscopy advised. 3. Aortic Atherosclerosis (ICD10-I70.0) and Emphysema (ICD10-J43.9). 4. Gallstone 5. Left adrenal gland myelolipoma.   Electronically Signed   By: Kerby Moors M.D.   On: 08/16/2018 13:08 I personally reviewedthe CT and PET/CT images and concur with the findings noted above  Impression: Steve [name] is an 78 year old gentleman with a remote history of tobacco abuse who was being followed for an ascending thoracic aortic aneurysm.  A CT of the chest showed an increase in size of the lung nodule from 3 to 12 mm over the course of the year.  On PET CT it was markedly hypermetabolic.  It is highly suspicious for a new primary bronchogenic carcinoma.  It could also be an infectious or inflammatory nodule.  He also was found to have a cecal mass on his PET/CT.  He is undergone a resection for that.  That was about 2 months ago.  He had a rough time initially but now has recovered.  It has been 3 months and I do think we need to repeat his CT prior to his surgical resection just to make sure there are not any new findings that would change our plan.  I recommended that we proceed with left VATS for wedge resection and possible segmentectomy or lobectomy.  I described the procedure in detail to Steve [name] and his family.  They understand the general nature of the  procedure, the incision to be used, the use of a drainage tube postoperatively, the expected hospital stay, and the overall recovery.  We discussed the indications, risk, benefits, and alternatives.  They understand the risks include, but not limited to death, MI, DVT, PE, bleeding, possible need for transfusion, infection, prolonged air leak, cardiac arrhythmias, as well as possibility of other unforeseeable complications.  He accepts the risks and wishes to proceed.  Plan: Repeat CT chest Plan left VATS for wedge resection and possible segmentectomy or lobectomy on Monday, 11/26/2018  Steve Nakayama, MD [facility] [phone]

## 2018-11-13 NOTE — Progress Notes (Signed)
[address]       [place] [address]             [phone]      HPI: Steve [name] returns to discuss management of his left upper lobe lung nodule.  Steve [name] is an 78 year old gentleman with a history of tobacco abuse (40 pack years, quit 40 years ago), hypertension, hyperlipidemia, prostate cancer, bladder neck contracture, urethral stricture, obstructive sleep apnea, a 4.5 cm ascending aneurysm, and a recently resected colon cancer.  He has been followed since 2015 for an ascending aneurysm.  In 2019 he had a CT to reassess the aneurysm.  It was stable but he had a left upper lobe lung nodule that had increased in size from 3 mm to 12 mm.  He did a PET CT which showed the nodule was hypermetabolic.  He also had a hypermetabolic lesion in his cecum.  The cecal lesion turned out to be a colon cancer.  He had a resection for that in December at [facility].  Apparently had an ileus postop and was in the hospital for about 2 weeks.  He feels well.  He is ready to go ahead and have his lung nodule taking care off.  He is not having any chest pain or shortness of breath.  He is walking with a cane but only carries it for safety.  He is not bearing any weight on it.  Zubrod Score: At the time of surgery this patient's most appropriate activity status/level should be described as: []     0    Normal activity, no symptoms []     1    Restricted in physical strenuous activity but ambulatory, able to do out light work [x]     2    Ambulatory and capable of self care, unable to do work activities, up and about >50 % of waking hours                              []     3    Only limited self care, in bed greater than 50% of waking hours []     4    Completely disabled, no self care, confined to bed or chair []     5    Moribund  Past Medical History:  Diagnosis Date  . Bladder neck contracture   . Complication of anesthesia   . COPD (chronic obstructive pulmonary disease)  ([place]) PER CXRAY  10-28-2010  . Frequency of urination   . History of prostate cancer S/P SEED IMPLANTS   . History of urethral stricture   . History of urinary retention   . Nocturia   . OSA (obstructive sleep apnea) NON-COMPLIANT   does not use cpap  . Peyronie disease   . PONV (postoperative nausea and vomiting)   . Urinary incontinence    Past Surgical History:  Procedure Laterality Date  . [place] VITRECTOMY WITH 20 GAUGE MVR PORT Right 12/11/2012   Procedure: 25 GAUGE PARS PLANA VITRECTOMY WITH 20 GAUGE MVR PORT;  Surgeon: Hayden Pedro, MD;  Location: [place];  Service: Ophthalmology;  Laterality: Right;  Injection of silicone Oil  . CATARACT EXTRACTION W/ INTRAOCULAR LENS  IMPLANT, BILATERAL    . CYSTO/ URETHRAL DILATION  04-21-2008  . CYSTO/ URETHRAL DILATION/  URETHRA BALLOON DILATION / VAPORIZATION OF PROSTATE AND URETHRAL STRICTURE  10-28-2010  . CYSTOSCOPY  01/27/2012   Procedure: CYSTOSCOPY;  Surgeon: Ailene Rud, MD;  Location: [facility];  Service: Urology;  Laterality: N/A;  cysto, cook balloon dilation of  bladder neck contracture gyrus button vaporization  carm   . EYE SURGERY     cataract removed bilaterally  . GAS INSERTION  10/23/2012   Procedure: INSERTION OF GAS;  Surgeon: Hayden Pedro, MD;  Location: [place];  Service: Ophthalmology;  Laterality: Right;  C3F8  . GAS/FLUID EXCHANGE Right 12/11/2012   Procedure: GAS/FLUID EXCHANGE;  Surgeon: Hayden Pedro, MD;  Location: [place];  Service: Ophthalmology;  Laterality: Right;  . IRIDECTOMY Right 12/11/2012   Procedure: IRIDECTOMY;  Surgeon: Hayden Pedro, MD;  Location: [place];  Service: Ophthalmology;  Laterality: Right;  . LASER PHOTO ABLATION Right 12/11/2012   Procedure: LASER PHOTO ABLATION;  Surgeon: Hayden Pedro, MD;  Location: [place];  Service: Ophthalmology;  Laterality: Right;  . MEMBRANE PEEL Right 12/11/2012   Procedure: MEMBRANE PEEL;  Surgeon: Hayden Pedro, MD;   Location: [place];  Service: Ophthalmology;  Laterality: Right;  . [place] INJECTION Right 12/11/2012   Procedure: PERFLUORONE INJECTION;  Surgeon: Hayden Pedro, MD;  Location: [place];  Service: Ophthalmology;  Laterality: Right;  Injection and Removal  . PHOTOCOAGULATION WITH LASER  10/23/2012   Procedure: PHOTOCOAGULATION WITH LASER;  Surgeon: Hayden Pedro, MD;  Location: [place];  Service: Ophthalmology;  Laterality: Right;  . [place] INJECTION  2003  . RADIOACTIVE SEED IMPLANTS, PROSTATE  06-20-2002  . SCLERAL BUCKLE  10/23/2012   Procedure: SCLERAL BUCKLE;  Surgeon: Hayden Pedro, MD;  Location: [place];  Service: Ophthalmology;  Laterality: Right;  . TRANSURETHRAL RESECTION OF BLADDER TUMOR  05/15/2012   Procedure: TRANSURETHRAL RESECTION OF BLADDER TUMOR (TURBT);  Surgeon: Ailene Rud, MD;  [facility];  Service: Urology;  Laterality: N/A;     Current Outpatient Medications  Medication Sig Dispense Refill  . amLODipine (NORVASC) 5 MG tablet TAKE 1 TABLET ONCE DAILY. 90 tablet 0  . aspirin EC 81 MG tablet Take 81 mg by mouth every other day. AT NIGHT    . atorvastatin (LIPITOR) 40 MG tablet TAKE (1) TABLET BY MOUTH ONCE DAILY. (Patient taking differently: Take 40 mg by mouth every evening. ) 90 tablet 1  . furosemide (LASIX) 40 MG tablet Take 1 tablet (40 mg total) by mouth 2 (two) times daily. For 3 days, then reduce to 40 mg daily 90 tablet 1  . ibuprofen (ADVIL,MOTRIN) 200 MG tablet Take 400 mg by mouth every 8 (eight) hours as needed (for pain.). For pain     . metoprolol tartrate (LOPRESSOR) 25 MG tablet Take 12.5 mg by mouth every evening.     . potassium chloride SA (K-DUR,KLOR-CON) 20 MEQ tablet Take 1 tablet (20 mEq total) by mouth 2 (two) times daily. For 3 days, then reduce to 20 meq daily 90 tablet 1   No current facility-administered medications for this visit.     Physical Exam BP 116/75   Pulse 88   Resp 20   Ht 5\' 7"  (1.702 m)   Wt 207 lb  (93.9 kg)   SpO2 92% Comment: RA  BMI 32.13 kg/m  78 year old man in no acute distress Well-developed and well-nourished Alert and oriented x3 with no focal neurologic deficits No cervical or supraclavicular adenopathy Lungs clear with equal breath sounds bilaterally Cardiac regular rate and rhythm normal S1 and S2 with no rubs or murmurs  Abdomen nondistended No clubbing cyanosis or edema  Diagnostic Tests: Pulmonary function testing FVC 2.80 (78%) FEV1 1.90 (75%) DLCO 14.84 (52%)  NUCLEAR MEDICINE PET SKULL BASE TO THIGH  TECHNIQUE: 11.4 mCi F-18 FDG was injected intravenously. Full-ring PET imaging was performed from the skull base to thigh after the radiotracer. CT data was obtained and used for attenuation correction and anatomic localization.  Fasting blood glucose: 98 mg/dl  COMPARISON:  Chest CT 07/27/2018  FINDINGS: Mediastinal blood pool activity: SUV max 3.46  NECK: No hypermetabolic lymph nodes in the neck.  Incidental CT findings: none  CHEST: No axillary, supraclavicular, mediastinal, or hilar hypermetabolic lymph nodes. The pulmonary nodule within the left upper lobe measures 1.3 cm and has an SUV max of 9.49. Calcified granuloma identified within the posteromedial right lower lobe. No hypermetabolic mediastinal or hilar lymph nodes.  Incidental CT findings: Moderate changes of emphysema. Aortic atherosclerosis. Calcification within the left main and LAD coronary arteries noted.  ABDOMEN/PELVIS: No abnormal hypermetabolic activity within the liver, pancreas, adrenal glands, or spleen. No hypermetabolic lymph nodes in the abdomen or pelvis. There is a focal area of intense uptake localizing to the right lower quadrant of the abdomen within the cecum. SUV max is equal to 20.66.  Incidental CT findings: Seed implants identified within the prostate gland.9 mm gallstone identified within the gallbladder fundus. There is a 4.2 cm mass arising  from the left adrenal gland containing foci of macroscopic fat compatible with a benign myelolipoma, image 121/4  SKELETON: No focal hypermetabolic activity to suggest skeletal metastasis.  Incidental CT findings: none  IMPRESSION: 1. The left upper lobe pulmonary nodule exhibits intense radiotracer uptake compatible with primary bronchogenic carcinoma. Assuming a non-small cell histology imaging findings are compatible with a T1bN0M0 lesion. 2. Focal area of increased radiotracer uptake localizing to the cecum with SUV max equal to 20.66. Underlying colonic neoplasm cannot be excluded and correlation with colonoscopy advised. 3. Aortic Atherosclerosis (ICD10-I70.0) and Emphysema (ICD10-J43.9). 4. Gallstone 5. Left adrenal gland myelolipoma.   Electronically Signed   By: Kerby Moors M.D.   On: 08/16/2018 13:08 I personally reviewedthe CT and PET/CT images and concur with the findings noted above  Impression: Steve [name] is an 78 year old gentleman with a remote history of tobacco abuse who was being followed for an ascending thoracic aortic aneurysm.  A CT of the chest showed an increase in size of the lung nodule from 3 to 12 mm over the course of the year.  On PET CT it was markedly hypermetabolic.  It is highly suspicious for a new primary bronchogenic carcinoma.  It could also be an infectious or inflammatory nodule.  He also was found to have a cecal mass on his PET/CT.  He is undergone a resection for that.  That was about 2 months ago.  He had a rough time initially but now has recovered.  It has been 3 months and I do think we need to repeat his CT prior to his surgical resection just to make sure there are not any new findings that would change our plan.  I recommended that we proceed with left VATS for wedge resection and possible segmentectomy or lobectomy.  I described the procedure in detail to Steve [name] and his family.  They understand the general nature of the  procedure, the incision to be used, the use of a drainage tube postoperatively, the expected hospital stay, and the overall recovery.  We discussed the indications, risk, benefits, and alternatives.  They understand the risks include, but not limited to death, MI, DVT, PE, bleeding, possible need for transfusion, infection, prolonged air leak, cardiac arrhythmias, as well as possibility of other unforeseeable complications.  He accepts the risks and wishes to proceed.  Plan: Repeat CT chest Plan left VATS for wedge resection and possible segmentectomy or lobectomy on Monday, 11/26/2018  [place] Nakayama, MD [facility] [phone]

## 2018-11-16 ENCOUNTER — Ambulatory Visit (HOSPITAL_COMMUNITY)
Admission: RE | Admit: 2018-11-16 | Discharge: 2018-11-16 | Disposition: A | Discharge status: Home / Self Care | Payer: Medicare Other | Source: Ambulatory Visit | Attending: Thoracic Surgery (Cardiothoracic Vascular Surgery) | Admitting: Thoracic Surgery (Cardiothoracic Vascular Surgery)

## 2018-11-16 DIAGNOSIS — C189 Malignant neoplasm of colon, unspecified: Secondary | ICD-10-CM | POA: Diagnosis not present

## 2018-11-16 DIAGNOSIS — J449 Chronic obstructive pulmonary disease, unspecified: Secondary | ICD-10-CM | POA: Diagnosis not present

## 2018-11-16 DIAGNOSIS — I10 Essential (primary) hypertension: Secondary | ICD-10-CM | POA: Diagnosis not present

## 2018-11-16 DIAGNOSIS — I2584 Coronary atherosclerosis due to calcified coronary lesion: Secondary | ICD-10-CM | POA: Diagnosis not present

## 2018-11-16 DIAGNOSIS — Z483 Aftercare following surgery for neoplasm: Secondary | ICD-10-CM | POA: Diagnosis not present

## 2018-11-16 DIAGNOSIS — C3412 Malignant neoplasm of upper lobe, left bronchus or lung: Secondary | ICD-10-CM | POA: Diagnosis not present

## 2018-11-16 DIAGNOSIS — R911 Solitary pulmonary nodule: Secondary | ICD-10-CM | POA: Diagnosis not present

## 2018-11-20 ENCOUNTER — Ambulatory Visit: Payer: Medicare Other | Admitting: Thoracic Surgery (Cardiothoracic Vascular Surgery)

## 2018-11-20 ENCOUNTER — Ambulatory Visit (INDEPENDENT_AMBULATORY_CARE_PROVIDER_SITE_OTHER): Payer: Medicare Other | Admitting: Student

## 2018-11-20 ENCOUNTER — Encounter: Payer: Self-pay | Admitting: Student

## 2018-11-20 VITALS — BP 110/60 | HR 66 | Ht 67.0 in | Wt 212.8 lb

## 2018-11-20 DIAGNOSIS — I712 Thoracic aortic aneurysm, without rupture, unspecified: Secondary | ICD-10-CM

## 2018-11-20 DIAGNOSIS — I10 Essential (primary) hypertension: Secondary | ICD-10-CM | POA: Diagnosis not present

## 2018-11-20 DIAGNOSIS — N183 Chronic kidney disease, stage 3 unspecified: Secondary | ICD-10-CM

## 2018-11-20 DIAGNOSIS — C180 Malignant neoplasm of cecum: Secondary | ICD-10-CM

## 2018-11-20 DIAGNOSIS — I5032 Chronic diastolic (congestive) heart failure: Secondary | ICD-10-CM

## 2018-11-20 DIAGNOSIS — I2510 Atherosclerotic heart disease of native coronary artery without angina pectoris: Secondary | ICD-10-CM

## 2018-11-20 DIAGNOSIS — C3492 Malignant neoplasm of unspecified part of left bronchus or lung: Secondary | ICD-10-CM

## 2018-11-20 MED ORDER — FUROSEMIDE 40 MG PO TABS: 40.0000 mg | ORAL_TABLET | Freq: Every day | ORAL | 3 refills | Status: AC

## 2018-11-20 MED ORDER — POTASSIUM CHLORIDE CRYS ER 20 MEQ PO TBCR: 20.0000 meq | EXTENDED_RELEASE_TABLET | Freq: Every day | ORAL | 3 refills | Status: AC

## 2018-11-20 NOTE — Patient Instructions (Addendum)
Medication Instructions:  Your physician recommends that you continue on your current medications as directed. Please refer to the Current Medication list given to you today.  If you need a refill on your cardiac medications before your next appointment, please call your pharmacy.   Lab work: NONE   If you have labs (blood work) drawn today and your tests are completely normal, you will receive your results only by: Marland Kitchen MyChart Message (if you have MyChart) OR . A paper copy in the mail If you have any lab test that is abnormal or we need to change your treatment, we will call you to review the results.  Testing/Procedures: Your physician recommends that you weigh, daily, at the same time every day, and in the same amount of clothing. Please record your daily weights on the handout provided and bring it to your next appointment.    Follow-Up: At [facility], you and your health needs are our priority.  As part of our continuing mission to provide you with exceptional heart care, we have created designated Provider Care Teams.  These Care Teams include your primary Cardiologist (physician) and Advanced Practice Providers (APPs -  Physician Assistants and Nurse Practitioners) who all work together to provide you with the care you need, when you need it. You will need a follow up appointment in 2 months.  Please call our office 2 months in advance to schedule this appointment.  You may see Kate Sable, MD or one of the following Advanced Practice Providers on your designated Care Team:   Bernerd Pho, PA-C [facility]) . Ermalinda Barrios, PA-C ([place])  Any Other Special Instructions Will Be Listed Below (If Applicable). Thank you for choosing [place]!     Limit daily fluid intake to less than 2 Liters per day. Please limit salt intake.  Please weight yourself every morning. Take an extra Lasix tablet if weight increases by 3 pounds overnight or 5  pounds in a single week.

## 2018-11-20 NOTE — Progress Notes (Signed)
Cardiology Office Note    Date:  11/20/2018   ID:  Steve [name], Steve [name] 03/12/1941, MRN [id number]  PCP:  Caryl Bis, MD  Cardiologist: Kate Sable, MD    Chief Complaint  Patient presents with  . Follow-up    1 month visit    History of Present Illness:    Steve [name] is a 78 y.o. male with past medical history of thoracic aortic aneurysm (at 4.6 cm by imaging in 07/2018), coronary artery calcifications by prior CT, HTN, HLD, and Stage 3 CKD who presents to the office today for 24-month follow-up.  He was last examined by Dr. Bronson Ing in 10/2018 and had recently been evaluated by CT Surgery for a left VATS procedure for wedge resection and possible segmentectomy or lobectomy in regards to recent PET scan showing concerns for primary bronchogenic carcinoma. He had been evaluated at [facility] in the interim as well and underwent a colonoscopy which confirmed adenocarcinoma and a right hemicolectomy was performed. At the time of his office visit, he reported a progressive cough over the past 2 weeks which was worse with lying down. Also noted worsening lower extremity edema but denied any chest pain. Pro-BNP was elevated to 691 and it was recommended he increase Lasix to 40 mg twice daily for 3 days followed by 40 mg daily. The patient's wife called on 10/31/2018 to report he had lost 9 pounds within the past few days. Creatinine had trended upwards to 1.86 on 10/26/2018 but had improved to 1.72 by repeat labs on 11/02/2018.  In talking with the patient today, he reports overall doing well from a cardiac perspective since his last office visit. Says that his breathing significantly improved within a few days of starting Lasix and he denies any recurrent orthopnea or lower extremity edema. No recent chest pain, palpitations, or dyspnea on exertion. He does walk with a cane but mostly for balance issues.  He has been weighing himself approximately once a week and says that weight  has gradually trended upwards by 2 to 3 pounds over the past several weeks but he has also experienced an increased appetite during this timeframe as well. Reports good compliance with his current medication regimen.  Past Medical History:  Diagnosis Date  . Bladder neck contracture   . Complication of anesthesia   . COPD (chronic obstructive pulmonary disease) ([place]) PER CXRAY  10-28-2010  . Frequency of urination   . History of prostate cancer S/P SEED IMPLANTS   . History of urethral stricture   . History of urinary retention   . Nocturia   . OSA (obstructive sleep apnea) NON-COMPLIANT   does not use cpap  . Peyronie disease   . PONV (postoperative nausea and vomiting)   . Urinary incontinence     Past Surgical History:  Procedure Laterality Date  . [place] VITRECTOMY WITH 20 GAUGE MVR PORT Right 12/11/2012   Procedure: 25 GAUGE PARS PLANA VITRECTOMY WITH 20 GAUGE MVR PORT;  Surgeon: Hayden Pedro, MD;  Location: [place];  Service: Ophthalmology;  Laterality: Right;  Injection of silicone Oil  . CATARACT EXTRACTION W/ INTRAOCULAR LENS  IMPLANT, BILATERAL    . CYSTO/ URETHRAL DILATION  04-21-2008  . CYSTO/ URETHRAL DILATION/  URETHRA BALLOON DILATION / VAPORIZATION OF PROSTATE AND URETHRAL STRICTURE  10-28-2010  . CYSTOSCOPY  01/27/2012   Procedure: CYSTOSCOPY;  Surgeon: Ailene Rud, MD;  Location: [facility];  Service: Urology;  Laterality: N/A;  cysto, cook balloon dilation of  bladder neck contracture gyrus button vaporization  carm   . EYE SURGERY     cataract removed bilaterally  . GAS INSERTION  10/23/2012   Procedure: INSERTION OF GAS;  Surgeon: Hayden Pedro, MD;  Location: [place];  Service: Ophthalmology;  Laterality: Right;  [id number]  . GAS/FLUID EXCHANGE Right 12/11/2012   Procedure: GAS/FLUID EXCHANGE;  Surgeon: Hayden Pedro, MD;  Location: [place];  Service: Ophthalmology;  Laterality: Right;  . IRIDECTOMY Right 12/11/2012   Procedure:  IRIDECTOMY;  Surgeon: Hayden Pedro, MD;  Location: [place];  Service: Ophthalmology;  Laterality: Right;  . LASER PHOTO ABLATION Right 12/11/2012   Procedure: LASER PHOTO ABLATION;  Surgeon: Hayden Pedro, MD;  Location: [place];  Service: Ophthalmology;  Laterality: Right;  . MEMBRANE PEEL Right 12/11/2012   Procedure: MEMBRANE PEEL;  Surgeon: Hayden Pedro, MD;  Location: [place];  Service: Ophthalmology;  Laterality: Right;  . [place] INJECTION Right 12/11/2012   Procedure: PERFLUORONE INJECTION;  Surgeon: Hayden Pedro, MD;  Location: [place];  Service: Ophthalmology;  Laterality: Right;  Injection and Removal  . PHOTOCOAGULATION WITH LASER  10/23/2012   Procedure: PHOTOCOAGULATION WITH LASER;  Surgeon: Hayden Pedro, MD;  Location: [place];  Service: Ophthalmology;  Laterality: Right;  . [place] INJECTION  2003  . RADIOACTIVE SEED IMPLANTS, PROSTATE  06-20-2002  . SCLERAL BUCKLE  10/23/2012   Procedure: SCLERAL BUCKLE;  Surgeon: Hayden Pedro, MD;  Location: [place] [place];  Service: Ophthalmology;  Laterality: Right;  . TRANSURETHRAL RESECTION OF BLADDER TUMOR  05/15/2012   Procedure: TRANSURETHRAL RESECTION OF BLADDER TUMOR (TURBT);  Surgeon: Ailene Rud, MD;  Location: [facility];  Service: Urology;  Laterality: N/A;    Current Medications: Outpatient Medications Prior to Visit  Medication Sig Dispense Refill  . amLODipine (NORVASC) 5 MG tablet TAKE 1 TABLET ONCE DAILY. (Patient taking differently: Take 5 mg by mouth daily. TAKE 1 TABLET ONCE DAILY.) 90 tablet 0  . aspirin EC 81 MG tablet Take 81 mg by mouth every other day.     Marland Kitchen atorvastatin (LIPITOR) 40 MG tablet TAKE (1) TABLET BY MOUTH ONCE DAILY. (Patient taking differently: Take 40 mg by mouth every evening. ) 90 tablet 1  . ibuprofen (ADVIL,MOTRIN) 200 MG tablet Take 400 mg by mouth every 8 (eight) hours as needed for moderate pain.     . metoprolol tartrate (LOPRESSOR) 25 MG tablet Take 12.5 mg by mouth every evening.       . furosemide (LASIX) 40 MG tablet Take 1 tablet (40 mg total) by mouth 2 (two) times daily. For 3 days, then reduce to 40 mg daily (Patient taking differently: Take 40 mg by mouth daily. ) 90 tablet 1  . potassium chloride SA (K-DUR,KLOR-CON) 20 MEQ tablet Take 1 tablet (20 mEq total) by mouth 2 (two) times daily. For 3 days, then reduce to 20 meq daily (Patient taking differently: Take 20 mEq by mouth daily. ) 90 tablet 1   No facility-administered medications prior to visit.      Allergies:   Patient has no known allergies.   Social History   Socioeconomic History  . Marital status: Married    Spouse name: Not on file  . Number of children: Not on file  . Years of education: Not on file  . Highest education level: Not on file  Occupational History  . Not on file  Social Needs  . Financial resource strain:  Not on file  . Food insecurity:    Worry: Not on file    Inability: Not on file  . Transportation needs:    Medical: Not on file    Non-medical: Not on file  Tobacco Use  . Smoking status: Former Smoker    Packs/day: 2.00    Years: 20.00    Pack years: 40.00    Types: Cigarettes    Start date: 10/24/1955    Last attempt to quit: 01/24/1981    Years since quitting: 37.8  . Smokeless tobacco: Never Used  Substance and Sexual Activity  . Alcohol use: No    Alcohol/week: 0.0 standard drinks  . Drug use: No  . Sexual activity: Not on file  Lifestyle  . Physical activity:    Days per week: Not on file    Minutes per session: Not on file  . Stress: Not on file  Relationships  . Social connections:    Talks on phone: Not on file    Gets together: Not on file    Attends religious service: Not on file    Active member of club or organization: Not on file    Attends meetings of clubs or organizations: Not on file    Relationship status: Not on file  Other Topics Concern  . Not on file  Social History Narrative  . Not on file     Family History:  The patient's  family history includes Hypertension in his father.   Review of Systems:   Please see the history of present illness.     General:  No chills, fever, night sweats or weight changes.  Cardiovascular:  No chest pain, dyspnea on exertion, palpitations, paroxysmal nocturnal dyspnea. Positive for orthopnea (now resolved).  Dermatological: No rash, lesions/masses Respiratory: No cough, dyspnea Urologic: No hematuria, dysuria Abdominal:   No nausea, vomiting, diarrhea, bright red blood per rectum, melena, or hematemesis Neurologic:  No visual changes, wkns, changes in mental status. All other systems reviewed and are otherwise negative except as noted above.   Physical Exam:    VS:  BP 110/60   Pulse 66   Ht 5\' 7"  (1.702 m)   Wt 212 lb 12.8 oz (96.5 kg)   SpO2 94%   BMI 33.33 kg/m    General: Well developed, elderly Caucasian male appearing in no acute distress. Head: Normocephalic, atraumatic, sclera non-icteric, no xanthomas, nares are without discharge.  Neck: No carotid bruits. JVD not elevated.  Lungs: Respirations regular and unlabored, without wheezes or rales.  Heart: Regular rate and rhythm. No S3 or S4.  No murmur, no rubs, or gallops appreciated. Abdomen: Soft, non-tender, non-distended with normoactive bowel sounds. No hepatomegaly. No rebound/guarding. No obvious abdominal masses. Msk:  Strength and tone appear normal for age. No joint deformities or effusions. Extremities: No clubbing or cyanosis. No lower extremity edema.  Distal pedal pulses are 2+ bilaterally. Neuro: Alert and oriented X 3. Moves all extremities spontaneously. No focal deficits noted. Psych:  Responds to questions appropriately with a normal affect. Skin: No rashes or lesions noted  Wt Readings from Last 3 Encounters:  11/20/18 212 lb 12.8 oz (96.5 kg)  11/13/18 207 lb (93.9 kg)  10/23/18 213 lb (96.6 kg)     Studies/Labs Reviewed:   EKG:  EKG is not ordered today.   Recent Labs: No results  found for requested labs within last 8760 hours.   Lipid Panel No results found for: CHOL, TRIG, HDL, CHOLHDL, VLDL, LDLCALC, LDLDIRECT  Additional studies/ records that were reviewed today include:   Echocardiogram: 08/2018 Study Conclusions  - Left ventricle: The cavity size was normal. Wall thickness was   increased in a pattern of mild LVH. Systolic function was normal.   The estimated ejection fraction was in the range of 60% to 65%.   Wall motion was normal; there were no regional wall motion   abnormalities. Doppler parameters are consistent with abnormal   left ventricular relaxation (grade 1 diastolic dysfunction).   Doppler parameters are consistent with indeterminate ventricular   filling pressure. - Ventricular septum: Septal motion showed abnormal function and   dyssynergy. These changes are consistent with intraventricular   conduction delay. - Aortic valve: There was mild regurgitation. - Pulmonic valve: There was mild regurgitation.  Assessment:    1. Chronic diastolic (congestive) heart failure ([place])   2. Thoracic aortic aneurysm without rupture ([place])   3. Coronary artery calcification seen on CT scan   4. Essential hypertension   5. CKD (chronic kidney disease) stage 3, GFR 30-59 ml/min (H[facility])   6. Bronchogenic carcinoma of left lung ([place])   7. 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 Chronic Diastolic CHF - Recently had issues with fluid retention following hospital admission during which he received IVF post-operatively. He was started on Lasix as outlined above at the time of his last office visit and reports his orthopnea has resolved. No recurrent dyspnea on exertion or lower extremity edema. - Given stable kidney function, will continue on Lasix 40 mg daily at this time. We reviewed that he can take an extra tablet as needed for weight gain greater than 3 pounds overnight or greater than 5 pounds in 1 week. Sodium  restriction reviewed. He was encouraged to follow daily weights on his home scales as this will likely be variable over the next few weeks given his upcoming surgery and admission.  2. Thoracic Aortic Aneurysm - at 4.6 cm by CT imaging in 07/2018. Will continue to follow.   3. Coronary Artery Calcifications by Prior CT - Noted by prior CT imaging. He denies any recent chest pain or dyspnea on exertion  Recent echocardiogram in 08/2018 showed a preserved EF of 60 to 65% with no wall motion abnormalities. - Remains on ASA, beta-blocker, and statin therapy.   4. HTN - BP is well controlled at 110/60 during today's visit. Continue Amlodipine 5 mg daily and Lopressor 12.5 mg daily (did not tolerate BID dosing in the past - would consider transition to Toprol-XL for sustained release following upcoming surgery).  5. Stage 3 CKD - baseline creatinine 1.5 - 1.6. Peaked at 1.86 following recent titration of Lasix but improved to 1.72 by repeat labs.   6. Primary Bronchogenic Carcinoma - being followed by CT surgery with plans for a left VATS procedure for wedge resection and possible segmentectomy or lobectomy on 11/26/2018.  7.  Cecal Adenocarcinoma - s/p right hemicolectomy at [facility] in 09/2018. Followed by GI.     Medication Adjustments/Labs and Tests Ordered: Current medicines are reviewed at length with the patient today.  Concerns regarding medicines are outlined above.  Medication changes, Labs and Tests ordered today are listed in the Patient Instructions below. Patient Instructions  Medication Instructions:  Your physician recommends that you continue on your current medications as directed. Please refer to the Current Medication list given to you today.  If you need a refill on your cardiac  medications before your next appointment, please call your pharmacy.   Lab work: NONE   If you have labs (blood work) drawn today and your tests are completely normal, you will receive  your results only by: Marland Kitchen MyChart Message (if you have MyChart) OR . A paper copy in the mail If you have any lab test that is abnormal or we need to change your treatment, we will call you to review the results.  Testing/Procedures: Your physician recommends that you weigh, daily, at the same time every day, and in the same amount of clothing. Please record your daily weights on the handout provided and bring it to your next appointment.  Follow-Up: At [facility], you and your health needs are our priority.  As part of our continuing mission to provide you with exceptional heart care, we have created designated Provider Care Teams.  These Care Teams include your primary Cardiologist (physician) and Advanced Practice Providers (APPs -  Physician Assistants and Nurse Practitioners) who all work together to provide you with the care you need, when you need it. You will need a follow up appointment in 2 months.  Please call our office 2 months in advance to schedule this appointment.  You may see Kate Sable, MD or one of the following Advanced Practice Providers on your designated Care Team:   Bernerd Pho, PA-C [facility]) . Ermalinda Barrios, PA-C ([place])  Any Other Special Instructions Will Be Listed Below (If Applicable). Thank you for choosing [place]!   Limit daily fluid intake to less than 2 Liters per day. Please limit salt intake.  Please weight yourself every morning. Take an extra Lasix tablet if weight increases by 3 pounds overnight or 5 pounds in a single week.   Signed, Erma Heritage, PA-C  11/20/2018 4:14 PM    [facility] [facility] [place], [place] [address] Phone: [phone] Fax: [phone]

## 2018-11-21 NOTE — Pre-Procedure Instructions (Signed)
Steve [name]  11/21/2018      [facility] - [place], [place] [address] Phone: [phone] Fax: [phone]    Your procedure is scheduled on November 26, 2018.  Report to [facility] Admitting at 530 AM.  Call this number if you have problems the morning of surgery:  [phone]   Remember:  Do not eat or drink after midnight.    Take these medicines the morning of surgery with A SIP OF WATER  Amlodipine (Norvasc)   Follow your surgeon's instructions on when to hold/resume aspirin.  If no instructions were given call the office to determine how they would like to you take aspirin  7 days prior to surgery STOP taking any Aleve, Naproxen, Ibuprofen, Motrin, Advil, Goody's, BC's, all herbal medications, fish oil, and all vitamins     Do not wear jewelry.  Do not wear lotions, powders, or colognes, or deodorant.  Men may shave face and neck.  Do not bring valuables to the hospital.  [facility] is not responsible for any belongings or valuables.  Contacts, dentures or bridgework may not be worn into surgery.  Leave your suitcase in the car.  After surgery it may be brought to your room.  For patients admitted to the hospital, discharge time will be determined by your treatment team.  Patients discharged the day of surgery will not be allowed to drive home.    [facility]- Preparing For Surgery  Before surgery, you can play an important role. Because skin is not sterile, your skin needs to be as free of germs as possible. You can reduce the number of germs on your skin by washing with CHG (chlorahexidine gluconate) Soap before surgery.  CHG is an antiseptic cleaner which kills germs and bonds with the skin to continue killing germs even after washing.    Oral Hygiene is also important to reduce your risk of infection.  Remember - BRUSH YOUR TEETH THE MORNING OF SURGERY WITH YOUR REGULAR TOOTHPASTE  Please  do not use if you have an allergy to CHG or antibacterial soaps. If your skin becomes reddened/irritated stop using the CHG.  Do not shave (including legs and underarms) for at least 48 hours prior to first CHG shower. It is OK to shave your face.  Please follow these instructions carefully.   1. Shower the NIGHT BEFORE SURGERY and the MORNING OF SURGERY with CHG.   2. If you chose to wash your hair, wash your hair first as usual with your normal shampoo.  3. After you shampoo, rinse your hair and body thoroughly to remove the shampoo.  4. Use CHG as you would any other liquid soap. You can apply CHG directly to the skin and wash gently with a scrungie or a clean washcloth.   5. Apply the CHG Soap to your body ONLY FROM THE NECK DOWN.  Do not use on open wounds or open sores. Avoid contact with your eyes, ears, mouth and genitals (private parts). Wash Face and genitals (private parts)  with your normal soap.  6. Wash thoroughly, paying special attention to the area where your surgery will be performed.  7. Thoroughly rinse your body with warm water from the neck down.  8. DO NOT shower/wash with your normal soap after using and rinsing off the CHG Soap.  9. Pat yourself dry with a CLEAN TOWEL.  10. Wear CLEAN PAJAMAS  to bed the night before surgery, wear comfortable clothes the morning of surgery  11. Place CLEAN SHEETS on your bed the night of your first shower and DO NOT SLEEP WITH PETS.  Day of Surgery:  Do not apply any deodorants/lotions.  Please wear clean clothes to the hospital/surgery center.   Remember to brush your teeth WITH YOUR REGULAR TOOTHPASTE.   Please read over the following fact sheets that you were given.

## 2018-11-22 ENCOUNTER — Other Ambulatory Visit: Payer: Self-pay

## 2018-11-22 ENCOUNTER — Encounter (HOSPITAL_COMMUNITY)
Admission: RE | Admit: 2018-11-22 | Discharge: 2018-11-22 | Disposition: A | Discharge status: Home / Self Care | Payer: Medicare Other | Source: Ambulatory Visit | Attending: Thoracic Surgery (Cardiothoracic Vascular Surgery) | Admitting: Thoracic Surgery (Cardiothoracic Vascular Surgery)

## 2018-11-22 ENCOUNTER — Encounter (HOSPITAL_COMMUNITY): Payer: Self-pay

## 2018-11-22 DIAGNOSIS — I10 Essential (primary) hypertension: Secondary | ICD-10-CM | POA: Diagnosis not present

## 2018-11-22 DIAGNOSIS — Z01818 Encounter for other preprocedural examination: Secondary | ICD-10-CM | POA: Diagnosis not present

## 2018-11-22 DIAGNOSIS — R911 Solitary pulmonary nodule: Secondary | ICD-10-CM | POA: Diagnosis not present

## 2018-11-22 LAB — CBC
HCT: 37.1 % — ABNORMAL LOW (ref 39.0–52.0)
Hemoglobin: 11.1 g/dL — ABNORMAL LOW (ref 13.0–17.0)
MCH: 27.8 pg (ref 26.0–34.0)
MCHC: 29.9 g/dL — AB (ref 30.0–36.0)
MCV: 93 fL (ref 80.0–100.0)
Platelets: 212 10*3/uL (ref 150–400)
RBC: 3.99 MIL/uL — ABNORMAL LOW (ref 4.22–5.81)
RDW: 14.3 % (ref 11.5–15.5)
WBC: 8.8 10*3/uL (ref 4.0–10.5)
nRBC: 0 % (ref 0.0–0.2)

## 2018-11-22 LAB — ABO/RH: ABO/RH(D): A POS

## 2018-11-22 LAB — APTT: aPTT: 28 seconds (ref 24–36)

## 2018-11-22 LAB — PROTIME-INR
INR: 0.96
Prothrombin Time: 12.7 seconds (ref 11.4–15.2)

## 2018-11-22 LAB — COMPREHENSIVE METABOLIC PANEL
ALT: 14 U/L (ref 0–44)
AST: 17 U/L (ref 15–41)
Albumin: 3.1 g/dL — ABNORMAL LOW (ref 3.5–5.0)
Alkaline Phosphatase: 91 U/L (ref 38–126)
Anion gap: 10 (ref 5–15)
BUN: 21 mg/dL (ref 8–23)
CALCIUM: 8.7 mg/dL — AB (ref 8.9–10.3)
CO2: 23 mmol/L (ref 22–32)
Chloride: 105 mmol/L (ref 98–111)
Creatinine, Ser: 1.71 mg/dL — ABNORMAL HIGH (ref 0.61–1.24)
GFR calc non Af Amer: 37 mL/min — ABNORMAL LOW (ref >60)
GFR, EST AFRICAN AMERICAN: 43 mL/min — AB (ref >60)
Glucose, Bld: 95 mg/dL (ref 70–99)
Potassium: 4.7 mmol/L (ref 3.5–5.1)
Sodium: 138 mmol/L (ref 135–145)
Total Bilirubin: 0.6 mg/dL (ref 0.3–1.2)
Total Protein: 6.5 g/dL (ref 6.5–8.1)

## 2018-11-22 LAB — TYPE AND SCREEN
ABO/RH(D): A POS
Antibody Screen: NEGATIVE

## 2018-11-22 LAB — SURGICAL PCR SCREEN
MRSA, PCR: NEGATIVE
Staphylococcus aureus: NEGATIVE

## 2018-11-22 NOTE — Progress Notes (Signed)
PCP - Dr. Gar Ponto Cardiologist - Dr. Kate Sable  Chest x-ray - will need DOS EKG - 11/22/18 Stress Test - denies ECHO - 08/14/18 Cardiac Cath - denies  Sleep Study - history of OSA, does not wear CPAP  Aspirin Instructions: Patient instructed to hold all Aspirin, NSAID's, herbal medications, fish oil and vitamins 7 days prior to surgery.   Anesthesia review: cardiac history  Patient denies shortness of breath, fever, cough and chest pain at PAT appointment   Patient verbalized understanding of instructions that were given to them at the PAT appointment. Patient was also instructed that they will need to review over the PAT instructions again at home before surgery.

## 2018-11-23 LAB — BLOOD GAS, ARTERIAL
Acid-Base Excess: 1.4 mmol/L (ref 0.0–2.0)
Bicarbonate: 25.4 mmol/L (ref 20.0–28.0)
Drawn by: 449841
FIO2: 21
O2 Saturation: 94 %
Patient temperature: 98.6
pCO2 arterial: 39.4 mmHg (ref 32.0–48.0)
pH, Arterial: 7.425 (ref 7.350–7.450)
pO2, Arterial: 68.1 mmHg — ABNORMAL LOW (ref 83.0–108.0)

## 2018-11-23 NOTE — Anesthesia Preprocedure Evaluation (Addendum)
Anesthesia Evaluation  Patient identified by MRN, date of birth, ID band Patient awake    Reviewed: Allergy & Precautions, NPO status , Patient's Chart, lab work & pertinent test results  History of Anesthesia Complications (+) PONV and history of anesthetic complications  Airway Mallampati: II  TM Distance: >3 FB Neck ROM: Full    Dental  (+) Dental Advisory Given   Pulmonary sleep apnea (noncompliant) , COPD, former smoker,    breath sounds clear to auscultation       Cardiovascular hypertension, Pt. on home beta blockers and Pt. on medications  Rhythm:Regular Rate:Normal   '19 TTE - mild LVH. EF 60% to 65%. Grade 1 diastolic dysfunction. Septal motion showed abnormal function and dyssynergy. These changes are consistent with intraventricular conduction delay. Mild AI and PR.  thoracic aortic aneurysm - at 4.6 cm by imaging in 07/2018    Neuro/Psych negative neurological ROS  negative psych ROS   GI/Hepatic negative GI ROS, Neg liver ROS,   Endo/Other   Obesity   Renal/GU CRFRenal disease     Musculoskeletal negative musculoskeletal ROS (+)   Abdominal   Peds  Hematology  (+) anemia ,   Anesthesia Other Findings   Reproductive/Obstetrics                           Anesthesia Physical Anesthesia Plan  ASA: III  Anesthesia Plan: General   Post-op Pain Management:    Induction: Intravenous  PONV Risk Score and Plan: 4 or greater and Treatment may vary due to age or medical condition, Ondansetron, Dexamethasone and Propofol infusion  Airway Management Planned: Oral ETT and Double Lumen EBT  Additional Equipment: Arterial line, CVP and Ultrasound Guidance Line Placement  Intra-op Plan:   Post-operative Plan: Possible Post-op intubation/ventilation  Informed Consent: I have reviewed the patients History and Physical, chart, labs and discussed the procedure including the risks,  benefits and alternatives for the proposed anesthesia with the patient or authorized representative who has indicated his/her understanding and acceptance.     Dental advisory given  Plan Discussed with: CRNA and Anesthesiologist  Anesthesia Plan Comments: ( )      Anesthesia Quick Evaluation

## 2018-11-23 NOTE — Progress Notes (Signed)
Anesthesia Chart Review:  Case:  [id number] Date/Time:  11/26/18 0715   Procedures:      VIDEO ASSISTED THORACOSCOPY (VATS)/WEDGE RESECTION (Left Chest)     possible SEGMENTECTOMY (Left )     possible LOBECTOMY (Left )   Anesthesia type:  General   Pre-op diagnosis:  LUL NODULE   Location:  [facility] OR ROOM 10 / [facility]   Surgeon:  Melrose Nakayama, MD      DISCUSSION: 78 yo male former smoker. Pertinent hx includes PONV, thoracic aortic aneurysm (at 4.6 cm by imaging in 07/2018), coronary artery calcifications by prior CT, HTN, HLD, Stage 3 CKD, OSA noncompliant on CPAP, s/p right hemicolectomy at [facility] 09/2018  He has been followed since 2015 for an ascending aneurysm.  In 2019 he had a CT to reassess the aneurysm.  It was stable but he had a left upper lobe lung nodule that had increased in size from 3 mm to 12 mm. PET CT which showed the nodule was hypermetabolic.  He also had a hypermetabolic lesion in his cecum. The cecal lesion turned out to be a colon cancer.  He had a resection for that in December at [facility].  Apparently had an ileus postop and was in the hospital for about 2 weeks. He had a difficult recovery.  He was seen by cardiology on 11/20/2018. Discussed upcoming wedge resection and possible segmentectomy or lobectomy on 11/26/2018. He had issues with fluid retention following hospital admission during which he received IVF post-operatively. He was started on Lasix with significant improvement. Per cardiology OV note 2/18: "In talking with the patient today, he reports overall doing well from a cardiac perspective since his last office visit. Says that his breathing significantly improved within a few days of starting Lasix and he denies any recurrent orthopnea or lower extremity edema. No recent chest pain, palpitations, or dyspnea on exertion. He does walk with a cane but mostly for balance issues." He was advised to monitor weight closely with his upcoming  surgery.  Recent echocardiogram in 08/2018 showed a preserved EF of 60 to 65% with no wall motion abnormalities.  Pt currently asymptomatic from CV standpoint and has recently been followed closely by cardiology. Anticipate he can proceed as planned barring acute status change.  VS: BP 119/60   Pulse 71   Temp (!) 36.4 C (Oral)   Resp 20   Ht 5\' 7"  (1.702 m)   Wt 95.2 kg   SpO2 97%   BMI 32.87 kg/m   PROVIDERS: Caryl Bis, MD is PCP  Kate Sable, MD is Cardiologist  LABS: Elevated creatinine noted. C/w with pt's CKD.  (all labs ordered are listed, but only abnormal results are displayed)  Labs Reviewed  BLOOD GAS, ARTERIAL - Abnormal; Notable for the following components:      Result Value   pO2, Arterial 68.1 (*)    All other components within normal limits  CBC - Abnormal; Notable for the following components:   RBC 3.99 (*)    Hemoglobin 11.1 (*)    HCT 37.1 (*)    MCHC 29.9 (*)    All other components within normal limits  COMPREHENSIVE METABOLIC PANEL - Abnormal; Notable for the following components:   Creatinine, Ser 1.71 (*)    Calcium 8.7 (*)    Albumin 3.1 (*)    GFR calc non Af Amer 37 (*)    GFR calc Af Amer 43 (*)    All other components within  normal limits  SURGICAL PCR SCREEN  APTT  PROTIME-INR  TYPE AND SCREEN  ABO/RH     IMAGES: CT Chest 11/16/2018: IMPRESSION: 1. The spiculated left upper lobe pulmonary nodule has enlarged from the previous studies, consistent with progressive primary lung cancer. No new nodules identified. 2. No thoracic adenopathy. 3. Possible new low-density lesion in the dome of the right hepatic lobe. Given the interval from previous imaging, a small metastasis can not be completely excluded (especially if the patient has colon cancer). 4. Aortic Atherosclerosis (ICD10-I70.0). Stable dilatation of the ascending aorta. 5.  Emphysema (ICD10-J43.9).   EKG: 11/22/2018: Sinus rhythm with Premature atrial  complexes. Rate 69. Right bundle branch block. RBBB also present on tracing 07/12/2018.  CV: TTE 08/14/2018: Study Conclusions  - Left ventricle: The cavity size was normal. Wall thickness was   increased in a pattern of mild LVH. Systolic function was normal.   The estimated ejection fraction was in the range of 60% to 65%.   Wall motion was normal; there were no regional wall motion   abnormalities. Doppler parameters are consistent with abnormal   left ventricular relaxation (grade 1 diastolic dysfunction).   Doppler parameters are consistent with indeterminate ventricular   filling pressure. - Ventricular septum: Septal motion showed abnormal function and   dyssynergy. These changes are consistent with intraventricular   conduction delay. - Aortic valve: There was mild regurgitation. - Pulmonic valve: There was mild regurgitation.  Past Medical History:  Diagnosis Date  . Bladder neck contracture   . Complication of anesthesia   . COPD (chronic obstructive pulmonary disease) ([place]) PER CXRAY  10-28-2010  . Frequency of urination   . History of prostate cancer S/P SEED IMPLANTS   . History of urethral stricture   . History of urinary retention   . Nocturia   . OSA (obstructive sleep apnea) NON-COMPLIANT   does not use cpap  . Peyronie disease   . PONV (postoperative nausea and vomiting)   . Urinary incontinence     Past Surgical History:  Procedure Laterality Date  . [place] VITRECTOMY WITH 20 GAUGE MVR PORT Right 12/11/2012   Procedure: 25 GAUGE PARS PLANA VITRECTOMY WITH 20 GAUGE MVR PORT;  Surgeon: Hayden Pedro, MD;  Location: [place];  Service: Ophthalmology;  Laterality: Right;  Injection of silicone Oil  . CATARACT EXTRACTION W/ INTRAOCULAR LENS  IMPLANT, BILATERAL    . CYSTO/ URETHRAL DILATION  04-21-2008  . CYSTO/ URETHRAL DILATION/  URETHRA BALLOON DILATION / VAPORIZATION OF PROSTATE AND URETHRAL STRICTURE  10-28-2010  . CYSTOSCOPY  01/27/2012    Procedure: CYSTOSCOPY;  Surgeon: Ailene Rud, MD;  Location: [facility];  Service: Urology;  Laterality: N/A;  cysto, cook balloon dilation of  bladder neck contracture gyrus button vaporization  carm   . EYE SURGERY     cataract removed bilaterally  . GAS INSERTION  10/23/2012   Procedure: INSERTION OF GAS;  Surgeon: Hayden Pedro, MD;  Location: [place];  Service: Ophthalmology;  Laterality: Right;  C3F8  . GAS/FLUID EXCHANGE Right 12/11/2012   Procedure: GAS/FLUID EXCHANGE;  Surgeon: Hayden Pedro, MD;  Location: [place];  Service: Ophthalmology;  Laterality: Right;  . IRIDECTOMY Right 12/11/2012   Procedure: IRIDECTOMY;  Surgeon: Hayden Pedro, MD;  Location: [place];  Service: Ophthalmology;  Laterality: Right;  . LASER PHOTO ABLATION Right 12/11/2012   Procedure: LASER PHOTO ABLATION;  Surgeon: Hayden Pedro, MD;  Location: [place];  Service: Ophthalmology;  Laterality: Right;  . MEMBRANE PEEL Right 12/11/2012   Procedure: MEMBRANE PEEL;  Surgeon: Hayden Pedro, MD;  Location: [place];  Service: Ophthalmology;  Laterality: Right;  . [place] INJECTION Right 12/11/2012   Procedure: PERFLUORONE INJECTION;  Surgeon: Hayden Pedro, MD;  Location: [place];  Service: Ophthalmology;  Laterality: Right;  Injection and Removal  . PHOTOCOAGULATION WITH LASER  10/23/2012   Procedure: PHOTOCOAGULATION WITH LASER;  Surgeon: Hayden Pedro, MD;  Location: [place];  Service: Ophthalmology;  Laterality: Right;  . [place] INJECTION  2003  . RADIOACTIVE SEED IMPLANTS, PROSTATE  06-20-2002  . SCLERAL BUCKLE  10/23/2012   Procedure: SCLERAL BUCKLE;  Surgeon: Hayden Pedro, MD;  Location: [place];  Service: Ophthalmology;  Laterality: Right;  . TRANSURETHRAL RESECTION OF BLADDER TUMOR  05/15/2012   Procedure: TRANSURETHRAL RESECTION OF BLADDER TUMOR (TURBT);  Surgeon: Ailene Rud, MD;  Location: [facility];  Service: Urology;  Laterality: N/A;    MEDICATIONS: . amLODipine  (NORVASC) 5 MG tablet  . aspirin EC 81 MG tablet  . atorvastatin (LIPITOR) 40 MG tablet  . furosemide (LASIX) 40 MG tablet  . ibuprofen (ADVIL,MOTRIN) 200 MG tablet  . metoprolol tartrate (LOPRESSOR) 25 MG tablet  . potassium chloride SA (K-DUR,KLOR-CON) 20 MEQ tablet   No current facility-administered medications for this encounter.    Ej, [facility] Short Stay Center/Anesthesiology Phone [phone] 11/23/2018 9:11 AM

## 2018-11-26 ENCOUNTER — Inpatient Hospital Stay (HOSPITAL_COMMUNITY): Payer: Medicare Other | Admitting: Anesthesiology

## 2018-11-26 ENCOUNTER — Other Ambulatory Visit: Payer: Self-pay

## 2018-11-26 ENCOUNTER — Inpatient Hospital Stay (HOSPITAL_COMMUNITY)
Admission: RE | Admit: 2018-11-26 | Discharge: 2018-12-03 | DRG: 164 | Disposition: A | Discharge status: Home / Self Care | Payer: Medicare Other | Attending: Thoracic Surgery (Cardiothoracic Vascular Surgery) | Admitting: Thoracic Surgery (Cardiothoracic Vascular Surgery)

## 2018-11-26 ENCOUNTER — Inpatient Hospital Stay (HOSPITAL_COMMUNITY): Payer: Medicare Other | Admitting: Physician Assistant

## 2018-11-26 ENCOUNTER — Inpatient Hospital Stay (HOSPITAL_COMMUNITY): Payer: Medicare Other

## 2018-11-26 ENCOUNTER — Encounter (HOSPITAL_COMMUNITY)
Admission: RE | Disposition: A | Discharge status: Home / Self Care | Payer: Self-pay | Source: Home / Self Care | Attending: Thoracic Surgery (Cardiothoracic Vascular Surgery)

## 2018-11-26 DIAGNOSIS — Z9119 Patient's noncompliance with other medical treatment and regimen: Secondary | ICD-10-CM

## 2018-11-26 DIAGNOSIS — J9382 Other air leak: Secondary | ICD-10-CM | POA: Diagnosis not present

## 2018-11-26 DIAGNOSIS — J439 Emphysema, unspecified: Secondary | ICD-10-CM | POA: Diagnosis present

## 2018-11-26 DIAGNOSIS — Z8546 Personal history of malignant neoplasm of prostate: Secondary | ICD-10-CM

## 2018-11-26 DIAGNOSIS — J9811 Atelectasis: Secondary | ICD-10-CM | POA: Diagnosis not present

## 2018-11-26 DIAGNOSIS — I712 Thoracic aortic aneurysm, without rupture: Secondary | ICD-10-CM | POA: Diagnosis present

## 2018-11-26 DIAGNOSIS — Z9842 Cataract extraction status, left eye: Secondary | ICD-10-CM

## 2018-11-26 DIAGNOSIS — Z79899 Other long term (current) drug therapy: Secondary | ICD-10-CM

## 2018-11-26 DIAGNOSIS — J948 Other specified pleural conditions: Secondary | ICD-10-CM | POA: Diagnosis present

## 2018-11-26 DIAGNOSIS — J939 Pneumothorax, unspecified: Secondary | ICD-10-CM | POA: Diagnosis not present

## 2018-11-26 DIAGNOSIS — Z7982 Long term (current) use of aspirin: Secondary | ICD-10-CM | POA: Diagnosis not present

## 2018-11-26 DIAGNOSIS — C189 Malignant neoplasm of colon, unspecified: Secondary | ICD-10-CM | POA: Diagnosis present

## 2018-11-26 DIAGNOSIS — R918 Other nonspecific abnormal finding of lung field: Secondary | ICD-10-CM | POA: Diagnosis not present

## 2018-11-26 DIAGNOSIS — I10 Essential (primary) hypertension: Secondary | ICD-10-CM | POA: Diagnosis present

## 2018-11-26 DIAGNOSIS — Z87891 Personal history of nicotine dependence: Secondary | ICD-10-CM | POA: Diagnosis not present

## 2018-11-26 DIAGNOSIS — G4733 Obstructive sleep apnea (adult) (pediatric): Secondary | ICD-10-CM | POA: Diagnosis present

## 2018-11-26 DIAGNOSIS — D649 Anemia, unspecified: Secondary | ICD-10-CM | POA: Diagnosis present

## 2018-11-26 DIAGNOSIS — Z4682 Encounter for fitting and adjustment of non-vascular catheter: Secondary | ICD-10-CM | POA: Diagnosis not present

## 2018-11-26 DIAGNOSIS — I454 Nonspecific intraventricular block: Secondary | ICD-10-CM | POA: Diagnosis present

## 2018-11-26 DIAGNOSIS — E785 Hyperlipidemia, unspecified: Secondary | ICD-10-CM | POA: Diagnosis present

## 2018-11-26 DIAGNOSIS — R911 Solitary pulmonary nodule: Secondary | ICD-10-CM

## 2018-11-26 DIAGNOSIS — Z9841 Cataract extraction status, right eye: Secondary | ICD-10-CM | POA: Diagnosis not present

## 2018-11-26 DIAGNOSIS — C3412 Malignant neoplasm of upper lobe, left bronchus or lung: Principal | ICD-10-CM | POA: Diagnosis present

## 2018-11-26 DIAGNOSIS — Z961 Presence of intraocular lens: Secondary | ICD-10-CM | POA: Diagnosis present

## 2018-11-26 DIAGNOSIS — R0989 Other specified symptoms and signs involving the circulatory and respiratory systems: Secondary | ICD-10-CM | POA: Diagnosis not present

## 2018-11-26 DIAGNOSIS — I517 Cardiomegaly: Secondary | ICD-10-CM | POA: Diagnosis not present

## 2018-11-26 DIAGNOSIS — Z902 Acquired absence of lung [part of]: Secondary | ICD-10-CM

## 2018-11-26 HISTORY — PX: CHEST TUBE INSERTION: SHX231

## 2018-11-26 HISTORY — PX: VIDEO ASSISTED THORACOSCOPY (VATS)/WEDGE RESECTION: SHX6174

## 2018-11-26 LAB — URINALYSIS, ROUTINE W REFLEX MICROSCOPIC
Bilirubin Urine: NEGATIVE
Glucose, UA: NEGATIVE mg/dL
Hgb urine dipstick: NEGATIVE
Ketones, ur: NEGATIVE mg/dL
Leukocytes,Ua: NEGATIVE
Nitrite: POSITIVE — AB
Protein, ur: NEGATIVE mg/dL
Specific Gravity, Urine: 1.008 (ref 1.005–1.030)
pH: 9 — ABNORMAL HIGH (ref 5.0–8.0)

## 2018-11-26 SURGERY — VIDEO ASSISTED THORACOSCOPY (VATS)/WEDGE RESECTION
Anesthesia: General | Site: Chest | Laterality: Left

## 2018-11-26 MED ORDER — LEVALBUTEROL HCL 0.63 MG/3ML IN NEBU: 0.6300 mg | INHALATION_SOLUTION | Freq: Four times a day (QID) | RESPIRATORY_TRACT | Status: DC | PRN

## 2018-11-26 MED ORDER — LIDOCAINE 2% (20 MG/ML) 5 ML SYRINGE
INTRAMUSCULAR | Status: AC
  Filled 2018-11-26: qty 5

## 2018-11-26 MED ORDER — CEFAZOLIN SODIUM-DEXTROSE 2-4 GM/100ML-% IV SOLN
INTRAVENOUS | Status: AC
  Filled 2018-11-26: qty 100

## 2018-11-26 MED ORDER — BISACODYL 5 MG PO TBEC
10.0000 mg | DELAYED_RELEASE_TABLET | Freq: Every day | ORAL | Status: DC
  Administered 2018-11-27 – 2018-12-01 (×5): 10 mg via ORAL
  Filled 2018-11-26 (×6): qty 2

## 2018-11-26 MED ORDER — MORPHINE SULFATE 2 MG/ML IV SOLN
INTRAVENOUS | Status: DC
  Administered 2018-11-26: 15:00:00 via INTRAVENOUS
  Filled 2018-11-26: qty 50
  Filled 2018-11-26: qty 30

## 2018-11-26 MED ORDER — HEMOSTATIC AGENTS (NO CHARGE) OPTIME
TOPICAL | Status: DC | PRN
  Administered 2018-11-26: 1 via TOPICAL

## 2018-11-26 MED ORDER — LACTATED RINGERS IV SOLN
INTRAVENOUS | Status: DC | PRN
  Administered 2018-11-26: 07:00:00 via INTRAVENOUS

## 2018-11-26 MED ORDER — NALOXONE HCL 0.4 MG/ML IJ SOLN: 0.4000 mg | INTRAMUSCULAR | Status: DC | PRN

## 2018-11-26 MED ORDER — OXYCODONE HCL 5 MG/5ML PO SOLN: 5.0000 mg | Freq: Once | ORAL | Status: DC | PRN

## 2018-11-26 MED ORDER — TRAMADOL HCL 50 MG PO TABS
50.0000 mg | ORAL_TABLET | Freq: Four times a day (QID) | ORAL | Status: DC | PRN
  Administered 2018-11-28 (×2): 100 mg via ORAL
  Filled 2018-11-26 (×2): qty 2

## 2018-11-26 MED ORDER — ACETAMINOPHEN 10 MG/ML IV SOLN
INTRAVENOUS | Status: DC | PRN
  Administered 2018-11-26: 1000 mg via INTRAVENOUS

## 2018-11-26 MED ORDER — OXYCODONE HCL 5 MG PO TABS: 5.0000 mg | ORAL_TABLET | Freq: Once | ORAL | Status: DC | PRN

## 2018-11-26 MED ORDER — ROCURONIUM BROMIDE 50 MG/5ML IV SOSY
PREFILLED_SYRINGE | INTRAVENOUS | Status: AC
  Filled 2018-11-26: qty 5

## 2018-11-26 MED ORDER — ENOXAPARIN SODIUM 40 MG/0.4ML ~~LOC~~ SOLN
40.0000 mg | SUBCUTANEOUS | Status: DC
  Administered 2018-11-26 – 2018-12-02 (×7): 40 mg via SUBCUTANEOUS
  Filled 2018-11-26 (×7): qty 0.4

## 2018-11-26 MED ORDER — ONDANSETRON HCL 4 MG/2ML IJ SOLN
INTRAMUSCULAR | Status: DC | PRN
  Administered 2018-11-26: 4 mg via INTRAVENOUS

## 2018-11-26 MED ORDER — CEFAZOLIN SODIUM-DEXTROSE 2-4 GM/100ML-% IV SOLN
2.0000 g | Freq: Three times a day (TID) | INTRAVENOUS | Status: AC
  Administered 2018-11-26 – 2018-11-27 (×2): 2 g via INTRAVENOUS
  Filled 2018-11-26 (×3): qty 100

## 2018-11-26 MED ORDER — SUGAMMADEX SODIUM 200 MG/2ML IV SOLN
INTRAVENOUS | Status: DC | PRN
  Administered 2018-11-26 (×2): 100 mg via INTRAVENOUS

## 2018-11-26 MED ORDER — ONDANSETRON HCL 4 MG/2ML IJ SOLN: 4.0000 mg | Freq: Once | INTRAMUSCULAR | Status: DC | PRN

## 2018-11-26 MED ORDER — POTASSIUM CHLORIDE CRYS ER 20 MEQ PO TBCR
20.0000 meq | EXTENDED_RELEASE_TABLET | Freq: Every day | ORAL | Status: DC
  Administered 2018-11-27 – 2018-11-28 (×2): 20 meq via ORAL
  Filled 2018-11-26 (×2): qty 1

## 2018-11-26 MED ORDER — BUPIVACAINE HCL (PF) 0.5 % IJ SOLN
INTRAMUSCULAR | Status: AC
  Filled 2018-11-26: qty 30

## 2018-11-26 MED ORDER — OXYCODONE HCL 5 MG PO TABS: 5.0000 mg | ORAL_TABLET | ORAL | Status: DC | PRN

## 2018-11-26 MED ORDER — ATORVASTATIN CALCIUM 40 MG PO TABS
40.0000 mg | ORAL_TABLET | Freq: Every evening | ORAL | Status: DC
  Administered 2018-11-26 – 2018-12-02 (×7): 40 mg via ORAL
  Filled 2018-11-26 (×6): qty 1

## 2018-11-26 MED ORDER — LIDOCAINE 2% (20 MG/ML) 5 ML SYRINGE
INTRAMUSCULAR | Status: DC | PRN
  Administered 2018-11-26: 60 mg via INTRAVENOUS

## 2018-11-26 MED ORDER — BUPIVACAINE LIPOSOME 1.3 % IJ SUSP
20.0000 mL | INTRAMUSCULAR | Status: DC
  Filled 2018-11-26: qty 20

## 2018-11-26 MED ORDER — FUROSEMIDE 40 MG PO TABS
40.0000 mg | ORAL_TABLET | Freq: Every day | ORAL | Status: DC
  Administered 2018-11-27: 40 mg via ORAL
  Filled 2018-11-26: qty 1

## 2018-11-26 MED ORDER — ASPIRIN EC 81 MG PO TBEC
81.0000 mg | DELAYED_RELEASE_TABLET | ORAL | Status: DC
  Administered 2018-11-26 – 2018-12-02 (×4): 81 mg via ORAL
  Filled 2018-11-26 (×7): qty 1

## 2018-11-26 MED ORDER — FENTANYL CITRATE (PF) 100 MCG/2ML IJ SOLN: 25.0000 ug | INTRAMUSCULAR | Status: DC | PRN

## 2018-11-26 MED ORDER — DEXAMETHASONE SODIUM PHOSPHATE 10 MG/ML IJ SOLN
INTRAMUSCULAR | Status: AC
  Filled 2018-11-26: qty 1

## 2018-11-26 MED ORDER — DIPHENHYDRAMINE HCL 50 MG/ML IJ SOLN: 12.5000 mg | Freq: Four times a day (QID) | INTRAMUSCULAR | Status: DC | PRN

## 2018-11-26 MED ORDER — SENNOSIDES-DOCUSATE SODIUM 8.6-50 MG PO TABS
1.0000 | ORAL_TABLET | Freq: Every day | ORAL | Status: DC
  Administered 2018-11-27 – 2018-12-01 (×4): 1 via ORAL
  Filled 2018-11-26 (×6): qty 1

## 2018-11-26 MED ORDER — METOPROLOL TARTRATE 12.5 MG HALF TABLET
12.5000 mg | ORAL_TABLET | Freq: Once | ORAL | Status: AC
  Administered 2018-11-26: 12.5 mg via ORAL

## 2018-11-26 MED ORDER — ONDANSETRON HCL 4 MG/2ML IJ SOLN: 4.0000 mg | Freq: Four times a day (QID) | INTRAMUSCULAR | Status: DC | PRN

## 2018-11-26 MED ORDER — AMLODIPINE BESYLATE 5 MG PO TABS
5.0000 mg | ORAL_TABLET | Freq: Every day | ORAL | Status: DC
  Administered 2018-11-27 – 2018-12-03 (×7): 5 mg via ORAL
  Filled 2018-11-26 (×7): qty 1

## 2018-11-26 MED ORDER — METOCLOPRAMIDE HCL 5 MG/ML IJ SOLN
10.0000 mg | Freq: Four times a day (QID) | INTRAMUSCULAR | Status: AC
  Administered 2018-11-26 – 2018-11-27 (×4): 10 mg via INTRAVENOUS
  Filled 2018-11-26 (×4): qty 2

## 2018-11-26 MED ORDER — 0.9 % SODIUM CHLORIDE (POUR BTL) OPTIME
TOPICAL | Status: DC | PRN
  Administered 2018-11-26: 3000 mL

## 2018-11-26 MED ORDER — ONDANSETRON HCL 4 MG/2ML IJ SOLN
INTRAMUSCULAR | Status: AC
  Filled 2018-11-26: qty 2

## 2018-11-26 MED ORDER — SODIUM CHLORIDE 0.9% FLUSH: 9.0000 mL | INTRAVENOUS | Status: DC | PRN

## 2018-11-26 MED ORDER — LEVALBUTEROL HCL 0.63 MG/3ML IN NEBU
0.6300 mg | INHALATION_SOLUTION | Freq: Four times a day (QID) | RESPIRATORY_TRACT | Status: DC
  Administered 2018-11-26: 0.63 mg via RESPIRATORY_TRACT
  Filled 2018-11-26: qty 3

## 2018-11-26 MED ORDER — PHENYLEPHRINE 40 MCG/ML (10ML) SYRINGE FOR IV PUSH (FOR BLOOD PRESSURE SUPPORT)
PREFILLED_SYRINGE | INTRAVENOUS | Status: AC
  Filled 2018-11-26: qty 10

## 2018-11-26 MED ORDER — DEXTROSE-NACL 5-0.9 % IV SOLN
INTRAVENOUS | Status: DC
  Administered 2018-11-26 – 2018-11-27 (×3): via INTRAVENOUS

## 2018-11-26 MED ORDER — ACETAMINOPHEN 160 MG/5ML PO SOLN
1000.0000 mg | Freq: Four times a day (QID) | ORAL | Status: AC
  Administered 2018-11-26: 1000 mg via ORAL
  Filled 2018-11-26 (×2): qty 40.6

## 2018-11-26 MED ORDER — FENTANYL CITRATE (PF) 100 MCG/2ML IJ SOLN
INTRAMUSCULAR | Status: DC | PRN
  Administered 2018-11-26 (×2): 50 ug via INTRAVENOUS
  Administered 2018-11-26: 100 ug via INTRAVENOUS
  Administered 2018-11-26: 50 ug via INTRAVENOUS
  Administered 2018-11-26: 100 ug via INTRAVENOUS

## 2018-11-26 MED ORDER — SODIUM CHLORIDE (PF) 0.9 % IJ SOLN
INTRAMUSCULAR | Status: DC | PRN
  Administered 2018-11-26: 50 mL via INTRAVENOUS

## 2018-11-26 MED ORDER — METOPROLOL TARTRATE 12.5 MG HALF TABLET
ORAL_TABLET | ORAL | Status: AC
  Administered 2018-11-26: 12.5 mg via ORAL
  Filled 2018-11-26: qty 1

## 2018-11-26 MED ORDER — DEXAMETHASONE SODIUM PHOSPHATE 10 MG/ML IJ SOLN
INTRAMUSCULAR | Status: DC | PRN
  Administered 2018-11-26: 10 mg via INTRAVENOUS

## 2018-11-26 MED ORDER — PROPOFOL 10 MG/ML IV BOLUS
INTRAVENOUS | Status: DC | PRN
  Administered 2018-11-26: 110 mg via INTRAVENOUS

## 2018-11-26 MED ORDER — PHENYLEPHRINE 40 MCG/ML (10ML) SYRINGE FOR IV PUSH (FOR BLOOD PRESSURE SUPPORT)
PREFILLED_SYRINGE | INTRAVENOUS | Status: DC | PRN
  Administered 2018-11-26: 80 ug via INTRAVENOUS

## 2018-11-26 MED ORDER — FENTANYL CITRATE (PF) 250 MCG/5ML IJ SOLN
INTRAMUSCULAR | Status: AC
  Filled 2018-11-26: qty 5

## 2018-11-26 MED ORDER — BUPIVACAINE LIPOSOME 1.3 % IJ SUSP
INTRAMUSCULAR | Status: DC | PRN
  Administered 2018-11-26: 08:00:00

## 2018-11-26 MED ORDER — PROPOFOL 10 MG/ML IV BOLUS
INTRAVENOUS | Status: AC
  Filled 2018-11-26: qty 20

## 2018-11-26 MED ORDER — ACETAMINOPHEN 500 MG PO TABS
1000.0000 mg | ORAL_TABLET | Freq: Four times a day (QID) | ORAL | Status: AC
  Administered 2018-11-27 – 2018-12-01 (×14): 1000 mg via ORAL
  Filled 2018-11-26 (×16): qty 2

## 2018-11-26 MED ORDER — SODIUM CHLORIDE 0.9 % IV SOLN
INTRAVENOUS | Status: DC | PRN
  Administered 2018-11-26: 20 ug/min via INTRAVENOUS

## 2018-11-26 MED ORDER — CEFAZOLIN SODIUM-DEXTROSE 2-4 GM/100ML-% IV SOLN
2.0000 g | INTRAVENOUS | Status: AC
  Administered 2018-11-26: 2 g via INTRAVENOUS

## 2018-11-26 MED ORDER — METOPROLOL TARTRATE 12.5 MG HALF TABLET
12.5000 mg | ORAL_TABLET | Freq: Every evening | ORAL | Status: DC
  Administered 2018-11-26 – 2018-12-02 (×7): 12.5 mg via ORAL
  Filled 2018-11-26 (×7): qty 1

## 2018-11-26 MED ORDER — ROCURONIUM BROMIDE 50 MG/5ML IV SOSY
PREFILLED_SYRINGE | INTRAVENOUS | Status: DC | PRN
  Administered 2018-11-26 (×2): 50 mg via INTRAVENOUS

## 2018-11-26 MED ORDER — POTASSIUM CHLORIDE 10 MEQ/50ML IV SOLN: 10.0000 meq | Freq: Every day | INTRAVENOUS | Status: DC | PRN

## 2018-11-26 MED ORDER — ACETAMINOPHEN 10 MG/ML IV SOLN
INTRAVENOUS | Status: AC
  Filled 2018-11-26: qty 100

## 2018-11-26 MED ORDER — DIPHENHYDRAMINE HCL 12.5 MG/5ML PO ELIX
12.5000 mg | ORAL_SOLUTION | Freq: Four times a day (QID) | ORAL | Status: DC | PRN
  Filled 2018-11-26: qty 5

## 2018-11-26 SURGICAL SUPPLY — 95 items
ADH SKN CLS APL DERMABOND .7 (GAUZE/BANDAGES/DRESSINGS) ×2
ADH SKN CLS LQ APL DERMABOND (GAUZE/BANDAGES/DRESSINGS) ×2
APL SWBSTK 6 STRL LF DISP (MISCELLANEOUS) ×2
APPLICATOR COTTON TIP 6 STRL (MISCELLANEOUS) IMPLANT
APPLICATOR COTTON TIP 6IN STRL (MISCELLANEOUS) ×4
BAG SPEC RTRVL LRG 6X4 10 (ENDOMECHANICALS) ×2
CANISTER SUCT 3000ML PPV (MISCELLANEOUS) ×8 IMPLANT
CATH FOLEY LATEX FREE 14FR (CATHETERS) ×4
CATH FOLEY LF 14FR (CATHETERS) IMPLANT
CATH THORACIC 28FR (CATHETERS) ×2 IMPLANT
CATH THORACIC 36FR (CATHETERS) IMPLANT
CATH THORACIC 36FR RT ANG (CATHETERS) IMPLANT
CLIP VESOCCLUDE MED 6/CT (CLIP) ×4 IMPLANT
CONN ST 1/4X3/8  BEN (MISCELLANEOUS)
CONN ST 1/4X3/8 BEN (MISCELLANEOUS) IMPLANT
CONN Y 3/8X3/8X3/8  BEN (MISCELLANEOUS)
CONN Y 3/8X3/8X3/8 BEN (MISCELLANEOUS) IMPLANT
CONT SPEC 4OZ CLIKSEAL STRL BL (MISCELLANEOUS) ×8 IMPLANT
COVER SURGICAL LIGHT HANDLE (MISCELLANEOUS) ×4 IMPLANT
COVER WAND RF STERILE (DRAPES) ×4 IMPLANT
DERMABOND ADHESIVE PROPEN (GAUZE/BANDAGES/DRESSINGS) ×2
DERMABOND ADVANCED (GAUZE/BANDAGES/DRESSINGS) ×2
DERMABOND ADVANCED .7 DNX12 (GAUZE/BANDAGES/DRESSINGS) ×2 IMPLANT
DERMABOND ADVANCED .7 DNX6 (GAUZE/BANDAGES/DRESSINGS) IMPLANT
DRAIN CHANNEL 28F RND 3/8 FF (WOUND CARE) IMPLANT
DRAIN CHANNEL 32F RND 10.7 FF (WOUND CARE) IMPLANT
DRAPE CV SPLIT W-CLR ANES SCRN (DRAPES) ×4 IMPLANT
DRAPE ORTHO SPLIT 77X108 STRL (DRAPES) ×4
DRAPE SURG ORHT 6 SPLT 77X108 (DRAPES) ×2 IMPLANT
DRAPE WARM FLUID 44X44 (DRAPE) ×4 IMPLANT
ELECT BLADE 6.5 EXT (BLADE) ×4 IMPLANT
ELECT REM PT RETURN 9FT ADLT (ELECTROSURGICAL) ×4
ELECTRODE REM PT RTRN 9FT ADLT (ELECTROSURGICAL) ×2 IMPLANT
GAUZE SPONGE 4X4 12PLY STRL (GAUZE/BANDAGES/DRESSINGS) IMPLANT
GAUZE SPONGE 4X4 12PLY STRL LF (GAUZE/BANDAGES/DRESSINGS) ×2 IMPLANT
GLOVE SURG SIGNA 7.5 PF LTX (GLOVE) ×8 IMPLANT
GOWN STRL REUS W/ TWL LRG LVL3 (GOWN DISPOSABLE) ×4 IMPLANT
GOWN STRL REUS W/ TWL XL LVL3 (GOWN DISPOSABLE) ×4 IMPLANT
GOWN STRL REUS W/TWL LRG LVL3 (GOWN DISPOSABLE) ×8
GOWN STRL REUS W/TWL XL LVL3 (GOWN DISPOSABLE) ×8
HEMOSTAT SURGICEL 2X14 (HEMOSTASIS) IMPLANT
KIT BASIN OR (CUSTOM PROCEDURE TRAY) ×4 IMPLANT
KIT SUCTION CATH 14FR (SUCTIONS) ×4 IMPLANT
KIT TURNOVER KIT B (KITS) ×4 IMPLANT
NDL SPNL 18GX3.5 QUINCKE PK (NEEDLE) IMPLANT
NEEDLE SPNL 18GX3.5 QUINCKE PK (NEEDLE) IMPLANT
NS IRRIG 1000ML POUR BTL (IV SOLUTION) ×12 IMPLANT
PACK CHEST (CUSTOM PROCEDURE TRAY) ×4 IMPLANT
PAD ARMBOARD 7.5X6 YLW CONV (MISCELLANEOUS) ×8 IMPLANT
POUCH ENDO CATCH II 15MM (MISCELLANEOUS) IMPLANT
POUCH SPECIMEN RETRIEVAL 10MM (ENDOMECHANICALS) ×2 IMPLANT
RELOAD STAPLE 60 3.8 GOLD REG (STAPLE) IMPLANT
RELOAD STAPLER GOLD 60MM (STAPLE) ×10 IMPLANT
SCISSORS ENDO CVD 5DCS (MISCELLANEOUS) IMPLANT
SEALANT PROGEL (MISCELLANEOUS) IMPLANT
SEALANT SURG COSEAL 4ML (VASCULAR PRODUCTS) IMPLANT
SEALANT SURG COSEAL 8ML (VASCULAR PRODUCTS) IMPLANT
SHEARS HARMONIC HDI 20CM (ELECTROSURGICAL) IMPLANT
SOLUTION ANTI FOG 6CC (MISCELLANEOUS) ×4 IMPLANT
SPECIMEN JAR MEDIUM (MISCELLANEOUS) ×4 IMPLANT
SPONGE INTESTINAL PEANUT (DISPOSABLE) ×4 IMPLANT
SPONGE TONSIL TAPE 1 RFD (DISPOSABLE) ×4 IMPLANT
STAPLE ECHEON FLEX 60 POW ENDO (STAPLE) ×2 IMPLANT
STAPLER RELOAD GOLD 60MM (STAPLE) ×20
SUT PROLENE 4 0 RB 1 (SUTURE) ×4
SUT PROLENE 4-0 RB1 .5 CRCL 36 (SUTURE) IMPLANT
SUT SILK  1 MH (SUTURE) ×4
SUT SILK 1 MH (SUTURE) ×4 IMPLANT
SUT SILK 1 TIES 10X30 (SUTURE) ×4 IMPLANT
SUT SILK 2 0 SH (SUTURE) IMPLANT
SUT SILK 2 0SH CR/8 30 (SUTURE) ×2 IMPLANT
SUT SILK 3 0 SH 30 (SUTURE) IMPLANT
SUT SILK 3 0SH CR/8 30 (SUTURE) ×4 IMPLANT
SUT VIC AB 0 CTX 27 (SUTURE) IMPLANT
SUT VIC AB 1 CTX 27 (SUTURE) ×4 IMPLANT
SUT VIC AB 2-0 CT1 27 (SUTURE)
SUT VIC AB 2-0 CT1 TAPERPNT 27 (SUTURE) IMPLANT
SUT VIC AB 2-0 CTX 36 (SUTURE) ×8 IMPLANT
SUT VIC AB 3-0 MH 27 (SUTURE) IMPLANT
SUT VIC AB 3-0 SH 27 (SUTURE)
SUT VIC AB 3-0 SH 27X BRD (SUTURE) IMPLANT
SUT VIC AB 3-0 X1 27 (SUTURE) ×6 IMPLANT
SUT VICRYL 0 UR6 27IN ABS (SUTURE) IMPLANT
SUT VICRYL 2 TP 1 (SUTURE) IMPLANT
SYR 30ML LL (SYRINGE) ×4 IMPLANT
SYSTEM SAHARA CHEST DRAIN ATS (WOUND CARE) ×4 IMPLANT
TAPE CLOTH SURG 6X10 WHT LF (GAUZE/BANDAGES/DRESSINGS) ×2 IMPLANT
TIP APPLICATOR SPRAY EXTEND 16 (VASCULAR PRODUCTS) IMPLANT
TOWEL GREEN STERILE (TOWEL DISPOSABLE) ×4 IMPLANT
TOWEL GREEN STERILE FF (TOWEL DISPOSABLE) ×4 IMPLANT
TRAY FOLEY MTR SLVR 16FR STAT (SET/KITS/TRAYS/PACK) ×4 IMPLANT
TROCAR BLADELESS 5MM (ENDOMECHANICALS) ×2 IMPLANT
TROCAR XCEL BLADELESS 5X75MML (TROCAR) ×4 IMPLANT
TROCAR XCEL NON-BLD 5MMX100MML (ENDOMECHANICALS) IMPLANT
WATER STERILE IRR 1000ML POUR (IV SOLUTION) ×8 IMPLANT

## 2018-11-26 NOTE — Anesthesia Procedure Notes (Signed)
Arterial Line Insertion Start/End2/24/2020 7:00 AM Performed by: Trinna Post., CRNA, CRNA  Patient location: Pre-op. Preanesthetic checklist: patient identified, IV checked, site marked, risks and benefits discussed, surgical consent, monitors and equipment checked, pre-op evaluation, timeout performed and anesthesia consent Lidocaine 1% used for infiltration and patient sedated Left, radial was placed Catheter size: 20 G Hand hygiene performed  and maximum sterile barriers used   Attempts: 1 Procedure performed without using ultrasound guided technique. Following insertion, dressing applied and Biopatch. Post procedure assessment: normal  Patient tolerated the procedure well with no immediate complications.

## 2018-11-26 NOTE — Anesthesia Procedure Notes (Signed)
Central Venous Catheter Insertion Performed by: Myrtie Soman, MD, anesthesiologist Start/End2/24/2020 6:54 AM, 11/26/2018 7:14 AM Patient location: Pre-op. Preanesthetic checklist: patient identified, IV checked, site marked, risks and benefits discussed, surgical consent, monitors and equipment checked, pre-op evaluation, timeout performed and anesthesia consent Position: Trendelenburg Lidocaine 1% used for infiltration and patient sedated Hand hygiene performed  and maximum sterile barriers used  Catheter size: 8 Fr Total catheter length 16. Central line was placed.Double lumen PA Cath depth:15 Procedure performed using ultrasound guided technique. Ultrasound Notes:anatomy identified, needle tip was noted to be adjacent to the nerve/plexus identified, no ultrasound evidence of intravascular and/or intraneural injection and image(s) printed for medical record Attempts: 2 Following insertion, dressing applied, line sutured and Biopatch. Post procedure assessment: blood return through all ports  Patient tolerated the procedure well with no immediate complications. Additional procedure comments: Poor anatomy on right, attempt x 1 without success. Attempt x 1 on left, vessel id'd easily, guidewire passed easily.Marland Kitchen

## 2018-11-26 NOTE — Brief Op Note (Addendum)
11/26/2018  10:24 AM  PATIENT:  Janine Ores  78 y.o. male  PRE-OPERATIVE DIAGNOSIS:  LUL NODULE  POST-OPERATIVE DIAGNOSIS:  Non-small cell carcinoma, clinical stage IA  PROCEDURE:  Procedure(s):  LEFT VIDEO ASSISTED THORACOSCOPY  -Wedge Resection LUL -Intercostal Nerve Block levels 3-9   SURGEON:  Surgeon(s) and Role:    * Melrose Nakayama, MD - Primary  PHYSICIAN ASSISTANT: Ellwood Handler PA-C  ANESTHESIA:   general  EBL:  100 mL   BLOOD ADMINISTERED:none  DRAINS: 28 [address] Drain Left Chest   LOCAL MEDICATIONS USED:  MARCAINE    and BUPIVICAINE   SPECIMEN:  Source of Specimen:  LUL Wedge Resection  DISPOSITION OF SPECIMEN:  PATHOLOGY  COUNTS:  YES  TOURNIQUET:  * No tourniquets in log *  DICTATION: .Dragon Dictation  PLAN OF CARE: Admit to inpatient   PATIENT DISPOSITION:  PACU - hemodynamically stable.   Delay start of Pharmacological VTE agent (>24hrs) due to surgical blood loss or risk of bleeding: no  FINDINGS: severe adhesions throughout pleural space, frozen of nodule = NSCCA

## 2018-11-26 NOTE — Progress Notes (Signed)
Pt transferred form Pacu, placed in room alert and oriented , has no c/o pain, made comfortable, care continues.

## 2018-11-26 NOTE — Transfer of Care (Signed)
Immediate Anesthesia Transfer of Care Note  Patient: Steve [name]  Procedure(s) Performed: LEFT VIDEO ASSISTED THORACOSCOPY (VATS)/WEDGE RESECTION (Left Chest) Left Lateral Chest Tube Insertion (Left Chest)  Patient Location: PACU  Anesthesia Type:General  Level of Consciousness: awake, alert  and oriented  Airway & Oxygen Therapy: Patient Spontanous Breathing and Patient connected to face mask oxygen  Post-op Assessment: Report given to RN and Post -op Vital signs reviewed and stable  Post vital signs: Reviewed and stable  Last Vitals:  Vitals Value Taken Time  BP    Temp    Pulse    Resp    SpO2      Last Pain:  Vitals:   11/26/18 0610  TempSrc:   PainSc: 0-No pain         Complications: No apparent anesthesia complications

## 2018-11-26 NOTE — Op Note (Signed)
NAME: Steve [name], JASMIN MEDICAL RECORD EN:[id number] ACCOUNT [id number] DATE OF BIRTH:1941/09/22 FACILITY: [facility] LOCATION: [facility] PHYSICIAN:Cadance Raus Chaya Jan, MD  OPERATIVE REPORT  DATE OF PROCEDURE:  11/26/2018  PREOPERATIVE DIAGNOSIS:  Left upper lobe lung nodule.  POSTOPERATIVE DIAGNOSIS:  Nonsmall cell carcinoma left upper lobe, clinical stage IA (T1, N0).  PROCEDURE:   Left video-assisted thoracoscopy, Wedge resection left upper lobe nodule Intercostal nerve blocks.  SURGEON:  Modesto Charon, MD  ASSISTANT:  Ellwood Handler, PA  ANESTHESIA:  General.  FINDINGS:  Severe adhesions in the pleural space.  Frozen section revealed nonsmall cell carcinoma with negative stapled margin.  CLINICAL NOTE:  The patient is an 78 year old gentleman with a history of tobacco abuse who had been followed with CTs for an ascending aneurysm.  He recently had an increase in size of a left upper lobe lung nodule.  A PET CT showed the nodule was  hypermetabolic but also showed a hypermetabolic lesion in his cecum.  He had undergone colon surgery back in December and now presents for lung surgery.  The indications, risks, benefits, and alternatives were discussed in detail with the patient.  He  understood and accepted the risks and agreed to proceed.  OPERATIVE NOTE:  The patient was brought to the preoperative holding area on 11/26/2018.  Anesthesia placed a central venous line and an arterial blood pressure monitor line.  He was taken to the Operating Room, anesthetized and intubated.  Intravenous  antibiotics were administered.  A Foley catheter was placed.  Sequential compression devices were placed on the calves for DVT prophylaxis.  He was placed in a right lateral decubitus position and the left chest was prepped and draped in the usual  sterile fashion.  Single lung ventilation of the right lung was initiated and was tolerated well throughout the procedure.  A timeout was performed.  A  solution containing 20 mL of liposomal bupivacaine, 30 mL of 0.5% bupivacaine and 50 mL of saline was prepared.  This was used for local at the incisions as well as the intercostal nerve blocks.  Local was injected into the  sites before making the incisions.  An incision was made in the seventh interspace in the mid axillary line.  A 5 mm port was inserted into the chest.  The thoracoscope was advanced into the chest.  There was a clear space inferiorly and laterally, but the entire upper lobe was densely  adherent to the chest wall was the lower lobe posteriorly and along the diaphragm.  A 5 cm working incision was made in the 4th interspace anterolaterally.  No rib spreading was performed during the procedure.  The adhesions then were taken down. As the  area of the nodule was approached, a portion of the pleural fat was taken along with the lung, but for the most part, the adhesions were taken down between the lung and the pleura.  More anteriorly, the adhesions became very dense superiorly at the  apex.  They were also relatively vascular.  Once these had been freed up, the nodule was palpable.  A wedge resection was performed with sequential firings of the 60 mm Echelon stapler using gold cartridges maintaining a 2 cm gross margin.  The specimen was  placed into an endoscopic retrieval bag, removed and sent for frozen section of the nodule and the closest margin.  Intercostal nerve blocks then were performed from the third to the ninth interspaces.  The needle was passed from a posterior approach  and the bupivacaine  solution was injected into a subpleural plane.  10 mL was injected in each interspace from the 3rd to the 9th interspace.  Additional adhesions were taken down but this became progressively more difficult, particularly anteriorly along the mediastinum.  The frozen section returned showing a nonsmall cell carcinoma.  The stapled margin was negative.  Given the patient's age  and  associated medical conditions, it was felt that the benefit of an anatomic resection was outweighed by the risk of trying to complete the resection in the setting of the adhesions.  A 28-French chest tube was placed through the original port  incision, was directed to the apex and secured to skin with a #1 silk suture.  Dual-lung ventilation was resumed.  The working incision was closed in 3 layers.  The chest tube was placed to suction.  The patient was placed back in supine position.  He  was extubated in the operating room and taken to the [facility] in good condition.  All sponge, needle and instrument counts were correct at the end of the procedure.  TN/NUANCE  D:11/26/2018 T:11/26/2018 JOB:[id number]

## 2018-11-26 NOTE — OR Nursing (Signed)
REMOVED PATIENTS ADULT DIAPER PRIOR TO INSERTING FOLEY. MAY NEED A REPLACEMENT AFTER REMOVING THE FOLEY.

## 2018-11-26 NOTE — Interval H&P Note (Signed)
History and Physical Interval Note: Repeat CT shows slight increase in size of nodule Will need follow up on possible liver lesion  11/26/2018 7:24 AM  Steve [name]  has presented today for surgery, with the diagnosis of LUL NODULE  The various methods of treatment have been discussed with the patient and family. After consideration of risks, benefits and other options for treatment, the patient has consented to  Procedure(s): VIDEO ASSISTED THORACOSCOPY (VATS)/WEDGE RESECTION (Left) possible SEGMENTECTOMY (Left) possible LOBECTOMY (Left) as a surgical intervention .  The patient's history has been reviewed, patient examined, no change in status, stable for surgery.  I have reviewed the patient's chart and labs.  Questions were answered to the patient's satisfaction.     Melrose Nakayama

## 2018-11-26 NOTE — Anesthesia Procedure Notes (Signed)
Procedure Name: Intubation Date/Time: 11/26/2018 7:49 AM Performed by: Trinna Post., CRNA Pre-anesthesia Checklist: Patient identified, Emergency Drugs available, Suction available, Patient being monitored and Timeout performed Patient Re-evaluated:Patient Re-evaluated prior to induction Oxygen Delivery Method: Circle system utilized Preoxygenation: Pre-oxygenation with 100% oxygen Induction Type: IV induction Ventilation: Mask ventilation without difficulty and Oral airway inserted - appropriate to patient size Laryngoscope Size: Mac and 4 Grade View: Grade I Tube type: Oral Endobronchial tube: Left, Double lumen EBT, EBT position confirmed by auscultation and EBT position confirmed by fiberoptic bronchoscope and 39 Fr Number of attempts: 1 Airway Equipment and Method: Stylet and Fiberoptic brochoscope Placement Confirmation: ETT inserted through vocal cords under direct vision,  positive ETCO2 and breath sounds checked- equal and bilateral Tube secured with: Tape Dental Injury: Teeth and Oropharynx as per pre-operative assessment

## 2018-11-26 NOTE — Anesthesia Procedure Notes (Signed)
Anesthesia Procedure Image    

## 2018-11-26 NOTE — Anesthesia Postprocedure Evaluation (Signed)
Anesthesia Post Note  Patient: Steve [name]  Procedure(s) Performed: LEFT VIDEO ASSISTED THORACOSCOPY (VATS)/WEDGE RESECTION (Left Chest) Left Lateral Chest Tube Insertion (Left Chest)     Patient location during evaluation: PACU Anesthesia Type: General Level of consciousness: awake and alert Pain management: pain level controlled Vital Signs Assessment: post-procedure vital signs reviewed and stable Respiratory status: spontaneous breathing, nonlabored ventilation, respiratory function stable and patient connected to nasal cannula oxygen Cardiovascular status: blood pressure returned to baseline and stable Postop Assessment: no apparent nausea or vomiting Anesthetic complications: no    Last Vitals:  Vitals:   11/26/18 0710 11/26/18 1045  BP:  131/62  Pulse: 70 60  Resp: (!) 23 (!) 22  Temp:  (!) 36.4 C  SpO2: 99% 100%    Last Pain:  Vitals:   11/26/18 1115  TempSrc:   PainSc: 0-No pain                 Audry Pili

## 2018-11-27 ENCOUNTER — Inpatient Hospital Stay (HOSPITAL_COMMUNITY): Payer: Medicare Other

## 2018-11-27 ENCOUNTER — Encounter (HOSPITAL_COMMUNITY): Payer: Self-pay

## 2018-11-27 LAB — CBC
HCT: 31.4 % — ABNORMAL LOW (ref 39.0–52.0)
Hemoglobin: 9.6 g/dL — ABNORMAL LOW (ref 13.0–17.0)
MCH: 28.5 pg (ref 26.0–34.0)
MCHC: 30.6 g/dL (ref 30.0–36.0)
MCV: 93.2 fL (ref 80.0–100.0)
Platelets: 175 10*3/uL (ref 150–400)
RBC: 3.37 MIL/uL — AB (ref 4.22–5.81)
RDW: 14.2 % (ref 11.5–15.5)
WBC: 12.1 10*3/uL — ABNORMAL HIGH (ref 4.0–10.5)
nRBC: 0 % (ref 0.0–0.2)

## 2018-11-27 LAB — BLOOD GAS, ARTERIAL
Acid-Base Excess: 0 mmol/L (ref 0.0–2.0)
Bicarbonate: 24.8 mmol/L (ref 20.0–28.0)
O2 Content: 2 L/min
O2 Saturation: 96.7 %
Patient temperature: 98.6
pCO2 arterial: 45.4 mmHg (ref 32.0–48.0)
pH, Arterial: 7.357 (ref 7.350–7.450)
pO2, Arterial: 88.7 mmHg (ref 83.0–108.0)

## 2018-11-27 LAB — BASIC METABOLIC PANEL
Anion gap: 6 (ref 5–15)
BUN: 21 mg/dL (ref 8–23)
CALCIUM: 8.2 mg/dL — AB (ref 8.9–10.3)
CO2: 25 mmol/L (ref 22–32)
Chloride: 104 mmol/L (ref 98–111)
Creatinine, Ser: 1.72 mg/dL — ABNORMAL HIGH (ref 0.61–1.24)
GFR calc Af Amer: 43 mL/min — ABNORMAL LOW (ref >60)
GFR calc non Af Amer: 37 mL/min — ABNORMAL LOW (ref >60)
Glucose, Bld: 150 mg/dL — ABNORMAL HIGH (ref 70–99)
Potassium: 4.9 mmol/L (ref 3.5–5.1)
SODIUM: 135 mmol/L (ref 135–145)

## 2018-11-27 MED ORDER — GUAIFENESIN ER 600 MG PO TB12
600.0000 mg | ORAL_TABLET | Freq: Two times a day (BID) | ORAL | Status: DC
  Administered 2018-11-27 – 2018-11-30 (×8): 600 mg via ORAL
  Filled 2018-11-27 (×10): qty 1

## 2018-11-27 MED ORDER — CHLORHEXIDINE GLUCONATE CLOTH 2 % EX PADS
6.0000 | MEDICATED_PAD | Freq: Every day | CUTANEOUS | Status: DC
  Administered 2018-11-27 – 2018-11-28 (×2): 6 via TOPICAL

## 2018-11-27 MED ORDER — MORPHINE SULFATE (PF) 2 MG/ML IV SOLN: 1.0000 mg | INTRAVENOUS | Status: DC | PRN

## 2018-11-27 NOTE — Discharge Summary (Addendum)
Physician Discharge Summary  Patient ID: Steve [name] MRN: [id number] DOB/AGE: 04/15/1941 78 y.o.  Admit date: 11/26/2018 Discharge date: 12/03/2018  Admission Diagnoses:  Patient Active Problem List   Diagnosis Date Noted  . Nodule of upper lobe of left lung 08/07/2018  . Adrenal nodule ([place]) 08/07/2018  . Essential hypertension 12/01/2016  . Aortic aneurysm ([place]) 06/19/2014  . Traction retinal detachment 11/26/2012  . Rhegmatogenous retinal detachment 10/17/2012  . 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 S/P Wedge Resection LUL 11/26/2018  . Nodule of upper lobe of left lung 08/07/2018  . Adrenal nodule ([place]) 08/07/2018  . Essential hypertension 12/01/2016  . Aortic aneurysm ([place]) 06/19/2014  . Traction retinal detachment 11/26/2012  . Rhegmatogenous retinal detachment 10/17/2012  . 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  He has been followed since 2015 for an ascending aneurysm.  In 2019 he had a CT to reassess the aneurysm.  It was stable but he had a left upper lobe lung nodule that had increased in size from 3 mm to 12 mm.  He did a PET CT which showed the nodule was hypermetabolic.  He also had a hypermetabolic lesion in his cecum.  The cecal lesion turned out to be a colon cancer.  He had a resection for that in December at [facility]  Apparently had an ileus postop and was in the hospital for about 2 weeks.  He was initially evaluated by Dr. Roxan Hockey in November  on 2019.  At that time he recommended PET CT which initially found the metabolic colon lesion. He was subsequently referred to gastroenterology.  He required the above mentioned procedure.  He again presented for follow up with Dr. Roxan Hockey on 11/13/2018.  At that time he stated he felt well.  He is ready to go ahead and have his lung nodule taking care off.  He is not having any chest pain or shortness of breath.  He is walking with a cane but only carries it for safety.  He is not bearing any weight on it.  Dr. Roxan Hockey explained the risks and benefits of the procedure were explained to the patient and he was agreeable to proceed.  Hospital Course:   [name] presented to [facility] on 11/26/2018.  He was taken to the operating room and underwent Left VATS with wedge resection LUL, Intercostal nerve block, and resection of pleural adhesions.  He tolerated the procedure, was extubated and taken to the SICU in stable condition. A line and foley were removed early in his post operative course.  He did have much of an appetite post op. He did have a bowel movement but x ray showed gastric dilatation. He was given scheduled Reglan. His chest tube exhibited a small air leak. This became smaller and more intermittent. Chest x rays were obtained daily and remained stable. Chest tube was placed to water seal on 02/28. Follow up chest x ray showed stable appearance of chest CXR  He had a small air leak which resolved.  He did develop mild sub q  emphysema along the lateral chest wall.  However patient did step on his chest tube and it pulled, with some advancement of chest tube out of chest.  Chest tube was advanced back in place.  his air leak resolved with chest tube being removed on 12/02/2018.  Follow up CXR showed: no definite residual left pneumothorax. Chest wall emphysema is decreasing. . Patient was ambulating on 2 liters oxygen via [place]. He was weaned to room air. Wounds are clean and dry and  continuing to heal. He is felt surgically stable for discharge home.     Significant Diagnostic Studies: nuclear medicine:   1. The left upper lobe pulmonary nodule exhibits intense radiotracer uptake compatible with primary bronchogenic carcinoma. Assuming a non-small cell histology imaging findings are compatible with a T1bN0M0 lesion. 2. Focal area of increased radiotracer uptake localizing to the cecum with SUV max equal to 20.66. Underlying colonic neoplasm cannot be excluded and correlation with colonoscopy advised. 3. Aortic Atherosclerosis (ICD10-I70.0) and Emphysema (ICD10-J43.9). 4. Gallstone 5. Left adrenal gland myelolipoma.  Treatments: surgery:   Left video-assisted thoracoscopy, wedge resection, left upper lobe nodule intercostal nerve blocks.  Discharge Exam: Blood pressure (!) 113/54, pulse 71, temperature 98.1 F (36.7 C), temperature source Oral, resp. rate 18, height 5\' 7"  (1.702 m), weight 94.8 kg, SpO2 92 %.   General appearance: alert, cooperative and no distress Neurologic: intact Heart: regular rate and rhythm Lungs: clear to auscultation bilaterally Wound: clean and dry   Disposition: Stable and discharged to home.   Allergies as of 12/03/2018   No Known Allergies     Medication List    STOP taking these medications   ibuprofen 200 MG tablet Commonly known as:  ADVIL,MOTRIN     TAKE these medications   amLODipine 5 MG tablet Commonly known as:  NORVASC TAKE 1 TABLET ONCE DAILY. What changed:    how much to take  how to take this  when to take this   aspirin EC 81 MG tablet Take 81 mg by mouth every other day.   atorvastatin 40 MG tablet Commonly known as:  LIPITOR TAKE (1) TABLET BY MOUTH ONCE DAILY. What changed:  See the new instructions.   furosemide 40 MG tablet Commonly known as:  LASIX Take 1 tablet (40 mg total) by mouth daily.   metoprolol tartrate 25 MG tablet Commonly known as:  LOPRESSOR Take 12.5 mg by mouth every  evening.   oxyCODONE 5 MG immediate release tablet Commonly known as:  Oxy IR/ROXICODONE Take 1 tablet (5 mg total) by mouth every 6 (six) hours as needed for severe pain.   potassium chloride SA 20 MEQ tablet Commonly known as:  K-DUR,KLOR-CON Take 1 tablet (20 mEq total) by mouth daily.      Follow-up Information    Melrose Nakayama, MD. Go on 12/18/2018.   Specialty:  Cardiothoracic Surgery Why:  PA/LAT CXR to be taken (at [facility] which is in the same building as Dr. Leonarda Salon office) on 03/17 at 8:45 am;Appointment time is at 9:15 am Contact information: [address] [place] [address] [phone]        [facility] Follow up on 12/06/2018.   Specialty:  Cardiothoracic Surgery Why:  You have an appointment with the RN at Dr. Leonarda Salon office for this Thursday at 10:15am for suture removal.  Contact information: [address] [place], [place] [phone]  Signed: Antony Odea PA-C 12/03/2018, 1:52 PM

## 2018-11-27 NOTE — Progress Notes (Signed)
Morphine 22 mls disposed of in stericycle , witnessed by Drucie Opitz.

## 2018-11-27 NOTE — Progress Notes (Addendum)
      [address]       [place] [address]             [phone]       1 Day Post-Op Procedure(s) (LRB): LEFT VIDEO ASSISTED THORACOSCOPY (VATS)/WEDGE RESECTION (Left) Left Lateral Chest Tube Insertion (Left)  Subjective: Patient without specific complaints this am.  Objective: Vital signs in last 24 hours: Temp:  [97.5 F (36.4 C)-98.3 F (36.8 C)] 97.9 F (36.6 C) (02/25 0400) Pulse Rate:  [58-69] 58 (02/25 0600) Cardiac Rhythm: Normal sinus rhythm;Bundle branch block (02/24 2013) Resp:  [14-26] 17 (02/25 0600) BP: (96-131)/(55-79) 108/59 (02/25 0400) SpO2:  [94 %-100 %] 96 % (02/25 0600) Arterial Line BP: (104-141)/(46-65) 122/51 (02/25 0600)      Intake/Output from previous day: 02/24 0701 - 02/25 0700 In: 2774.5 [P.O.:195; I.V.:2317.8; IV Piggyback:261.7] Out: 1165 [Urine:775; Blood:100; Chest Tube:290]   Physical Exam:  Cardiovascular: RRR Pulmonary: Coarse breath sounds on the left Abdomen: Soft, non tender, bowel sounds present. Extremities: Trace bilateral lower extremity edema. Wounds: Dressing is clean and dry.   Chest Tube:to suction, small air leak that worsens with cough  Lab Results: CBC: Recent Labs    11/27/18 0257  WBC 12.1*  HGB 9.6*  HCT 31.4*  PLT 175   BMET:  Recent Labs    11/27/18 0257  NA 135  K 4.9  CL 104  CO2 25  GLUCOSE 150*  BUN 21  CREATININE 1.72*  CALCIUM 8.2*    PT/INR: No results for input(s): LABPROT, INR in the last 72 hours. ABG:  INR: Will add last result for INR, ABG once components are confirmed Will add last 4 CBG results once components are confirmed  Assessment/Plan:  1. CV - SR in the 60's. On Lopressor 12.5 mg nightly and Amlodipine 5 mg daily. 2.  Pulmonary - History of COPD and OSA. On 2 liters via [place]. Chest tube with 290 cc of output last 24 hours. There is small air leak that worsens with cough. CXR this am appears stable (bibasilar atelectasis). Encourage incentive  spirometer and flutter valve. Await final pathology (frozen showed NSCC) 3. Anemia-H and H 9.6 and 31.4 4. Creatinine 1.72 this am. Creatinine 1.71 prior to admission. On Lasix 40 mg daily. Monitor 5. Remove a line and decrease IVF  Donielle M ZimmermanPA-C 11/27/2018,8:20 AM [phone]  Patient seen and examined, agree with above CXR OK Advance diet as tolerated Mobilize SCD + enoxaparin for DVT prophylaxis  Remo Lipps C. Roxan Hockey, MD [facility] [phone]

## 2018-11-28 ENCOUNTER — Inpatient Hospital Stay (HOSPITAL_COMMUNITY): Payer: Medicare Other

## 2018-11-28 LAB — COMPREHENSIVE METABOLIC PANEL
ALT: <5 U/L (ref 0–44)
AST: 15 U/L (ref 15–41)
Albumin: 2.6 g/dL — ABNORMAL LOW (ref 3.5–5.0)
Alkaline Phosphatase: 83 U/L (ref 38–126)
Anion gap: 8 (ref 5–15)
BUN: 25 mg/dL — AB (ref 8–23)
CO2: 28 mmol/L (ref 22–32)
Calcium: 8.3 mg/dL — ABNORMAL LOW (ref 8.9–10.3)
Chloride: 100 mmol/L (ref 98–111)
Creatinine, Ser: 1.84 mg/dL — ABNORMAL HIGH (ref 0.61–1.24)
GFR calc Af Amer: 39 mL/min — ABNORMAL LOW (ref >60)
GFR calc non Af Amer: 34 mL/min — ABNORMAL LOW (ref >60)
Glucose, Bld: 129 mg/dL — ABNORMAL HIGH (ref 70–99)
Potassium: 5 mmol/L (ref 3.5–5.1)
Sodium: 136 mmol/L (ref 135–145)
Total Bilirubin: 0.7 mg/dL (ref 0.3–1.2)
Total Protein: 6.1 g/dL — ABNORMAL LOW (ref 6.5–8.1)

## 2018-11-28 LAB — CBC
HCT: 34.1 % — ABNORMAL LOW (ref 39.0–52.0)
Hemoglobin: 10.6 g/dL — ABNORMAL LOW (ref 13.0–17.0)
MCH: 28.8 pg (ref 26.0–34.0)
MCHC: 31.1 g/dL (ref 30.0–36.0)
MCV: 92.7 fL (ref 80.0–100.0)
Platelets: 180 10*3/uL (ref 150–400)
RBC: 3.68 MIL/uL — ABNORMAL LOW (ref 4.22–5.81)
RDW: 14.5 % (ref 11.5–15.5)
WBC: 12.3 10*3/uL — ABNORMAL HIGH (ref 4.0–10.5)
nRBC: 0 % (ref 0.0–0.2)

## 2018-11-28 MED ORDER — FUROSEMIDE 40 MG PO TABS: 40.0000 mg | ORAL_TABLET | Freq: Every day | ORAL | Status: DC

## 2018-11-28 MED ORDER — FUROSEMIDE 40 MG PO TABS
40.0000 mg | ORAL_TABLET | Freq: Every day | ORAL | Status: DC
  Administered 2018-11-28 – 2018-12-03 (×6): 40 mg via ORAL
  Filled 2018-11-28 (×6): qty 1

## 2018-11-28 NOTE — Progress Notes (Addendum)
      [address]       [place] [address]             [phone]       2 Days Post-Op Procedure(s) (LRB): LEFT VIDEO ASSISTED THORACOSCOPY (VATS)/WEDGE RESECTION (Left) Left Lateral Chest Tube Insertion (Left)  Subjective: Patient without specific complaints this am.  Objective: Vital signs in last 24 hours: Temp:  [97.4 F (36.3 C)-98.4 F (36.9 C)] 98.1 F (36.7 C) (02/26 0400) Pulse Rate:  [65-76] 71 (02/26 0400) Cardiac Rhythm: Normal sinus rhythm;Bundle branch block (02/25 2028) Resp:  [14-24] 14 (02/26 0400) BP: (92-130)/(50-75) 128/75 (02/26 0400) SpO2:  [94 %-96 %] 94 % (02/26 0400)     Intake/Output from previous day: 02/25 0701 - 02/26 0700 In: -  Out: 960 [Urine:850; Chest Tube:110]   Physical Exam:  Cardiovascular: RRR Pulmonary: Coarse breath sounds on the left Abdomen: Soft, non tender, bowel sounds present. Extremities: SCDs in place Wounds: Dressing is clean and dry.   Chest Tube:to suction, small air leak that worsens with cough  Lab Results: CBC: Recent Labs    11/27/18 0257 11/28/18 0525  WBC 12.1* 12.3*  HGB 9.6* 10.6*  HCT 31.4* 34.1*  PLT 175 180   BMET:  Recent Labs    11/27/18 0257 11/28/18 0525  NA 135 136  K 4.9 5.0  CL 104 100  CO2 25 28  GLUCOSE 150* 129*  BUN 21 25*  CREATININE 1.72* 1.84*  CALCIUM 8.2* 8.3*    PT/INR: No results for input(s): LABPROT, INR in the last 72 hours. ABG:  INR: Will add last result for INR, ABG once components are confirmed Will add last 4 CBG results once components are confirmed  Assessment/Plan:  1. CV - SR in the 70's. On Lopressor 12.5 mg nightly and Amlodipine 5 mg daily. 2.  Pulmonary - History of COPD and OSA. On 2 liters via [place]. Chest tube with 110 cc of output last 24 hours. Chest tube is to suction and there is small air leak that worsens with cough. CXR this am appears stable (bibasilar atelectasis and pulmonary vascular congestion). Hope to place  chest tube to water seal soon. Encourage incentive spirometer and flutter valve. Await final pathology (frozen showed NSCC) 3. Anemia-H and H stable at 10.6 and 34.14 4. Creatinine increased to 1.84 this am. Creatinine 1.71 prior to admission. On Lasix 40 mg daily-will hold today, discuss with Dr. Roxan Hockey, and recheck BMET in am 5. Remove central line and likely remove foley 6. Patient was not using PCA so it was discontinued late yesterday  Sharalyn Ink ZimmermanPA-C 11/28/2018,7:28 AM [phone]  Patient seen and examined, agree with above except would not hold lasix  Remo Lipps C. Roxan Hockey, MD [facility] [phone]

## 2018-11-28 NOTE — Discharge Instructions (Signed)
Discharge Instructions:  1. You may shower, please wash incisions daily with soap and water and keep dry.  If you wish to cover wounds with dressing you may do so but please keep clean and change daily.  No tub baths or swimming until incisions have completely healed.  If your incisions become red or develop any drainage please call our office at [phone]  2. No Driving until cleared by Dr. Leonarda Salon office and you are no longer using narcotic pain medications  3. Fever of 101.5 for at least 24 hours with no source, please contact our office at [phone]  4. Activity- up as tolerated, please walk at least 3 times per day.    5. If any questions or concerns arise, please do not hesitate to contact our office at [phone]

## 2018-11-28 NOTE — Consult Note (Signed)
[facility] CM Primary Care Navigator  11/28/2018  Steve [name] 02-19-1941 [id number]   Went to see patient at the bedside to identify possible discharge needs but he is fast asleep with lights off and no family noted at the bedside.    Will attempt to see patient at another time whenhe is available in the room.     Addendum (11/30/18):   Went back for follow-up and met with patient, wife Steve [name]) and daughter Steve [name]) at the bedside toidentify possible discharge needs.   Wife reports that patient came back for management of his left upper lobe lung nodule (that increased in size) few weeks after managing his colon cancer.    Patientconf[facility], but mentioned of plan to change provider to another one in that same practice.   [facility] [place] [facility] without difficulty.  Patientstates that wife has beenmanaging hisownmedications at [employer] out of the containers.  Patientreports that wife has been driving and providing transportation to his doctors' appointments, otherwise, his children will transport if needed.  Patient liveswith wife who will serve as his primary caregiverat home.  Anticipated plan for dischargeispossibly home with home oxygen per RN. Patient expressed hope to be discharge home.  Patientand wife voiced understandingto call primarycareprovider's office when hereturnsto home,for a post discharge follow-up visit within 1- 2 weeksor sooner if needs arise.Patient letter (with PCP's contact number) was provided asareminder.  Discussed with patientand family regarding[facility] CM services available for health management andresourcesat homebut patient  expressed adamant decline of services including EMMI calls for follow-up. Patient's wife reports assisting patient in managing his health needs/  conditions at home. COPD action plan discussed with patient/ wife. Was informed that they have weighing scale to monitor his weight and was encouraged to follow low salt diet, with close follow-up with provider.  Patientand wife were encouraged todiscuss with primary care provider onhisnext visit,aboutfurther needs and available assistance that may be needed tomanagehishealth conditions at home. Patientand family voicedunderstandingto seekreferral from primary care provider to [facility] care management ifpatient changes his mind, and as deemed necessary and appropriatefor anyservicesin the nearfuture.  [facility] care management information was provided for future needs thatpatientmay have.  Primary care provider's office is listed as providing transition of care(TOC).    For additional questions please contact:  Edwena Felty A. Abril Cappiello, BSN, RN-BC [facility] PRIMARY CARE Navigator Cell: [phone]

## 2018-11-28 NOTE — Plan of Care (Signed)

## 2018-11-29 ENCOUNTER — Inpatient Hospital Stay (HOSPITAL_COMMUNITY): Payer: Medicare Other

## 2018-11-29 LAB — BASIC METABOLIC PANEL
Anion gap: 9 (ref 5–15)
BUN: 29 mg/dL — ABNORMAL HIGH (ref 8–23)
CO2: 27 mmol/L (ref 22–32)
Calcium: 8.4 mg/dL — ABNORMAL LOW (ref 8.9–10.3)
Chloride: 99 mmol/L (ref 98–111)
Creatinine, Ser: 1.75 mg/dL — ABNORMAL HIGH (ref 0.61–1.24)
GFR calc Af Amer: 42 mL/min — ABNORMAL LOW (ref >60)
GFR calc non Af Amer: 36 mL/min — ABNORMAL LOW (ref >60)
Glucose, Bld: 136 mg/dL — ABNORMAL HIGH (ref 70–99)
Potassium: 4.6 mmol/L (ref 3.5–5.1)
Sodium: 135 mmol/L (ref 135–145)

## 2018-11-29 MED ORDER — METOCLOPRAMIDE HCL 5 MG/ML IJ SOLN
10.0000 mg | Freq: Four times a day (QID) | INTRAMUSCULAR | Status: AC
  Administered 2018-11-29 – 2018-11-30 (×8): 10 mg via INTRAVENOUS
  Filled 2018-11-29 (×7): qty 2

## 2018-11-29 NOTE — Progress Notes (Signed)
At rest O2 SAT 88-90% on room air. At rest with 2 liters nasal canula 90-92% During ambulation on room air O2 SAT 87-90% During ambulation with 2 liter nasal canula O2 SAT 92-94%  Alert and oriented x 4. Chest tube to left lateral chest wall remains intact, clean, and dry. Draining serosanguinous fluid. Safety precautions maintained.

## 2018-11-29 NOTE — Care Management Important Message (Signed)
Important Message  Patient Details  Name: Steve [name] MRN: [id number] Date of Birth: 08-05-1941   Medicare Important Message Given:  Yes    Shelda Altes 11/29/2018, 12:59 PM

## 2018-11-29 NOTE — Progress Notes (Signed)
3 Days Post-Op Procedure(s) (LRB): LEFT VIDEO ASSISTED THORACOSCOPY (VATS)/WEDGE RESECTION (Left) Left Lateral Chest Tube Insertion (Left) Subjective: Denies nausea, + BM last night Pain well controlled, appetite poor  Objective: Vital signs in last 24 hours: Temp:  [97.5 F (36.4 C)-97.9 F (36.6 C)] 97.6 F (36.4 C) (02/27 0740) Pulse Rate:  [74-100] 74 (02/27 0417) Cardiac Rhythm: Normal sinus rhythm;Bundle branch block (02/26 2300) Resp:  [14-29] 20 (02/27 0417) BP: (97-119)/(61-71) 110/70 (02/27 0417) SpO2:  [86 %-97 %] 97 % (02/27 0417)  Hemodynamic parameters for last 24 hours:    Intake/Output from previous day: 02/26 0701 - 02/27 0700 In: 1237 [P.O.:720; I.V.:517] Out: 280 [Urine:150; Chest Tube:130] Intake/Output this shift: No intake/output data recorded.  General appearance: alert, cooperative and no distress Neurologic: intact Heart: regular rate and rhythm Lungs: diminished breath sounds bibasilar Abdomen: distended, tympanitic, nontender + air leak  Lab Results: Recent Labs    11/27/18 0257 11/28/18 0525  WBC 12.1* 12.3*  HGB 9.6* 10.6*  HCT 31.4* 34.1*  PLT 175 180   BMET:  Recent Labs    11/28/18 0525 11/29/18 0121  NA 136 135  K 5.0 4.6  CL 100 99  CO2 28 27  GLUCOSE 129* 136*  BUN 25* 29*  CREATININE 1.84* 1.75*  CALCIUM 8.3* 8.4*    PT/INR: No results for input(s): LABPROT, INR in the last 72 hours. ABG    Component Value Date/Time   PHART 7.357 11/27/2018 0300   HCO3 24.8 11/27/2018 0300   TCO2 25 05/15/2012 2011   O2SAT 96.7 11/27/2018 0300   CBG (last 3)  No results for input(s): GLUCAP in the last 72 hours.  Assessment/Plan: S/P Procedure(s) (LRB): LEFT VIDEO ASSISTED THORACOSCOPY (VATS)/WEDGE RESECTION (Left) Left Lateral Chest Tube Insertion (Left) -  Overall stable CXR shows gastric dilatation- no symptoms but prolonged ileus after recent abdominal surgery. Will give reglan IV x 48 hours Still has an air leak-  keep CT to suction Ambulate  SCD + enoxaparin Renal function stable at baseline creatinine   LOS: 3 days    Steve [name] 11/29/2018

## 2018-11-30 ENCOUNTER — Inpatient Hospital Stay (HOSPITAL_COMMUNITY): Payer: Medicare Other

## 2018-11-30 LAB — BASIC METABOLIC PANEL
Anion gap: 10 (ref 5–15)
BUN: 28 mg/dL — ABNORMAL HIGH (ref 8–23)
CO2: 26 mmol/L (ref 22–32)
Calcium: 8.3 mg/dL — ABNORMAL LOW (ref 8.9–10.3)
Chloride: 98 mmol/L (ref 98–111)
Creatinine, Ser: 1.76 mg/dL — ABNORMAL HIGH (ref 0.61–1.24)
GFR calc Af Amer: 41 mL/min — ABNORMAL LOW (ref >60)
GFR calc non Af Amer: 36 mL/min — ABNORMAL LOW (ref >60)
GLUCOSE: 106 mg/dL — AB (ref 70–99)
Potassium: 4.3 mmol/L (ref 3.5–5.1)
Sodium: 134 mmol/L — ABNORMAL LOW (ref 135–145)

## 2018-11-30 NOTE — Progress Notes (Addendum)
      [address]       [place] [address]             [phone]       4 Days Post-Op Procedure(s) (LRB): LEFT VIDEO ASSISTED THORACOSCOPY (VATS)/WEDGE RESECTION (Left) Left Lateral Chest Tube Insertion (Left)  Subjective: Patient denies nausea or emesis. He had 2 bowel movement yesterday.  Objective: Vital signs in last 24 hours: Temp:  [97.4 F (36.3 C)-97.9 F (36.6 C)] 97.8 F (36.6 C) (02/28 0317) Pulse Rate:  [70-77] 77 (02/28 0317) Cardiac Rhythm: Normal sinus rhythm (02/27 2000) Resp:  [14-21] 15 (02/28 0317) BP: (108-119)/(62-73) 115/62 (02/28 0317) SpO2:  [88 %-99 %] 93 % (02/28 0317)     Intake/Output from previous day: 02/27 0701 - 02/28 0700 In: 480 [P.O.:480] Out: 70 [Chest Tube:70]   Physical Exam:  Cardiovascular: RRR Pulmonary: Diminished basilar breath sounds Abdomen: Soft, non tender, bowel sounds present. Extremities: Trace LE edema Wounds: Clean and dry, minor ecchymosis    Chest Tube:to suction, appears to have trace air leak that is very intermittent  Lab Results: CBC: Recent Labs    11/28/18 0525  WBC 12.3*  HGB 10.6*  HCT 34.1*  PLT 180   BMET:  Recent Labs    11/29/18 0121 11/30/18 0230  NA 135 134*  K 4.6 4.3  CL 99 98  CO2 27 26  GLUCOSE 136* 106*  BUN 29* 28*  CREATININE 1.75* 1.76*  CALCIUM 8.4* 8.3*    PT/INR: No results for input(s): LABPROT, INR in the last 72 hours. ABG:  INR: Will add last result for INR, ABG once components are confirmed Will add last 4 CBG results once components are confirmed  Assessment/Plan:  1. CV - SR in the 70's. On Lopressor 12.5 mg nightly and Amlodipine 5 mg daily. 2.  Pulmonary - History of COPD and OSA. On 2 liters via [place]. Chest tube with 70 cc of output last 24 hours. Chest tube is to suction and appears to have trace air leak that is very intermittent. CXR this am appears stable (bibasilar atelectasis and pulmonary vascular congestion).  Hope to place  chest tube to water seal. Encourage incentive spirometer and flutter valve. Final pathology:adenocarcinoma,TNM code:pT3, pNX 3. Anemia-Last H and H stable at 10.6 and 34.1 4. Creatinine  to 1.76 this am. Creatinine 1.71 prior to admission. Continue Lasix 40 mg daily 5. GI-gastric dilatation, ileus but is improving. Scheduled Reglan started yesterday.  Donielle M ZimmermanPA-C 11/30/2018,7:12 AM [phone]  Patient seen and examined, agree with above Small air leak- will try on water seal Abdomen less distended, + BM  Steven C. Roxan Hockey, MD [facility] [phone]

## 2018-11-30 NOTE — Plan of Care (Signed)

## 2018-12-01 ENCOUNTER — Inpatient Hospital Stay (HOSPITAL_COMMUNITY): Payer: Medicare Other

## 2018-12-01 LAB — CBC
HCT: 33.3 % — ABNORMAL LOW (ref 39.0–52.0)
Hemoglobin: 10.4 g/dL — ABNORMAL LOW (ref 13.0–17.0)
MCH: 28.8 pg (ref 26.0–34.0)
MCHC: 31.2 g/dL (ref 30.0–36.0)
MCV: 92.2 fL (ref 80.0–100.0)
Platelets: 239 10*3/uL (ref 150–400)
RBC: 3.61 MIL/uL — ABNORMAL LOW (ref 4.22–5.81)
RDW: 14.1 % (ref 11.5–15.5)
WBC: 9.4 10*3/uL (ref 4.0–10.5)
nRBC: 0 % (ref 0.0–0.2)

## 2018-12-01 LAB — BASIC METABOLIC PANEL WITH GFR
Anion gap: 11 (ref 5–15)
BUN: 25 mg/dL — ABNORMAL HIGH (ref 8–23)
CO2: 25 mmol/L (ref 22–32)
Calcium: 8.6 mg/dL — ABNORMAL LOW (ref 8.9–10.3)
Chloride: 100 mmol/L (ref 98–111)
Creatinine, Ser: 1.71 mg/dL — ABNORMAL HIGH (ref 0.61–1.24)
GFR calc Af Amer: 43 mL/min — ABNORMAL LOW (ref >60)
GFR calc non Af Amer: 37 mL/min — ABNORMAL LOW (ref >60)
Glucose, Bld: 171 mg/dL — ABNORMAL HIGH (ref 70–99)
Potassium: 3.7 mmol/L (ref 3.5–5.1)
Sodium: 136 mmol/L (ref 135–145)

## 2018-12-01 MED ORDER — LEVALBUTEROL HCL 0.63 MG/3ML IN NEBU
0.6300 mg | INHALATION_SOLUTION | Freq: Four times a day (QID) | RESPIRATORY_TRACT | Status: DC
  Administered 2018-12-01 – 2018-12-02 (×5): 0.63 mg via RESPIRATORY_TRACT
  Filled 2018-12-01 (×5): qty 3

## 2018-12-01 MED ORDER — GUAIFENESIN ER 600 MG PO TB12
1200.0000 mg | ORAL_TABLET | Freq: Two times a day (BID) | ORAL | Status: DC
  Administered 2018-12-01 – 2018-12-03 (×5): 1200 mg via ORAL
  Filled 2018-12-01 (×4): qty 2

## 2018-12-01 NOTE — Progress Notes (Addendum)
      [address]       [place] [address]             [phone]      5 Days Post-Op Procedure(s) (LRB): LEFT VIDEO ASSISTED THORACOSCOPY (VATS)/WEDGE RESECTION (Left) Left Lateral Chest Tube Insertion (Left)   Subjective:  Patient doing okay.  Has some pain.  Difficulty coughing up sputum.  Objective: Vital signs in last 24 hours: Temp:  [97.7 F (36.5 C)-98.9 F (37.2 C)] 97.8 F (36.6 C) (02/29 0750) Pulse Rate:  [69-95] 75 (02/29 0750) Cardiac Rhythm: Normal sinus rhythm (02/28 2008) Resp:  [17-24] 24 (02/29 0750) BP: (95-134)/(65-114) 134/114 (02/29 0750) SpO2:  [91 %-100 %] 91 % (02/29 0750)  Intake/Output from previous day: 02/28 0701 - 02/29 0700 In: 480 [P.O.:480] Out: 0  Intake/Output this shift: Total I/O In: 240 [P.O.:240] Out: 0   General appearance: alert, cooperative and no distress, appears ill Heart: regular rate and rhythm Lungs: diminished breath sounds bibasilar Abdomen: soft, non-tender; bowel sounds normal; no masses,  no organomegaly Extremities: extremities normal, atraumatic, no cyanosis or edema Wound: clean and dry  Lab Results: No results for input(s): WBC, HGB, HCT, PLT in the last 72 hours. BMET:  Recent Labs    11/29/18 0121 11/30/18 0230  NA 135 134*  K 4.6 4.3  CL 99 98  CO2 27 26  GLUCOSE 136* 106*  BUN 29* 28*  CREATININE 1.75* 1.76*  CALCIUM 8.4* 8.3*    PT/INR: No results for input(s): LABPROT, INR in the last 72 hours. ABG    Component Value Date/Time   PHART 7.357 11/27/2018 0300   HCO3 24.8 11/27/2018 0300   TCO2 25 05/15/2012 2011   O2SAT 96.7 11/27/2018 0300   CBG (last 3)  No results for input(s): GLUCAP in the last 72 hours.  Assessment/Plan: S/P Procedure(s) (LRB): LEFT VIDEO ASSISTED THORACOSCOPY (VATS)/WEDGE RESECTION (Left) Left Lateral Chest Tube Insertion (Left)  1. Chest tube- despite order was on suction yesterday, no air leak this morning, place on water seal myself,  repeat CXR in AM 2. Pulm- wean oxygen as tolerated, add Mucinex for secretions, will change nebs to scheduled 3. CV- hemodynamically stable, continue Lopressor, Norvasc 4. Renal- creatinine baseline is 1.7, on lasix, will repeat BMET in AM 5. Dispo- place chest tube to water seal, repeat CXR in AM, aggressive pulmonary toilet, continue current care   LOS: 5 days    Steve [name] 12/01/2018 Sat ok, still weak Labs pending , added for today Chest tube to water seal I have seen and examined Steve [name] and agree with the above assessment  and plan.  Grace Isaac MD [facility] [phone] Office [phone] 12/01/2018 10:52 AM

## 2018-12-02 ENCOUNTER — Inpatient Hospital Stay (HOSPITAL_COMMUNITY): Payer: Medicare Other

## 2018-12-02 MED ORDER — LEVALBUTEROL HCL 0.63 MG/3ML IN NEBU: 0.6300 mg | INHALATION_SOLUTION | Freq: Four times a day (QID) | RESPIRATORY_TRACT | Status: DC | PRN

## 2018-12-02 MED ORDER — LEVALBUTEROL HCL 0.63 MG/3ML IN NEBU
0.6300 mg | INHALATION_SOLUTION | Freq: Two times a day (BID) | RESPIRATORY_TRACT | Status: DC
  Administered 2018-12-02 – 2018-12-03 (×2): 0.63 mg via RESPIRATORY_TRACT
  Filled 2018-12-02 (×2): qty 3

## 2018-12-02 NOTE — Progress Notes (Addendum)
[address]       [place] [address]             [phone]       6 Days Post-Op Procedure(s) (LRB): LEFT VIDEO ASSISTED THORACOSCOPY (VATS)/WEDGE RESECTION (Left) Left Lateral Chest Tube Insertion (Left)   Subjective:  No new complaints.  Feeling better sitting up in chair.  He states he tugged on his chest tube last night which made some discomfort along his chest tube site.  Objective: Vital signs in last 24 hours: Temp:  [97.7 F (36.5 C)-98.8 F (37.1 C)] 98.8 F (37.1 C) (03/01 0804) Pulse Rate:  [33-88] 77 (03/01 0804) Cardiac Rhythm: Normal sinus rhythm (03/01 0800) Resp:  [20-28] 20 (03/01 0804) BP: (109-125)/(58-73) 125/73 (03/01 0804) SpO2:  [93 %-99 %] 95 % (03/01 0804) FiO2 (%):  [28 %] 28 % (03/01 0804)  Intake/Output from previous day: 02/29 0701 - 03/01 0700 In: 43 [P.O.:960] Out: 100 [Chest Tube:100] Intake/Output this shift: Total I/O In: 240 [P.O.:240] Out: -   General appearance: alert, cooperative and no distress Heart: regular rate and rhythm Lungs: coarse bilaterally Abdomen: soft, non-tender; bowel sounds normal; no masses,  no organomegaly Extremities: extremities normal, atraumatic, no cyanosis or edema Wound: clean and dry, chest tube was backed out of chest some  Lab Results: Recent Labs    12/01/18 1147  WBC 9.4  HGB 10.4*  HCT 33.3*  PLT 239   BMET:  Recent Labs    11/30/18 0230 12/01/18 1147  NA 134* 136  K 4.3 3.7  CL 98 100  CO2 26 25  GLUCOSE 106* 171*  BUN 28* 25*  CREATININE 1.76* 1.71*  CALCIUM 8.3* 8.6*    PT/INR: No results for input(s): LABPROT, INR in the last 72 hours. ABG    Component Value Date/Time   PHART 7.357 11/27/2018 0300   HCO3 24.8 11/27/2018 0300   TCO2 25 05/15/2012 2011   O2SAT 96.7 11/27/2018 0300   CBG (last 3)  No results for input(s): GLUCAP in the last 72 hours.  Assessment/Plan: S/P Procedure(s) (LRB): LEFT VIDEO ASSISTED THORACOSCOPY (VATS)/WEDGE  RESECTION (Left) Left Lateral Chest Tube Insertion (Left)  1. Chest tube- no air leak, can possibly d/c chest tube today, will discuss with Dr. Servando Snare 2. Pulm- CXR with new lateral sub q emphysema- patient stepped on CT last night, it pulled, the chest tube had advanced out of chest some, which is likley cause, wean oxygen as tolerated 3. CV- hemodynamically stable on Lopressor, Norvasc 4. Renal- creatinine remains stable 5. Dispo- patient stable, no air leak present, patient stepped on chest tube last night which is likely reason for sub q air as chest tube was slightly advanced out of chest, possibly d/c chest tube today, continue current care   LOS: 6 days    Ellwood Handler 12/02/2018  Dg Chest Port 1 View  Result Date: 12/02/2018 CLINICAL DATA:  Post partial lobectomy on the left lung. EXAM: PORTABLE CHEST 1 VIEW COMPARISON:  12/01/2018; 11/30/2018; 10/18/2018; chest CT-11/16/2018 FINDINGS: Grossly unchanged enlarged cardiac silhouette and mediastinal contours with atherosclerotic plaque within thoracic aorta. Stable positioning support apparatus. No pneumothorax however there has been development of a small amount of right lateral chest wall subcutaneous emphysema. Unchanged left apical pleuroparenchymal thickening. Mild pulmonary venous congestion, most conspicuously involving the left lung. No definite pleural effusion. No acute osseous abnormalities. IMPRESSION: 1. Stable positioning of support apparatus. No pneumothorax, though there has been development of a  small amount of left lateral chest wall subcutaneous emphysema. 2. Pulmonary venous congestion, most conspicuously involving the left lung without definitive acute cardiopulmonary disease. Electronically Signed   By: Sandi Mariscal M.D.   On: 12/02/2018 07:58   Chronic Kidney Disease   Stage I     GFR >90  Stage II    GFR 60-89  Stage IIIA GFR 45-59  Stage IIIB GFR 30-44  Stage IV   GFR 15-29  Stage V    GFR  <15  Lab Results    Component Value Date   CREATININE 1.71 (H) 12/01/2018   Estimated Creatinine Clearance: 37.8 mL/min (A) (by C-G formula based on SCr of 1.71 mg/dL (H)).  Chest xray reviewed, no air leak, d/c chest tube today Cr stable at baseline . Stage IIIb ckd I have seen and examined Janine Ores and agree with the above assessment  and plan.  Grace Isaac MD [facility] [phone] Office [phone] 12/02/2018 10:51 AM

## 2018-12-02 NOTE — Progress Notes (Signed)
Pt weaned to room air @ 12 noon. Sats remained >90% @ rest. Pt walked 200 feet in hallway on room air. Sats dropped to 86% . Pt 's sats returned to >90% while sitting and on room air.

## 2018-12-03 ENCOUNTER — Inpatient Hospital Stay (HOSPITAL_COMMUNITY): Payer: Medicare Other

## 2018-12-03 MED ORDER — OXYCODONE HCL 5 MG PO TABS: 5.0000 mg | ORAL_TABLET | Freq: Four times a day (QID) | ORAL | 0 refills | Status: DC | PRN

## 2018-12-03 NOTE — Progress Notes (Signed)
7 Days Post-Op Procedure(s) (LRB): LEFT VIDEO ASSISTED THORACOSCOPY (VATS)/WEDGE RESECTION (Left) Left Lateral Chest Tube Insertion (Left) Subjective: No complaints this AM  Objective: Vital signs in last 24 hours: Temp:  [98 F (36.7 C)-98.8 F (37.1 C)] 98.1 F (36.7 C) (03/02 0732) Pulse Rate:  [70-94] 71 (03/02 0701) Cardiac Rhythm: Normal sinus rhythm (03/01 1935) Resp:  [18-20] 18 (03/02 0732) BP: (109-125)/(50-73) 113/54 (03/02 0732) SpO2:  [88 %-96 %] 92 % (03/02 0701) FiO2 (%):  [28 %] 28 % (03/01 0804)  Hemodynamic parameters for last 24 hours:    Intake/Output from previous day: 03/01 0701 - 03/02 0700 In: 920 [P.O.:920] Out: 300 [Urine:300] Intake/Output this shift: No intake/output data recorded.  General appearance: alert, cooperative and no distress Neurologic: intact Heart: regular rate and rhythm Lungs: clear to auscultation bilaterally Wound: clean and dry  Lab Results: Recent Labs    12/01/18 1147  WBC 9.4  HGB 10.4*  HCT 33.3*  PLT 239   BMET:  Recent Labs    12/01/18 1147  NA 136  K 3.7  CL 100  CO2 25  GLUCOSE 171*  BUN 25*  CREATININE 1.71*  CALCIUM 8.6*    PT/INR: No results for input(s): LABPROT, INR in the last 72 hours. ABG    Component Value Date/Time   PHART 7.357 11/27/2018 0300   HCO3 24.8 11/27/2018 0300   TCO2 25 05/15/2012 2011   O2SAT 96.7 11/27/2018 0300   CBG (last 3)  No results for input(s): GLUCAP in the last 72 hours.  Assessment/Plan: S/P Procedure(s) (LRB): LEFT VIDEO ASSISTED THORACOSCOPY (VATS)/WEDGE RESECTION (Left) Left Lateral Chest Tube Insertion (Left) Plan for discharge: see discharge orders  Doing well Dc home later today   LOS: 7 days    Steve [name] 12/03/2018

## 2018-12-03 NOTE — Progress Notes (Signed)
Discharged home accompanied by wife and daughter, discharge instructions given to pt. Belongings taken home.

## 2018-12-03 NOTE — Progress Notes (Signed)
02 sats during the night dropped down to 85-87%. Was placed on 02 at 1 L and sats came up to 93-97%.   Went down to x ray this am on room air. Returned to room, 02 sat 90%. No distress noted at this time. Patient remains on room air at this time.

## 2018-12-03 NOTE — Care Management Important Message (Signed)
Important Message  Patient Details  Name: Steve [name] MRN: [id number] Date of Birth: [birth year]   Medicare Important Message Given:  Yes    [name] 12/03/2018, 1:24 PM

## 2018-12-06 ENCOUNTER — Other Ambulatory Visit: Payer: Self-pay | Admitting: *Deleted

## 2018-12-06 ENCOUNTER — Encounter: Payer: Self-pay | Admitting: *Deleted

## 2018-12-06 NOTE — Progress Notes (Signed)
Oncology Nurse Navigator Documentation  Oncology Nurse Navigator Flowsheets 12/06/2018  Navigator Location [facility]  Navigator Encounter Type Other/patient's case was discussed at thoracic cancer conference 12/06/2018.  Per recommendations foundation one and PDL 1 testing requested   Abnormal Finding Date 07/27/2018  Confirmed Diagnosis Date 11/26/2018  Surgery Date 11/26/2018  Treatment Initiated Date 11/26/2018  Barriers/Navigation Needs Coordination of Care  Interventions Coordination of Care  Coordination of Care Other  Acuity Level 1  Time Spent with Patient 15

## 2018-12-06 NOTE — Progress Notes (Signed)
The proposed treatment discussed in cancer conference 12/06/2018 is for discussion purpose only and not a binding recommendation.  The patient was not physically examined nor present for their treatment options.  Therefore, final treatment plans cannot be decided.

## 2018-12-17 ENCOUNTER — Other Ambulatory Visit: Payer: Self-pay | Admitting: *Deleted

## 2018-12-17 DIAGNOSIS — R911 Solitary pulmonary nodule: Secondary | ICD-10-CM

## 2018-12-17 NOTE — Progress Notes (Unsigned)
Dg  

## 2018-12-18 ENCOUNTER — Other Ambulatory Visit: Payer: Self-pay

## 2018-12-18 ENCOUNTER — Encounter: Payer: Self-pay | Admitting: Thoracic Surgery (Cardiothoracic Vascular Surgery)

## 2018-12-18 ENCOUNTER — Ambulatory Visit (INDEPENDENT_AMBULATORY_CARE_PROVIDER_SITE_OTHER): Payer: Self-pay | Admitting: Thoracic Surgery (Cardiothoracic Vascular Surgery)

## 2018-12-18 ENCOUNTER — Ambulatory Visit
Admission: RE | Admit: 2018-12-18 | Discharge: 2018-12-18 | Disposition: A | Discharge status: Home / Self Care | Payer: Medicare Other | Source: Ambulatory Visit | Attending: Thoracic Surgery (Cardiothoracic Vascular Surgery) | Admitting: Thoracic Surgery (Cardiothoracic Vascular Surgery)

## 2018-12-18 VITALS — BP 104/69 | HR 69 | Resp 16 | Ht 67.0 in | Wt 205.0 lb

## 2018-12-18 DIAGNOSIS — Z5332 Thoracoscopic surgical procedure converted to open procedure: Secondary | ICD-10-CM | POA: Diagnosis not present

## 2018-12-18 DIAGNOSIS — Z902 Acquired absence of lung [part of]: Secondary | ICD-10-CM

## 2018-12-18 DIAGNOSIS — R911 Solitary pulmonary nodule: Secondary | ICD-10-CM

## 2018-12-18 NOTE — Progress Notes (Signed)
[address]       [place] [address]             [phone]    HPI: [name] returns for a scheduled follow-up visit  Steve [name] is an 78 year old gentleman with a history of tobacco abuse, hypertension, hyperlipidemia, prostate cancer, bladder neck contracture, urethral stricture, obstructive sleep apnea, colon cancer, and a 4.5 cm ascending aneurysm.  He was first noted to have a lung nodule in 2018.  It was 3 mm at that time.  On a follow-up CT to reevaluate his a sending aneurysm and increased to 12 mm.  I recommended surgical resection.  On PET CT the lesion was hypermetabolic but there also was a hypermetabolic lesion in the cecum.  He underwent colon resection in December in [place].  Once he finally recovered from that he return for management of his lung nodule.  I did a left VATS for extrapleural wedge resection on 11/26/2018.  The initial plan was for an anatomic resection, but he had severe adhesions which precluded that.  He had air leak initially which resolved and he went home on day 7.  Pathology showed adenocarcinoma with invasion of the parietal pleura- T3NX.  Since discharge he has been doing well.  He is not having much pain.  He said this was much easier than his colon surgery.  He is not having any difficulty with his breathing.  He is not taking any narcotics.  Past Medical History:  Diagnosis Date  . Bladder neck contracture   . Complication of anesthesia   . COPD (chronic obstructive pulmonary disease) ([place]) PER CXRAY  10-28-2010  . Frequency of urination   . History of prostate cancer S/P SEED IMPLANTS   . History of urethral stricture   . History of urinary retention   . Nocturia   . OSA (obstructive sleep apnea) NON-COMPLIANT   does not use cpap  . Peyronie disease   . PONV (postoperative nausea and vomiting)   . Urinary incontinence     Current Outpatient Medications  Medication Sig Dispense Refill  . amLODipine (NORVASC) 5 MG  tablet TAKE 1 TABLET ONCE DAILY. (Patient taking differently: Take 5 mg by mouth daily. TAKE 1 TABLET ONCE DAILY.) 90 tablet 0  . aspirin EC 81 MG tablet Take 81 mg by mouth every other day.     Marland Kitchen atorvastatin (LIPITOR) 40 MG tablet TAKE (1) TABLET BY MOUTH ONCE DAILY. (Patient taking differently: Take 40 mg by mouth every evening. ) 90 tablet 1  . furosemide (LASIX) 40 MG tablet Take 1 tablet (40 mg total) by mouth daily. 90 tablet 3  . metoprolol tartrate (LOPRESSOR) 25 MG tablet Take 12.5 mg by mouth every evening.     . potassium chloride SA (K-DUR,KLOR-CON) 20 MEQ tablet Take 1 tablet (20 mEq total) by mouth daily. 90 tablet 3   No current facility-administered medications for this visit.     Physical Exam BP 104/69 (BP Location: Right Arm, Patient Position: Sitting, Cuff Size: Large)   Pulse 69   Resp 16   Ht 5\' 7"  (1.702 m)   Wt 205 lb (93 kg)   SpO2 93% Comment: RA  BMI 32.8 kg/m  78 year old man in no acute distress Alert and oriented x3 with no focal deficits Cardiac regular rate and rhythm Lungs clear with equal breath sounds bilaterally Incision healing well, minimal erythema at chest tube site Abdominal incision well-healed  Diagnostic Tests: CHEST -  2 VIEW  COMPARISON:  12/03/2018 and earlier.  FINDINGS: Larger lung volumes. Left upper lobe staple line and asymmetrically decreased volume in the left lung. Stable cardiac size and mediastinal contours.  No pneumothorax, pulmonary edema or acute pulmonary opacity. Mild bilateral pleural scarring suspected. No definite effusion. Resolved left chest wall subcutaneous gas.  No acute osseous abnormality identified. Negative visible bowel gas pattern.  IMPRESSION: Postoperative changes to the left lung with No acute cardiopulmonary abnormality.   Electronically Signed   By: Steve [name] M.D.   On: 12/18/2018 09:13   Impression: Steve [name] is an 78 year old gentleman with a history of tobacco abuse,  hypertension, hyperlipidemia, prostate cancer, bladder neck contracture, urethral stricture, obstructive sleep apnea, colon cancer, and a 4.5 cm ascending aneurysm.  He recently has had surgery for colon cancer and also had a wedge resection for a T3NX adenocarcinoma of the left upper lobe.  He did well from the standpoint of his wedge resection.  He may resume normal activities.  He was cautioned to build into new activities gradually.  He needs to see an oncologist.  He was not referred to one after his colon surgery.  He prefers to go to any [facility] so I am going to schedule him to see Dr. Delton [name] there.   Plan: Referral to Dr. Delton [name] at [facility] I will see him back in 2 months with a PA and lateral chest x-ray to check on his progress  Steve Nakayama, MD [facility] [phone]

## 2018-12-19 ENCOUNTER — Encounter (HOSPITAL_COMMUNITY): Payer: Self-pay | Admitting: Internal Medicine

## 2018-12-21 ENCOUNTER — Encounter (HOSPITAL_COMMUNITY): Payer: Self-pay | Admitting: Internal Medicine

## 2018-12-26 ENCOUNTER — Ambulatory Visit (HOSPITAL_COMMUNITY): Payer: Medicare Other | Admitting: Hematology

## 2019-01-16 ENCOUNTER — Ambulatory Visit (HOSPITAL_COMMUNITY): Payer: Medicare Other

## 2019-01-22 DIAGNOSIS — M9901 Segmental and somatic dysfunction of cervical region: Secondary | ICD-10-CM | POA: Diagnosis not present

## 2019-01-22 DIAGNOSIS — M9902 Segmental and somatic dysfunction of thoracic region: Secondary | ICD-10-CM | POA: Diagnosis not present

## 2019-01-22 DIAGNOSIS — M47816 Spondylosis without myelopathy or radiculopathy, lumbar region: Secondary | ICD-10-CM | POA: Diagnosis not present

## 2019-01-22 DIAGNOSIS — M5136 Other intervertebral disc degeneration, lumbar region: Secondary | ICD-10-CM | POA: Diagnosis not present

## 2019-01-22 DIAGNOSIS — M5440 Lumbago with sciatica, unspecified side: Secondary | ICD-10-CM | POA: Diagnosis not present

## 2019-01-23 ENCOUNTER — Other Ambulatory Visit: Payer: Self-pay | Admitting: Cardiovascular Disease

## 2019-01-24 DIAGNOSIS — M5440 Lumbago with sciatica, unspecified side: Secondary | ICD-10-CM | POA: Diagnosis not present

## 2019-01-24 DIAGNOSIS — M47816 Spondylosis without myelopathy or radiculopathy, lumbar region: Secondary | ICD-10-CM | POA: Diagnosis not present

## 2019-01-24 DIAGNOSIS — M9903 Segmental and somatic dysfunction of lumbar region: Secondary | ICD-10-CM | POA: Diagnosis not present

## 2019-01-24 DIAGNOSIS — M5136 Other intervertebral disc degeneration, lumbar region: Secondary | ICD-10-CM | POA: Diagnosis not present

## 2019-01-28 ENCOUNTER — Ambulatory Visit: Payer: Medicare Other | Admitting: Cardiovascular Disease

## 2019-01-28 DIAGNOSIS — M9903 Segmental and somatic dysfunction of lumbar region: Secondary | ICD-10-CM | POA: Diagnosis not present

## 2019-01-28 DIAGNOSIS — M5440 Lumbago with sciatica, unspecified side: Secondary | ICD-10-CM | POA: Diagnosis not present

## 2019-01-28 DIAGNOSIS — M5136 Other intervertebral disc degeneration, lumbar region: Secondary | ICD-10-CM | POA: Diagnosis not present

## 2019-01-28 DIAGNOSIS — M47816 Spondylosis without myelopathy or radiculopathy, lumbar region: Secondary | ICD-10-CM | POA: Diagnosis not present

## 2019-02-05 DIAGNOSIS — M545 Low back pain: Secondary | ICD-10-CM | POA: Diagnosis not present

## 2019-02-08 DIAGNOSIS — M47816 Spondylosis without myelopathy or radiculopathy, lumbar region: Secondary | ICD-10-CM | POA: Diagnosis not present

## 2019-02-08 DIAGNOSIS — C3490 Malignant neoplasm of unspecified part of unspecified bronchus or lung: Secondary | ICD-10-CM | POA: Diagnosis not present

## 2019-02-08 DIAGNOSIS — C7951 Secondary malignant neoplasm of bone: Secondary | ICD-10-CM | POA: Diagnosis not present

## 2019-02-08 DIAGNOSIS — M545 Low back pain: Secondary | ICD-10-CM | POA: Diagnosis not present

## 2019-02-08 DIAGNOSIS — M4854XA Collapsed vertebra, not elsewhere classified, thoracic region, initial encounter for fracture: Secondary | ICD-10-CM | POA: Diagnosis not present

## 2019-02-08 DIAGNOSIS — M4856XA Collapsed vertebra, not elsewhere classified, lumbar region, initial encounter for fracture: Secondary | ICD-10-CM | POA: Diagnosis not present

## 2019-02-12 ENCOUNTER — Ambulatory Visit: Payer: Medicare Other | Admitting: Thoracic Surgery (Cardiothoracic Vascular Surgery)

## 2019-02-18 ENCOUNTER — Inpatient Hospital Stay (HOSPITAL_COMMUNITY): Payer: Medicare Other | Attending: Hematology | Admitting: Hematology

## 2019-02-18 ENCOUNTER — Inpatient Hospital Stay (HOSPITAL_COMMUNITY): Payer: Medicare Other

## 2019-02-18 ENCOUNTER — Encounter (HOSPITAL_COMMUNITY): Payer: Self-pay | Admitting: Hematology

## 2019-02-18 ENCOUNTER — Other Ambulatory Visit: Payer: Self-pay

## 2019-02-18 VITALS — BP 129/75 | HR 94 | Temp 98.5°F | Resp 18 | Ht 67.0 in | Wt 184.1 lb

## 2019-02-18 DIAGNOSIS — C3412 Malignant neoplasm of upper lobe, left bronchus or lung: Secondary | ICD-10-CM | POA: Insufficient documentation

## 2019-02-18 DIAGNOSIS — C61 Malignant neoplasm of prostate: Secondary | ICD-10-CM

## 2019-02-18 DIAGNOSIS — Z9049 Acquired absence of other specified parts of digestive tract: Secondary | ICD-10-CM

## 2019-02-18 DIAGNOSIS — Z8546 Personal history of malignant neoplasm of prostate: Secondary | ICD-10-CM | POA: Insufficient documentation

## 2019-02-18 DIAGNOSIS — Z800 Family history of malignant neoplasm of digestive organs: Secondary | ICD-10-CM | POA: Insufficient documentation

## 2019-02-18 DIAGNOSIS — G893 Neoplasm related pain (acute) (chronic): Secondary | ICD-10-CM | POA: Diagnosis not present

## 2019-02-18 DIAGNOSIS — G4733 Obstructive sleep apnea (adult) (pediatric): Secondary | ICD-10-CM | POA: Diagnosis not present

## 2019-02-18 DIAGNOSIS — J449 Chronic obstructive pulmonary disease, unspecified: Secondary | ICD-10-CM | POA: Diagnosis not present

## 2019-02-18 DIAGNOSIS — C3490 Malignant neoplasm of unspecified part of unspecified bronchus or lung: Secondary | ICD-10-CM

## 2019-02-18 DIAGNOSIS — M1990 Unspecified osteoarthritis, unspecified site: Secondary | ICD-10-CM | POA: Insufficient documentation

## 2019-02-18 DIAGNOSIS — I10 Essential (primary) hypertension: Secondary | ICD-10-CM | POA: Diagnosis not present

## 2019-02-18 DIAGNOSIS — C189 Malignant neoplasm of colon, unspecified: Secondary | ICD-10-CM | POA: Diagnosis not present

## 2019-02-18 DIAGNOSIS — E785 Hyperlipidemia, unspecified: Secondary | ICD-10-CM | POA: Diagnosis not present

## 2019-02-18 DIAGNOSIS — Z7982 Long term (current) use of aspirin: Secondary | ICD-10-CM | POA: Insufficient documentation

## 2019-02-18 DIAGNOSIS — Z79899 Other long term (current) drug therapy: Secondary | ICD-10-CM | POA: Insufficient documentation

## 2019-02-18 DIAGNOSIS — R531 Weakness: Secondary | ICD-10-CM

## 2019-02-18 DIAGNOSIS — C3492 Malignant neoplasm of unspecified part of left bronchus or lung: Secondary | ICD-10-CM | POA: Insufficient documentation

## 2019-02-18 DIAGNOSIS — Z87891 Personal history of nicotine dependence: Secondary | ICD-10-CM | POA: Diagnosis not present

## 2019-02-18 DIAGNOSIS — M549 Dorsalgia, unspecified: Secondary | ICD-10-CM | POA: Diagnosis not present

## 2019-02-18 DIAGNOSIS — C7951 Secondary malignant neoplasm of bone: Secondary | ICD-10-CM | POA: Insufficient documentation

## 2019-02-18 DIAGNOSIS — R32 Unspecified urinary incontinence: Secondary | ICD-10-CM | POA: Insufficient documentation

## 2019-02-18 DIAGNOSIS — K5900 Constipation, unspecified: Secondary | ICD-10-CM | POA: Diagnosis not present

## 2019-02-18 LAB — CBC WITH DIFFERENTIAL/PLATELET
Abs Immature Granulocytes: 0.15 10*3/uL — ABNORMAL HIGH (ref 0.00–0.07)
Basophils Absolute: 0.1 10*3/uL (ref 0.0–0.1)
Basophils Relative: 0 %
Eosinophils Absolute: 0.3 10*3/uL (ref 0.0–0.5)
Eosinophils Relative: 2 %
HCT: 38.3 % — ABNORMAL LOW (ref 39.0–52.0)
Hemoglobin: 12.1 g/dL — ABNORMAL LOW (ref 13.0–17.0)
Immature Granulocytes: 1 %
Lymphocytes Relative: 6 %
Lymphs Abs: 1.2 10*3/uL (ref 0.7–4.0)
MCH: 27.8 pg (ref 26.0–34.0)
MCHC: 31.6 g/dL (ref 30.0–36.0)
MCV: 88 fL (ref 80.0–100.0)
Monocytes Absolute: 1.5 10*3/uL — ABNORMAL HIGH (ref 0.1–1.0)
Monocytes Relative: 8 %
Neutro Abs: 16.2 10*3/uL — ABNORMAL HIGH (ref 1.7–7.7)
Neutrophils Relative %: 83 %
Platelets: 239 10*3/uL (ref 150–400)
RBC: 4.35 MIL/uL (ref 4.22–5.81)
RDW: 13.8 % (ref 11.5–15.5)
WBC: 19.5 10*3/uL — ABNORMAL HIGH (ref 4.0–10.5)
nRBC: 0 % (ref 0.0–0.2)

## 2019-02-18 LAB — COMPREHENSIVE METABOLIC PANEL
ALT: 22 U/L (ref 0–44)
AST: 34 U/L (ref 15–41)
Albumin: 3 g/dL — ABNORMAL LOW (ref 3.5–5.0)
Alkaline Phosphatase: 194 U/L — ABNORMAL HIGH (ref 38–126)
Anion gap: 13 (ref 5–15)
BUN: 39 mg/dL — ABNORMAL HIGH (ref 8–23)
CO2: 19 mmol/L — ABNORMAL LOW (ref 22–32)
Calcium: 9.2 mg/dL (ref 8.9–10.3)
Chloride: 101 mmol/L (ref 98–111)
Creatinine, Ser: 1.98 mg/dL — ABNORMAL HIGH (ref 0.61–1.24)
GFR calc Af Amer: 36 mL/min — ABNORMAL LOW (ref >60)
GFR calc non Af Amer: 31 mL/min — ABNORMAL LOW (ref >60)
Glucose, Bld: 141 mg/dL — ABNORMAL HIGH (ref 70–99)
Potassium: 4.3 mmol/L (ref 3.5–5.1)
Sodium: 133 mmol/L — ABNORMAL LOW (ref 135–145)
Total Bilirubin: 0.8 mg/dL (ref 0.3–1.2)
Total Protein: 7.7 g/dL (ref 6.5–8.1)

## 2019-02-18 LAB — PSA: Prostatic Specific Antigen: <0.01 ng/mL (ref 0.00–4.00)

## 2019-02-18 NOTE — Assessment & Plan Note (Addendum)
1.  Metastatic adenocarcinoma of the lung: -PDL 1 TPS-30%, foundation 1 shows MS-stable, TMB 5 Muts/Mb, K-ras G12V - Patient followed for left lung nodule, PET scan in November 2019 showed a left lung nodule (T1b N0 M0) with focal area of increased radiotracer uptake in the cecum. -Patient underwent right colectomy in December 2019, found to have stage I colon cancer. - He underwent left upper lobe lung wedge biopsy/resection on 11/26/2018.  This showed 2.8 cm poorly differentiated adenocarcinoma with visceral pleural involvement and direct invasion of the parietal pleura. - Patient has been getting progressively weak and his legs for the last 3 to 4 weeks.  He also developed back pain.  His PMD Dr. Quillian Quince ordered a MRI of the lumbar spine without contrast on 02/08/2019.  This showed metastatic disease involving the T12, L3, S1, S2 and S3 vertebral bodies as well as the left ilium.  At T12 there is probable epidural soft tissue tumor component along the right paracentral aspect extending into the right neural foramina.  At L3 there is a small epidural tumor component extending into the right paracentral/L2-3 foraminal aspect.  At S2 there is small volume epidural soft tissue component.  Pathological compression fractures of T12 and L3 bodies. -Patient had a remote history of prostate cancer, treated with radiation and seed implants more than 10 years ago.  Will obtain a PSA level today. - I have recommended staging MRI of the brain as well as a whole-body PET CT scan. -I have reached out to [facility] who kindly agreed to see this patient in the next 1-2 days.  He will also review MRI films.  If there is a strong suspicion of epidural mass at T12, we will consider referring him to neurosurgery.  2.  Stage I Colon Adenocarcinoma:  - Status post right hemicolectomy on 09/18/2018, pathology showing T1 N0 M0 colon adenocarcinoma.  3.  Cancer related pain: - Dr. Olena Heckle started him on hydrocodone for his  back pain which is controlling it. - We are making a referral to radiation oncology.

## 2019-02-18 NOTE — Patient Instructions (Signed)
[place] at [facility] Discharge Instructions  You were seen today by Dr. Delton Coombes. He went over your history, family history and how you've been lately. He also went over your scan and lab results. He would like for you to have another PET scan and an MRI of your brain. He will see you back after your scans for follow up.   Thank you for choosing [place] at [facility] to provide your oncology and hematology care.  To afford each patient quality time with our provider, please arrive at least 15 minutes before your scheduled appointment time.   If you have a lab appointment with the [facility] please come in thru the  Main Entrance and check in at the main information desk  You need to re-schedule your appointment should you arrive 10 or more minutes late.  We strive to give you quality time with our providers, and arriving late affects you and other patients whose appointments are after yours.  Also, if you no show three or more times for appointments you may be dismissed from the clinic at the providers discretion.     Again, thank you for choosing [facility].  Our hope is that these requests will decrease the amount of time that you wait before being seen by our physicians.       _____________________________________________________________  Should you have questions after your visit to [facility], please contact our office at [phone] between the hours of 8:00 a.m. and 4:30 p.m.  Voicemails left after 4:00 p.m. will not be returned until the following business day.  For prescription refill requests, have your pharmacy contact our office and allow 72 hours.    Cancer Center Support Programs:   > Cancer Support Group  2nd Tuesday of the month 1pm-2pm, Journey Room

## 2019-02-18 NOTE — Progress Notes (Signed)
[facility] Initial Visit:  Patient Care Team: Caryl Bis, MD as PCP - General Herminio Commons, MD as PCP - Cardiology (Cardiology) Donetta Potts, RN as Oncology Nurse Navigator (Oncology)  CHIEF COMPLAINTS/PURPOSE OF CONSULTATION: Oncology consult for newly diagnosed Stage IIB lung adenocarcinoma and Stage I colon cancer.   HISTORY OF PRESENTING ILLNESS: Steve [name] 78 y.o. male with a history of tobacco abuse (40 pack years, quit 40 years ago), hypertension, hyperlipidemia, prostate cancer, bladder neck contracture, urethral stricture, obstructive sleep apnea, a 4.5 cm ascending aneurysm, and a recently resected Stage I colon cancer. He presents today for consult regarding newly diagnosed Stage IIB lung adenocarcinoma.  In October 2019,  pt was undergoing surveillance  CT angiogram for Hx of aortic aneurysm. Angiogram revealed a 14 mm left upper lobe peripheral spiculated nodule, previously 3 mm, suspicious for primary bronchogenic carcinoma. He was referred to Cardiac thoracic surgeon for evaluation. PET/CT was ordered and revealed: left upper lobe pulmonary nodule was hypermetabolic. There was also a focal area of increased radiotracer uptake localizing to the cecum with SUV max equal to 20.66, concerning for colon neoplasm.   Pt was worked up for cecal mass at [facility] underwent R hemicolectomy on 09/18/18. Pathology consistent with Stage IA colon adenocarcinoma, T1N0M0. Repeat CT chest preformed on 11/16/2018 suggest left upper lobe pulmonary nodule had enlarged. A new low-density lesion in the dome of the right hepatic lobe was noted concerning for metastasis. He was then scheduled for Left VATS with wedge resection LUL on 11/26/2018. Pathology consisted with adenocarcinoma, tumor:2.8 cm in size with invasion to attached pleural.   He reports development of lower back pain with RLE weakness that has progressed over the past 3-4 weeks. He was  evaluated by his PCP, who recommend MRI of lumbar spine with out contrast. MRI on 02/08/2019 revealed metastatic disease involving T12, L3, S1, S2,and S3 vertebra, as well as left ilium. There is also a concern for an epidural soft tissue tumor at T12 along the right paracentral aspect extending into the right neural foramen. There is a small epidural tumor at L3 extending along the right paracentral L2-3 foraminal aspec. There is also a small epidural tissue component at S2. Pathologic compression fractures noted at T12 and L3.  He is taking aleve and hydrocodone for his back pain with relief. He has RLE weakness. His performance status has continued to decline over the past 4 weeks according to is daughter. He is unable too ambulate with out assistance. He needs assistance with all ADLs.  Appetite is diminished without weight loss. Pt has long standing urinary incontinence secondary to prostate radiation.  He has a family history significant for pancreatic cancer in his mother and lung cancer in his father.   Review of Systems  Constitutional: Positive for appetite change.  HENT:  Negative.   Eyes: Negative.   Respiratory: Negative.   Cardiovascular: Negative.   Gastrointestinal: Positive for constipation.  Endocrine: Negative.   Genitourinary: Positive for bladder incontinence.   Musculoskeletal: Positive for back pain and gait problem.  Skin: Negative.   Neurological: Positive for gait problem.  Hematological: Negative.   Psychiatric/Behavioral: Negative.     MEDICAL HISTORY: Past Medical History:  Diagnosis Date  . Arthritis   . Bladder neck contracture   . Cataract   . Complication of anesthesia   . COPD (chronic obstructive pulmonary disease) ([place]) PER CXRAY  10-28-2010  . Frequency of urination   . History  of prostate cancer S/P SEED IMPLANTS   . History of urethral stricture   . History of urinary retention   . Hypertension   . Nocturia   . OSA (obstructive sleep apnea)  NON-COMPLIANT   does not use cpap  . Peyronie disease   . PONV (postoperative nausea and vomiting)   . Prostate cancer ([place])   . Urinary incontinence     SURGICAL HISTORY: Past Surgical History:  Procedure Laterality Date  . [place] VITRECTOMY WITH 20 GAUGE MVR PORT Right 12/11/2012   Procedure: 25 GAUGE PARS PLANA VITRECTOMY WITH 20 GAUGE MVR PORT;  Surgeon: Hayden Pedro, MD;  Location: [facility];  Service: Ophthalmology;  Laterality: Right;  Injection of silicone Oil  . CATARACT EXTRACTION W/ INTRAOCULAR LENS  IMPLANT, BILATERAL    . CHEST TUBE INSERTION Left 11/26/2018   Procedure: Left Lateral Chest Tube Insertion;  Surgeon: Melrose Nakayama, MD;  Location: [facility];  Service: Thoracic;  Laterality: Left;  . CYSTO/ URETHRAL DILATION  04-21-2008  . CYSTO/ URETHRAL DILATION/  URETHRA BALLOON DILATION / VAPORIZATION OF PROSTATE AND URETHRAL STRICTURE  10-28-2010  . CYSTOSCOPY  01/27/2012   Procedure: CYSTOSCOPY;  Surgeon: Ailene Rud, MD;  Location: [facility];  Service: Urology;  Laterality: N/A;  cysto, cook balloon dilation of  bladder neck contracture gyrus button vaporization  carm   . EYE SURGERY     cataract removed bilaterally  . GAS INSERTION  10/23/2012   Procedure: INSERTION OF GAS;  Surgeon: Hayden Pedro, MD;  Location: [facility];  Service: Ophthalmology;  Laterality: Right;  [id number]  . GAS/FLUID EXCHANGE Right 12/11/2012   Procedure: GAS/FLUID EXCHANGE;  Surgeon: Hayden Pedro, MD;  Location: [facility];  Service: Ophthalmology;  Laterality: Right;  . IRIDECTOMY Right 12/11/2012   Procedure: IRIDECTOMY;  Surgeon: Hayden Pedro, MD;  Location: [facility];  Service: Ophthalmology;  Laterality: Right;  . LASER PHOTO ABLATION Right 12/11/2012   Procedure: LASER PHOTO ABLATION;  Surgeon: Hayden Pedro, MD;  Location: [facility];  Service: Ophthalmology;  Laterality: Right;  . MEMBRANE PEEL Right 12/11/2012   Procedure: MEMBRANE PEEL;  Surgeon: Hayden Pedro, MD;  Location: [facility];  Service: Ophthalmology;  Laterality: Right;  . [place] INJECTION Right 12/11/2012   Procedure: PERFLUORONE INJECTION;  Surgeon: Hayden Pedro, MD;  Location: [facility];  Service: Ophthalmology;  Laterality: Right;  Injection and Removal  . PHOTOCOAGULATION WITH LASER  10/23/2012   Procedure: PHOTOCOAGULATION WITH LASER;  Surgeon: Hayden Pedro, MD;  Location: [facility];  Service: Ophthalmology;  Laterality: Right;  . [place] INJECTION  2003  . RADIOACTIVE SEED IMPLANTS, PROSTATE  06-20-2002  . SCLERAL BUCKLE  10/23/2012   Procedure: SCLERAL BUCKLE;  Surgeon: Hayden Pedro, MD;  Location: [facility];  Service: Ophthalmology;  Laterality: Right;  . TRANSURETHRAL RESECTION OF BLADDER TUMOR  05/15/2012   Procedure: TRANSURETHRAL RESECTION OF BLADDER TUMOR (TURBT);  Surgeon: Ailene Rud, MD;  Location: [facility];  Service: Urology;  Laterality: N/A;  . VIDEO ASSISTED THORACOSCOPY (VATS)/WEDGE RESECTION Left 11/26/2018   Procedure: LEFT VIDEO ASSISTED THORACOSCOPY (VATS)/WEDGE RESECTION;  Surgeon: Melrose Nakayama, MD;  Location: [facility];  Service: Thoracic;  Laterality: Left;    SOCIAL HISTORY: Social History   Socioeconomic History  . Marital status: Married    Spouse name: Not on file  . Number of children: Not on file  . Years of education: Not on file  . Highest  education level: Not on file  Occupational History  . Not on file  Social Needs  . Financial resource strain: Not hard at all  . Food insecurity:    Worry: Never true    Inability: Never true  . Transportation needs:    Medical: No    Non-medical: No  Tobacco Use  . Smoking status: Former Smoker    Packs/day: 2.00    Years: 20.00    Pack years: 40.00    Types: Cigarettes    Start date: 10/24/1955    Last attempt to quit: 01/24/1981    Years since quitting: 38.0  . Smokeless tobacco: Never Used  Substance and Sexual Activity  . Alcohol use: No    Alcohol/week: 0.0 standard  drinks  . Drug use: No  . Sexual activity: Not Currently  Lifestyle  . Physical activity:    Days per week: 0 days    Minutes per session: 0 min  . Stress: To some extent  Relationships  . Social connections:    Talks on phone: Patient refused    Gets together: Patient refused    Attends religious service: Patient refused    Active member of club or organization: Patient refused    Attends meetings of clubs or organizations: Patient refused    Relationship status: Patient refused  . Intimate partner violence:    Fear of current or ex partner: No    Emotionally abused: No    Physically abused: No    Forced sexual activity: No  Other Topics Concern  . Not on file  Social History Narrative  . Not on file    FAMILY HISTORY Family History  Problem Relation Age of Onset  . Cancer Mother   . Hypertension Father   . Cancer Father     ALLERGIES:  has No Known Allergies.  MEDICATIONS:  Current Outpatient Medications  Medication Sig Dispense Refill  . amLODipine (NORVASC) 5 MG tablet TAKE 1 TABLET BY MOUTH ONCE DAILY. 90 tablet 0  . aspirin EC 81 MG tablet Take 81 mg by mouth every other day.     Marland Kitchen atorvastatin (LIPITOR) 40 MG tablet TAKE (1) TABLET BY MOUTH ONCE DAILY. (Patient taking differently: Take 40 mg by mouth every evening. ) 90 tablet 1  . furosemide (LASIX) 40 MG tablet Take 1 tablet (40 mg total) by mouth daily. 90 tablet 3  . metoprolol tartrate (LOPRESSOR) 25 MG tablet Take 12.5 mg by mouth every evening.     . potassium chloride SA (K-DUR,KLOR-CON) 20 MEQ tablet Take 1 tablet (20 mEq total) by mouth daily. 90 tablet 3   No current facility-administered medications for this visit.     PHYSICAL EXAMINATION:  ECOG PERFORMANCE STATUS: 2 - Symptomatic, <50% confined to bed   Vitals:   02/18/19 1400  BP: 129/75  Pulse: 94  Resp: 18  Temp: 98.5 F (36.9 C)  SpO2: 96%    Filed Weights   02/18/19 1400  Weight: 184 lb 2 oz (83.5 kg)     Physical  Exam Constitutional:      Appearance: Normal appearance. He is obese. He is ill-appearing.  HENT:     Head: Normocephalic.     Nose: Nose normal.     Mouth/Throat:     Mouth: Mucous membranes are dry.  Eyes:     Extraocular Movements: Extraocular movements intact.     Pupils: Pupils are equal, round, and reactive to light.  Neck:     Musculoskeletal: Normal range  of motion.  Cardiovascular:     Rate and Rhythm: Normal rate and regular rhythm.     Pulses: Normal pulses.     Heart sounds: Normal heart sounds.  Pulmonary:     Effort: Pulmonary effort is normal.  Abdominal:     General: Bowel sounds are normal.  Musculoskeletal:     Right hip: He exhibits decreased range of motion and decreased strength.     Left hip: He exhibits decreased range of motion and decreased strength.  Skin:    General: Skin is warm and dry.  Neurological:     Mental Status: He is alert and oriented to person, place, and time. Mental status is at baseline.  Psychiatric:        Mood and Affect: Mood normal.        Thought Content: Thought content normal.        Judgment: Judgment normal.      LABORATORY DATA: I have personally reviewed the data as listed:  Appointment on 02/18/2019  Component Date Value Ref Range Status  . WBC 02/18/2019 19.5* 4.0 - 10.5 K/uL Final  . RBC 02/18/2019 4.35  4.22 - 5.81 MIL/uL Final  . Hemoglobin 02/18/2019 12.1* 13.0 - 17.0 g/dL Final  . HCT 02/18/2019 38.3* 39.0 - 52.0 % Final  . MCV 02/18/2019 88.0  80.0 - 100.0 fL Final  . MCH 02/18/2019 27.8  26.0 - 34.0 pg Final  . MCHC 02/18/2019 31.6  30.0 - 36.0 g/dL Final  . RDW 02/18/2019 13.8  11.5 - 15.5 % Final  . Platelets 02/18/2019 239  150 - 400 K/uL Final  . nRBC 02/18/2019 0.0  0.0 - 0.2 % Final  . Neutrophils Relative % 02/18/2019 83  % Final  . Neutro Abs 02/18/2019 16.2* 1.7 - 7.7 K/uL Final  . Lymphocytes Relative 02/18/2019 6  % Final  . Lymphs Abs 02/18/2019 1.2  0.7 - 4.0 K/uL Final  . Monocytes  Relative 02/18/2019 8  % Final  . Monocytes Absolute 02/18/2019 1.5* 0.1 - 1.0 K/uL Final  . Eosinophils Relative 02/18/2019 2  % Final  . Eosinophils Absolute 02/18/2019 0.3  0.0 - 0.5 K/uL Final  . Basophils Relative 02/18/2019 0  % Final  . Basophils Absolute 02/18/2019 0.1  0.0 - 0.1 K/uL Final  . Immature Granulocytes 02/18/2019 1  % Final  . Abs Immature Granulocytes 02/18/2019 0.15* 0.00 - 0.07 K/uL Final   Performed at [facility], [address], [place], [place] [address]  . Sodium 02/18/2019 133* 135 - 145 mmol/L Final  . Potassium 02/18/2019 4.3  3.5 - 5.1 mmol/L Final  . Chloride 02/18/2019 101  98 - 111 mmol/L Final  . CO2 02/18/2019 19* 22 - 32 mmol/L Final  . Glucose, Bld 02/18/2019 141* 70 - 99 mg/dL Final  . BUN 02/18/2019 39* 8 - 23 mg/dL Final  . Creatinine, Ser 02/18/2019 1.98* 0.61 - 1.24 mg/dL Final  . Calcium 02/18/2019 9.2  8.9 - 10.3 mg/dL Final  . Total Protein 02/18/2019 7.7  6.5 - 8.1 g/dL Final  . Albumin 02/18/2019 3.0* 3.5 - 5.0 g/dL Final  . AST 02/18/2019 34  15 - 41 U/L Final  . ALT 02/18/2019 22  0 - 44 U/L Final  . Alkaline Phosphatase 02/18/2019 194* 38 - 126 U/L Final  . Total Bilirubin 02/18/2019 0.8  0.3 - 1.2 mg/dL Final  . GFR calc non Af Amer 02/18/2019 31* >60 mL/min Final  . GFR calc Af Amer 02/18/2019 36* >  60 mL/min Final  . Anion gap 02/18/2019 13  5 - 15 Final   Performed at [facility], [address], [place], [place] [address]    RADIOGRAPHIC STUDIES: I have personally reviewed the radiological images as listed and agree with the findings in the report   ASSESSMENT/PLAN  Adenocarcinoma of left lung ([place]) 1.  Metastatic adenocarcinoma of the lung: -PDL 1 TPS-30%, foundation 1 shows MS-stable, TMB 5 Muts/Mb, K-ras G12V - Patient followed for left lung nodule, PET scan in November 2019 showed a left lung nodule (T1b N0 M0) with focal area of increased radiotracer uptake in the cecum. -Patient underwent right colectomy in December  2019, found to have stage I colon cancer. - He underwent left upper lobe lung wedge biopsy/resection on 11/26/2018.  This showed 2.8 cm poorly differentiated adenocarcinoma with visceral pleural involvement and direct invasion of the parietal pleura. - Patient has been getting progressively weak and his legs for the last 3 to 4 weeks.  He also developed back pain.  His PMD Dr. Quillian Quince ordered a MRI of the lumbar spine without contrast on 02/08/2019.  This showed metastatic disease involving the T12, L3, S1, S2 and S3 vertebral bodies as well as the left ilium.  At T12 there is probable epidural soft tissue tumor component along the right paracentral aspect extending into the right neural foramina.  At L3 there is a small epidural tumor component extending into the right paracentral/L2-3 foraminal aspect.  At S2 there is small volume epidural soft tissue component.  Pathological compression fractures of T12 and L3 bodies. -Patient had a remote history of prostate cancer, treated with radiation and seed implants more than 10 years ago.  Will obtain a PSA level today. - I have recommended staging MRI of the brain as well as a whole-body PET CT scan. -I have reached out to [place] who kindly agreed to see this patient in the next 1-2 days.  He will also review MRI films.  If there is a strong suspicion of epidural mass at T12, we will consider referring him to neurosurgery.  2.  Stage I Colon Adenocarcinoma:  - Status post right hemicolectomy on 09/18/2018, pathology showing T1 N0 M0 colon adenocarcinoma.  3.  Cancer related pain: - Dr. Olena Heckle started him on hydrocodone for his back pain which is controlling it. - We are making a referral to radiation oncology.        Orders Placed This Encounter  Procedures  . NM PET Image Restag (PS) Skull Base To Thigh    Standing Status:   Future    Standing Expiration Date:   02/18/2020    Order Specific Question:   ** REASON FOR EXAM (FREE TEXT)     Answer:   lung cancer    Order Specific Question:   If indicated for the ordered procedure, I authorize the administration of a radiopharmaceutical per Radiology protocol    Answer:   Yes    Order Specific Question:   Preferred imaging location?    Answer:   [facility]    Order Specific Question:   Radiology Contrast Protocol - do NOT remove file path    Answer:   [email or web address]  . MR Brain W Wo Contrast    Standing Status:   Future    Standing Expiration Date:   02/18/2020    Order Specific Question:   ** REASON FOR EXAM (FREE TEXT)    Answer:   lung cancer    Order Specific Question:  If indicated for the ordered procedure, I authorize the administration of contrast media per Radiology protocol    Answer:   Yes    Order Specific Question:   What is the patient's sedation requirement?    Answer:   No Sedation    Order Specific Question:   Does the patient have a pacemaker or implanted devices?    Answer:   No    Order Specific Question:   Use SRS Protocol?    Answer:   Yes    Order Specific Question:   Radiology Contrast Protocol - do NOT remove file path    Answer:   [email or web address]    Order Specific Question:   Preferred imaging location?    Answer:   [facility] (table limit-350lbs)  . PSA    Standing Status:   Future    Number of Occurrences:   1    Standing Expiration Date:   02/18/2020  . CBC with Differential/Platelet    Standing Status:   Future    Number of Occurrences:   1    Standing Expiration Date:   02/18/2020  . Comprehensive metabolic panel    Standing Status:   Future    Number of Occurrences:   1    Standing Expiration Date:   02/18/2020    All questions were answered. The patient knows to call the clinic with any problems, questions or concerns.  This note was electronically signed.    Derek Jack, MD  02/18/2019 4:54 PM

## 2019-02-19 ENCOUNTER — Encounter (HOSPITAL_COMMUNITY): Payer: Self-pay | Admitting: Lab

## 2019-02-19 DIAGNOSIS — G9520 Unspecified cord compression: Secondary | ICD-10-CM | POA: Diagnosis not present

## 2019-02-19 DIAGNOSIS — C3412 Malignant neoplasm of upper lobe, left bronchus or lung: Secondary | ICD-10-CM | POA: Diagnosis not present

## 2019-02-19 DIAGNOSIS — C7951 Secondary malignant neoplasm of bone: Secondary | ICD-10-CM | POA: Diagnosis not present

## 2019-02-19 NOTE — Progress Notes (Unsigned)
Referral to [facility].  Records faxed on 5/19

## 2019-02-21 DIAGNOSIS — Z7401 Bed confinement status: Secondary | ICD-10-CM | POA: Diagnosis not present

## 2019-02-21 DIAGNOSIS — Z8546 Personal history of malignant neoplasm of prostate: Secondary | ICD-10-CM | POA: Diagnosis not present

## 2019-02-21 DIAGNOSIS — Z85038 Personal history of other malignant neoplasm of large intestine: Secondary | ICD-10-CM | POA: Diagnosis not present

## 2019-02-21 DIAGNOSIS — C3412 Malignant neoplasm of upper lobe, left bronchus or lung: Secondary | ICD-10-CM | POA: Diagnosis not present

## 2019-02-21 DIAGNOSIS — C7951 Secondary malignant neoplasm of bone: Secondary | ICD-10-CM | POA: Diagnosis not present

## 2019-02-21 DIAGNOSIS — C3490 Malignant neoplasm of unspecified part of unspecified bronchus or lung: Secondary | ICD-10-CM | POA: Diagnosis not present

## 2019-02-21 DIAGNOSIS — R52 Pain, unspecified: Secondary | ICD-10-CM | POA: Diagnosis not present

## 2019-02-21 DIAGNOSIS — R0902 Hypoxemia: Secondary | ICD-10-CM | POA: Diagnosis not present

## 2019-02-21 DIAGNOSIS — I10 Essential (primary) hypertension: Secondary | ICD-10-CM | POA: Diagnosis not present

## 2019-02-22 ENCOUNTER — Emergency Department (HOSPITAL_COMMUNITY)
Admission: EM | Admit: 2019-02-22 | Discharge: 2019-02-22 | Discharge status: Against Medical Advice | Payer: Medicare Other | Attending: Emergency Medicine | Admitting: Emergency Medicine

## 2019-02-22 ENCOUNTER — Other Ambulatory Visit: Payer: Self-pay

## 2019-02-22 ENCOUNTER — Ambulatory Visit (HOSPITAL_COMMUNITY)
Admission: RE | Admit: 2019-02-22 | Discharge: 2019-02-22 | Disposition: A | Discharge status: Home / Self Care | Payer: Medicare Other | Source: Ambulatory Visit | Attending: Hematology | Admitting: Hematology

## 2019-02-22 ENCOUNTER — Encounter (HOSPITAL_COMMUNITY): Payer: Self-pay | Admitting: Emergency Medicine

## 2019-02-22 DIAGNOSIS — Z7401 Bed confinement status: Secondary | ICD-10-CM | POA: Diagnosis not present

## 2019-02-22 DIAGNOSIS — Z79899 Other long term (current) drug therapy: Secondary | ICD-10-CM | POA: Diagnosis not present

## 2019-02-22 DIAGNOSIS — I10 Essential (primary) hypertension: Secondary | ICD-10-CM | POA: Insufficient documentation

## 2019-02-22 DIAGNOSIS — G9389 Other specified disorders of brain: Secondary | ICD-10-CM | POA: Diagnosis not present

## 2019-02-22 DIAGNOSIS — Z20828 Contact with and (suspected) exposure to other viral communicable diseases: Secondary | ICD-10-CM | POA: Diagnosis not present

## 2019-02-22 DIAGNOSIS — R2689 Other abnormalities of gait and mobility: Secondary | ICD-10-CM | POA: Insufficient documentation

## 2019-02-22 DIAGNOSIS — R410 Disorientation, unspecified: Secondary | ICD-10-CM | POA: Diagnosis not present

## 2019-02-22 DIAGNOSIS — J449 Chronic obstructive pulmonary disease, unspecified: Secondary | ICD-10-CM | POA: Insufficient documentation

## 2019-02-22 DIAGNOSIS — Z1159 Encounter for screening for other viral diseases: Secondary | ICD-10-CM | POA: Insufficient documentation

## 2019-02-22 DIAGNOSIS — C3490 Malignant neoplasm of unspecified part of unspecified bronchus or lung: Secondary | ICD-10-CM | POA: Diagnosis not present

## 2019-02-22 DIAGNOSIS — Z85038 Personal history of other malignant neoplasm of large intestine: Secondary | ICD-10-CM | POA: Diagnosis not present

## 2019-02-22 DIAGNOSIS — Z87891 Personal history of nicotine dependence: Secondary | ICD-10-CM | POA: Diagnosis not present

## 2019-02-22 DIAGNOSIS — Z7982 Long term (current) use of aspirin: Secondary | ICD-10-CM | POA: Diagnosis not present

## 2019-02-22 DIAGNOSIS — Z8546 Personal history of malignant neoplasm of prostate: Secondary | ICD-10-CM | POA: Diagnosis not present

## 2019-02-22 DIAGNOSIS — R0902 Hypoxemia: Secondary | ICD-10-CM | POA: Diagnosis not present

## 2019-02-22 DIAGNOSIS — Z5321 Procedure and treatment not carried out due to patient leaving prior to being seen by health care provider: Secondary | ICD-10-CM | POA: Insufficient documentation

## 2019-02-22 DIAGNOSIS — R29898 Other symptoms and signs involving the musculoskeletal system: Secondary | ICD-10-CM | POA: Diagnosis not present

## 2019-02-22 DIAGNOSIS — R52 Pain, unspecified: Secondary | ICD-10-CM | POA: Diagnosis not present

## 2019-02-22 DIAGNOSIS — R531 Weakness: Secondary | ICD-10-CM | POA: Diagnosis present

## 2019-02-22 DIAGNOSIS — I639 Cerebral infarction, unspecified: Secondary | ICD-10-CM | POA: Insufficient documentation

## 2019-02-22 DIAGNOSIS — Z743 Need for continuous supervision: Secondary | ICD-10-CM | POA: Diagnosis not present

## 2019-02-22 DIAGNOSIS — R404 Transient alteration of awareness: Secondary | ICD-10-CM | POA: Diagnosis not present

## 2019-02-22 LAB — SARS CORONAVIRUS 2 BY RT PCR (HOSPITAL ORDER, PERFORMED IN ~~LOC~~ HOSPITAL LAB): SARS Coronavirus 2: NEGATIVE

## 2019-02-22 MED ORDER — GADOBUTROL 1 MMOL/ML IV SOLN
8.0000 mL | Freq: Once | INTRAVENOUS | Status: AC | PRN
  Administered 2019-02-22: 7.5 mL via INTRAVENOUS

## 2019-02-22 NOTE — Consult Note (Signed)
[facility] TeleNeurology Consult Services  Stat Consult  Date of Service:   02/22/2019 17:03:38  Comments/Sign-Out: Impression: Multiple bilat strokes - etiol embolic, cardiac v mets v other Possible cerebral mets Recommendations: Admit for stroke workup, telemetry, TTE, consider TEE, cardiology consult, PT/OT/ST consider AC if appropriate - unclear given cancer w mets d/w ED MD  CT HEAD:  MRI Br - 1. Patchy small volume multifocal acute ischemic acute to early subacute ischemic infarcts in vaulting the bilateral cerebral hemispheres and right cerebellum. Given the various vascular distributions involved, a central thromboembolic etiology is likely. 2. Single apparent 5 mm focus of ring enhancement at the left occipital cortex as above, indeterminate. While this finding could reflect a small vascular structure or possibly enhancement related to subacute ischemia, a small early metastatic lesion not entirely excluded. Close interval follow-up imaging in 2-3 months to evaluate for interval change recommended. No other evidence for intracranial metastatic disease. 3. Underlying age-related cerebral atrophy with moderate chronic small vessel ischemic disease.  Metrics: [facility] Notification Time: 02/22/2019 17:02:13 Stamp Time: 02/22/2019 17:03:38 Callback Response Time: 02/22/2019 17:10:48 Video Start Time: 02/22/2019 17:44:40  Sign Off:     .  Discussed with Emergency Department Provider  ----------------------------------------------------------------------------------------------------  Chief Complaint:   History of Present Illness: Patient is a 78 year old Male.  78 yo M h/o lung cancer w mets, HTN, HL, prostate ca, OSA, tob use, p/w 3 weeks of bilat lower extremity weakness, found to have bilateral infarcts on MRI Brain.  Examination:  Neuro Exam:  General: Alert,Awake  Speech: Fluent:  Language: Intact:  Face: Symmetric:  Facial  Sensation:  Visual Fields:  Extraocular Movements: Intact:  Motor Exam: Drift: LUE, LLE  Sensation:  Coordination: Intact: RUE, RLE    Patient/Family was informed the Neurology Consult would happen via TeleHealth consult by way of interactive audio and video telecommunications and consented to receiving care in this manner.  Due to the immediate potential for life-threatening deterioration due to underlying acute neurologic illness, I spent 35 minutes providing critical care. This time includes time for face to face visit via telemedicine, review of medical records, imaging studies and discussion of findings with providers, the patient and/or family.   Dr Launa Grill   [facility] [phone]   Case [id number]

## 2019-02-22 NOTE — ED Provider Notes (Signed)
[facility] EMERGENCY DEPARTMENT Provider Note   CSN: [id number] Arrival date & time: 02/22/19  1612    History   Chief Complaint Chief Complaint  Patient presents with   Cerebrovascular Accident    HPI Steve [name] is a 78 y.o. male.  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  He was getting an outpatient MRI brain for staging when it was noticed that he had signs of stroke and referred to the ED.  Patient states he has had weakness and pain in his right leg for about 3 to 4 weeks.  He has not noticed any blurry vision double vision difficulty speaking or swallowing.  He has some general weakness in his arms and legs but what bothers him most is the right leg weakness causing him some gait difficulty.  He says he uses a walker and a cane.  He has baseline shortness of breath.  He says his pain is a 5 out of 10 but is vague on where he is feeling pain.  He is currently living at home with his wife and is a patient of Dr. Delton Coombes here     The history is provided by the patient.  Cerebrovascular Accident  This is a new problem. The current episode started more than 1 week ago. The problem occurs constantly. The problem has not changed since onset.Associated symptoms include headaches (sometimes) and shortness of breath. Pertinent negatives include no chest pain and no abdominal pain. The symptoms are aggravated by walking. Nothing relieves the symptoms. He has tried nothing for the symptoms. The treatment provided no relief.    Past Medical History:  Diagnosis Date   Arthritis    Bladder neck contracture    Cataract    Complication of anesthesia    COPD (chronic obstructive pulmonary disease) ([place]) PER CXRAY  10-28-2010   Frequency of urination    History of prostate cancer S/P SEED IMPLANTS    History of urethral stricture    History of urinary retention    Hypertension    Nocturia    OSA (obstructive sleep  apnea) NON-COMPLIANT   does not use cpap   Peyronie disease    PONV (postoperative nausea and vomiting)    Prostate cancer [facility])    Urinary incontinence     Patient Active Problem List   Diagnosis Date Noted   Adenocarcinoma of left lung ([place]) 02/18/2019   S/P Wedge Resection LUL 11/26/2018   Nodule of upper lobe of left lung 08/07/2018   Adrenal nodule ([place]) 08/07/2018   Essential hypertension 12/01/2016   Aortic aneurysm ([place]) 06/19/2014   Traction retinal detachment 11/26/2012   Rhegmatogenous retinal detachment 10/17/2012   Proliferative vitreoretinopathy of right eye 10/17/2012    Past Surgical History:  Procedure Laterality Date   25 GAUGE PARS PLANA VITRECTOMY WITH 20 GAUGE MVR PORT Right 12/11/2012   Procedure: 25 GAUGE PARS PLANA VITRECTOMY WITH 20 GAUGE MVR PORT;  Surgeon: Hayden Pedro, MD;  Location: [place];  Service: Ophthalmology;  Laterality: Right;  Injection of silicone Oil   CATARACT EXTRACTION W/ INTRAOCULAR LENS  IMPLANT, BILATERAL     CHEST TUBE INSERTION Left 11/26/2018   Procedure: Left Lateral Chest Tube Insertion;  Surgeon: Melrose Nakayama, MD;  Location: [place];  Service: Thoracic;  Laterality: Left;   CYSTO/ URETHRAL DILATION  04-21-2008   CYSTO/ URETHRAL DILATION/  URETHRA BALLOON DILATION / [place]  10-28-2010   CYSTOSCOPY  01/27/2012   Procedure: CYSTOSCOPY;  Surgeon: Ailene Rud, MD;  Location: [facility];  Service: Urology;  Laterality: N/A;  cysto, cook balloon dilation of  bladder neck contracture gyrus button vaporization  carm    EYE SURGERY     cataract removed bilaterally   GAS INSERTION  10/23/2012   Procedure: INSERTION OF GAS;  Surgeon: Hayden Pedro, MD;  Location: [place];  Service: Ophthalmology;  Laterality: Right;  [id number]   GAS/FLUID EXCHANGE Right 12/11/2012   Procedure: GAS/FLUID EXCHANGE;  Surgeon: Hayden Pedro, MD;  Location: [place];   Service: Ophthalmology;  Laterality: Right;   IRIDECTOMY Right 12/11/2012   Procedure: IRIDECTOMY;  Surgeon: Hayden Pedro, MD;  Location: [place];  Service: Ophthalmology;  Laterality: Right;   LASER PHOTO ABLATION Right 12/11/2012   Procedure: LASER PHOTO ABLATION;  Surgeon: Hayden Pedro, MD;  Location: [place];  Service: Ophthalmology;  Laterality: Right;   MEMBRANE PEEL Right 12/11/2012   Procedure: MEMBRANE PEEL;  Surgeon: Hayden Pedro, MD;  Location: [place];  Service: Ophthalmology;  Laterality: Right;   PERFLUORONE INJECTION Right 12/11/2012   Procedure: PERFLUORONE INJECTION;  Surgeon: Hayden Pedro, MD;  Location: [place];  Service: Ophthalmology;  Laterality: Right;  Injection and Removal   PHOTOCOAGULATION WITH LASER  10/23/2012   Procedure: PHOTOCOAGULATION WITH LASER;  Surgeon: Hayden Pedro, MD;  Location: [place];  Service: Ophthalmology;  Laterality: Right;   PROSTAGLANDIN INJECTION  2003   RADIOACTIVE SEED IMPLANTS, PROSTATE  06-20-2002   SCLERAL BUCKLE  10/23/2012   Procedure: SCLERAL BUCKLE;  Surgeon: Hayden Pedro, MD;  Location: [place];  Service: Ophthalmology;  Laterality: Right;   TRANSURETHRAL RESECTION OF BLADDER TUMOR  05/15/2012   Procedure: TRANSURETHRAL RESECTION OF BLADDER TUMOR (TURBT);  Surgeon: Ailene Rud, MD;  Location: [facility];  Service: Urology;  Laterality: N/A;   VIDEO ASSISTED THORACOSCOPY (VATS)/WEDGE RESECTION Left 11/26/2018   Procedure: LEFT VIDEO ASSISTED THORACOSCOPY (VATS)/WEDGE RESECTION;  Surgeon: Melrose Nakayama, MD;  Location: [place];  Service: Thoracic;  Laterality: Left;        Home Medications    Prior to Admission medications   Medication Sig Start Date End Date Taking? Authorizing Provider  amLODipine (NORVASC) 5 MG tablet TAKE 1 TABLET BY MOUTH ONCE DAILY. 01/23/19   Herminio Commons, MD  aspirin EC 81 MG tablet Take 81 mg by mouth every other day.     [provider]  atorvastatin (LIPITOR) 40 MG  tablet TAKE (1) TABLET BY MOUTH ONCE DAILY. Patient taking differently: Take 40 mg by mouth every evening.  07/26/18   Arnoldo Lenis, MD  furosemide (LASIX) 40 MG tablet Take 1 tablet (40 mg total) by mouth daily. 11/20/18 11/15/19  Strader, Fransisco Hertz, PA-C  metoprolol tartrate (LOPRESSOR) 25 MG tablet Take 12.5 mg by mouth every evening.     [provider]  potassium chloride SA (K-DUR,KLOR-CON) 20 MEQ tablet Take 1 tablet (20 mEq total) by mouth daily. 11/20/18 11/20/19  Erma Heritage, PA-C    Family History Family History  Problem Relation Age of Onset   Cancer Mother    Hypertension Father    Cancer Father     Social History Social History   Tobacco Use   Smoking status: Former Smoker    Packs/day: 2.00    Years: 20.00    Pack years: 40.00    Types: Cigarettes    Start  date: 10/24/1955    Last attempt to quit: 01/24/1981    Years since quitting: 38.1   Smokeless tobacco: Never Used  Substance Use Topics   Alcohol use: No    Alcohol/week: 0.0 standard drinks   Drug use: No     Allergies   Patient has no known allergies.   Review of Systems Review of Systems  Constitutional: Negative for fever.  HENT: Negative for sore throat.   Eyes: Negative for visual disturbance.  Respiratory: Positive for shortness of breath.   Cardiovascular: Negative for chest pain.  Gastrointestinal: Negative for abdominal pain.  Genitourinary: Negative for hematuria.  Musculoskeletal: Positive for gait problem.  Skin: Negative for rash.  Neurological: Positive for weakness and headaches (sometimes). Negative for speech difficulty.     Physical Exam Updated Vital Signs BP 126/68 (BP Location: Right Arm)    Pulse 65    Temp 98.1 F (36.7 C) (Oral)    Resp 18    Ht '5\' 11"'$  (1.803 m)    Wt 83.5 kg    SpO2 96%    BMI 25.67 kg/m   Physical Exam Vitals signs and nursing note reviewed.  Constitutional:      Appearance: He is well-developed.  HENT:     Head:  Normocephalic and atraumatic.  Eyes:     Conjunctiva/sclera: Conjunctivae normal.  Neck:     Musculoskeletal: Neck supple.  Cardiovascular:     Rate and Rhythm: Normal rate and regular rhythm.     Heart sounds: No murmur.  Pulmonary:     Effort: Pulmonary effort is normal. No respiratory distress.     Breath sounds: Normal breath sounds.  Abdominal:     Palpations: Abdomen is soft.     Tenderness: There is no abdominal tenderness. There is no guarding.  Musculoskeletal: Normal range of motion.  Skin:    General: Skin is warm and dry.     Capillary Refill: Capillary refill takes less than 2 seconds.  Neurological:     Mental Status: He is alert.     Comments: Neurologic exam limited by patient's participation.  He has no obvious facial droop, extraocular movements intact, upper extremity strength preserved.  Lower extremities unable to lift his right leg off the bed.  He is able to lift the left lower leg off the bed for a few seconds.  Plantar flexion intact.  Poor proprioception in his lower extremities.      ED Treatments / Results  Labs (all labs ordered are listed, but only abnormal results are displayed) Labs Reviewed  SARS CORONAVIRUS 2 (HOSPITAL ORDER, [place] [facility])  ETHANOL  PROTIME-INR  APTT  CBC  DIFFERENTIAL  COMPREHENSIVE METABOLIC PANEL  RAPID URINE DRUG SCREEN, HOSP PERFORMED  URINALYSIS, ROUTINE W REFLEX MICROSCOPIC    EKG None  Radiology Mr Jeri Cos Wo Contrast  Result Date: 02/22/2019 CLINICAL DATA:  Initial evaluation for lung cancer staging. EXAM: MRI HEAD WITHOUT AND WITH CONTRAST TECHNIQUE: Multiplanar, multiecho pulse sequences of the brain and surrounding structures were obtained without and with intravenous contrast. CONTRAST:  7.5 cc of Gadavist. COMPARISON:  None available. FINDINGS: Brain: Generalized age-related cerebral atrophy. Patchy and confluent T2/FLAIR hyperintensity within the periventricular deep white matter  both cerebral hemispheres, most consistent with chronic microvascular ischemic disease, moderate in nature. Superimposed remote lacunar infarct present at the posterior right lentiform nucleus. Few small remote cortical infarcts seen involving the right frontal lobe (series 9, image 42) as well as the right parietal  lobe (series 9, image 31). Multifocal patchy foci of restricted diffusion seen involving the bilateral cerebral and right cerebellar hemisphere, compatible with multifocal acute to early subacute ischemic infarcts. For reference purposes, the largest focus of ischemia seen at the left parietal cortex and measures 11 mm (series 3, image 101). Largest focus on the right seen at the right occipital cortex and measures 6 mm (series 3, image 76). Largest infratentorial focus measures 5 mm (series 3, image 67). No associated hemorrhage or mass effect. There is a single subtle 5 mm focus of rim enhancement seen involving the left occipital cortex, best seen on coronal postcontrast sequence (series 13, image 4), but also seen on sagittal sequence (series 14, image 18). Finding of uncertain significance, and could reflect a small vascular structure or possibly sequelae of subacute ischemia. A small early metastasis not entirely excluded. No other mass lesion or abnormal enhancement. No midline shift or mass effect. No hydrocephalus. No extra-axial fluid collection. Pituitary gland suprasellar region normal. Vascular: Major intracranial vascular flow voids are maintained. Skull and upper cervical spine: Craniocervical junction normal. Bone marrow signal intensity within normal limits. No focal marrow replacing lesions. Scalp soft tissue unremarkable. Sinuses/Orbits: Postsurgical changes noted at the right globe. Patient status post bilateral ocular lens replacement. No acute abnormality about the globes and orbital soft tissues. Right maxillary sinus retention cyst noted. Mild mucosal thickening within the right  sphenoid sinus. Paranasal sinuses are otherwise clear. No mastoid effusion. Inner ear structures grossly normal. Other: None. IMPRESSION: 1. Patchy small volume multifocal acute ischemic acute to early subacute ischemic infarcts in vaulting the bilateral cerebral hemispheres and right cerebellum. Given the various vascular distributions involved, a central thromboembolic etiology is likely. 2. Single apparent 5 mm focus of ring enhancement at the left occipital cortex as above, indeterminate. While this finding could reflect a small vascular structure or possibly enhancement related to subacute ischemia, a small early metastatic lesion not entirely excluded. Close interval follow-up imaging in 2-3 months to evaluate for interval change recommended. No other evidence for intracranial metastatic disease. 3. Underlying age-related cerebral atrophy with moderate chronic small vessel ischemic disease. Results were communicated by telephone at the time of interpretation on 02/22/2019 at 4:29 pm to Dr. Lacinda Axon of the [facility] emergency department. Electronically Signed   By: Jeannine Boga M.D.   On: 02/22/2019 16:29    Procedures Procedures (including critical care time)  Medications Ordered in ED Medications - No data to display   Initial Impression / Assessment and Plan / ED Course  I have reviewed the triage vital signs and the nursing notes.  Pertinent labs & imaging results that were available during my care of the patient were reviewed by me and considered in my medical decision making (see chart for details).  Clinical Course as of Feb 22 2003  Fri Feb 22, 2019  1636 EKG is normal sinus rhythm rate of 65 right bundle branch block pattern similar to prior 2/20.   [MB]  2706 Patient's outpatient MRI report is as follows: IMPRESSION: 1. Patchy small volume multifocal acute ischemic acute to early subacute ischemic infarcts in vaulting the bilateral cerebral hemispheres and right cerebellum.  Given the various vascular distributions involved, a central thromboembolic etiology is likely. 2. Single apparent 5 mm focus of ring enhancement at the left occipital cortex as above, indeterminate. While this finding could reflect a small vascular structure or possibly enhancement related to subacute ischemia, a small early metastatic lesion not entirely excluded. Close interval  follow-up imaging in 2-3 months to evaluate for interval change recommended. No other evidence for intracranial metastatic disease. 3. Underlying age-related cerebral atrophy with moderate chronic small vessel ischemic disease.   [MB]  1646 Differential diagnosis includes stroke, embolism, metastatic disease.  I put a call into tele-neurology for an evaluation.  The reading of the MRI shows multifocal acute ischemic acute to subacute ischemic infarcts in bilateral hemispheres and right cerebellum.  The radiologist postulates that this might be central embolic in etiology due to the various distributions.  They also comment upon a ring-enhancing cortical lesion that could be ischemia or a met   [MB]  1707 From Oncology note 5/18 - - Patient has been getting progressively weak and his legs for the last 3 to 4 weeks.  He also developed back pain.  His PMD Dr. Quillian Quince ordered a MRI of the lumbar spine without contrast on 02/08/2019.  This showed metastatic disease involving the T12, L3, S1, S2 and S3 vertebral bodies as well as the left ilium.  At T12 there is probable epidural soft tissue tumor component along the right paracentral aspect extending into the right neural foramina.  At L3 there is a small epidural tumor component extending into the right paracentral/L2-3 foraminal aspect.  At S2 there is small volume epidural soft tissue component.  Pathological compression fractures of T12 and L3 bodies.   [MB]  4196 Received a call from tele-neurology who said he would call in and do a tele-neuro evaluation on the patient.    [MB]  2229 Received call back from the tele-neurologist.  She said he is had a stroke and she is appreciating some right greater than left lower extremity weakness.  Her recommendation is admission to the hospital for further work-up including possible cardiac cause of his strokes.  I reviewed this with the patient.  He said he would rather go home and see how he feels.  He understands that this is a significant serious event and that it could lead to permanent disability or even death.  He is talking with his wife on the phone now and they are trying to come to some sort of agreement on whether he should stay or not.   [MB]  7989 Ultimately the patient is refusing admission.   [MB]    Clinical Course User Index [MB] Hayden Rasmussen, MD   Janine Ores was evaluated in Emergency Department on 02/22/2019 for the symptoms described in the history of present illness. He was evaluated in the context of the global COVID-19 pandemic, which necessitated consideration that the patient might be at risk for infection with the SARS-CoV-2 virus that causes COVID-19. Institutional protocols and algorithms that pertain to the evaluation of patients at risk for COVID-19 are in a state of rapid change based on information released by regulatory bodies including the CDC and federal and state organizations. These policies and algorithms were followed during the patient's care in the ED.     Final Clinical Impressions(s) / ED Diagnoses   Final diagnoses:  Cerebrovascular accident (CVA), unspecified mechanism ([place])  Primary malignant neoplasm of lung metastatic to other site, unspecified laterality [facility])    ED Discharge Orders    None       Hayden Rasmussen, MD 02/22/19 2005

## 2019-02-22 NOTE — ED Notes (Signed)
[facility] to transport patient back home

## 2019-02-22 NOTE — ED Triage Notes (Signed)
Patient sent to ED by MRI. During MRI to stage lung cancer, evidence of stroke was found. Patient denies symptoms no last known well.

## 2019-02-22 NOTE — ED Notes (Signed)
EPD at bedside. Pt wanting to sign out AMA and go home.

## 2019-02-22 NOTE — ED Notes (Signed)
Pt wanting to go home AMA. Risks discussed with pt and family. Pt verbalized understanding and still requesting to go home.

## 2019-02-26 ENCOUNTER — Other Ambulatory Visit: Payer: Self-pay | Admitting: Cardiology

## 2019-02-26 ENCOUNTER — Other Ambulatory Visit (HOSPITAL_COMMUNITY): Payer: Self-pay | Admitting: *Deleted

## 2019-02-26 DIAGNOSIS — R404 Transient alteration of awareness: Secondary | ICD-10-CM | POA: Diagnosis not present

## 2019-02-26 DIAGNOSIS — M5489 Other dorsalgia: Secondary | ICD-10-CM | POA: Diagnosis not present

## 2019-02-26 DIAGNOSIS — Z85038 Personal history of other malignant neoplasm of large intestine: Secondary | ICD-10-CM | POA: Diagnosis not present

## 2019-02-26 DIAGNOSIS — C3490 Malignant neoplasm of unspecified part of unspecified bronchus or lung: Secondary | ICD-10-CM

## 2019-02-26 DIAGNOSIS — Z743 Need for continuous supervision: Secondary | ICD-10-CM | POA: Diagnosis not present

## 2019-02-26 DIAGNOSIS — Z9181 History of falling: Secondary | ICD-10-CM | POA: Diagnosis not present

## 2019-02-26 DIAGNOSIS — R5381 Other malaise: Secondary | ICD-10-CM | POA: Diagnosis not present

## 2019-02-26 DIAGNOSIS — Z7401 Bed confinement status: Secondary | ICD-10-CM | POA: Diagnosis not present

## 2019-02-26 DIAGNOSIS — R52 Pain, unspecified: Secondary | ICD-10-CM | POA: Diagnosis not present

## 2019-02-26 DIAGNOSIS — Z8546 Personal history of malignant neoplasm of prostate: Secondary | ICD-10-CM | POA: Diagnosis not present

## 2019-02-26 NOTE — Progress Notes (Signed)
Patient is unable to get [place].  I talked with Dr. Delton Coombes and he gave orders for CT CAP.  Scheduler aware to cancel PET scan and schedule CT CAP.

## 2019-02-28 ENCOUNTER — Ambulatory Visit (HOSPITAL_COMMUNITY): Payer: Medicare Other

## 2019-02-28 DIAGNOSIS — Z85038 Personal history of other malignant neoplasm of large intestine: Secondary | ICD-10-CM | POA: Diagnosis not present

## 2019-02-28 DIAGNOSIS — M8458XD Pathological fracture in neoplastic disease, other specified site, subsequent encounter for fracture with routine healing: Secondary | ICD-10-CM | POA: Diagnosis not present

## 2019-02-28 DIAGNOSIS — Z9181 History of falling: Secondary | ICD-10-CM | POA: Diagnosis not present

## 2019-02-28 DIAGNOSIS — C3490 Malignant neoplasm of unspecified part of unspecified bronchus or lung: Secondary | ICD-10-CM | POA: Diagnosis not present

## 2019-02-28 DIAGNOSIS — R52 Pain, unspecified: Secondary | ICD-10-CM | POA: Diagnosis not present

## 2019-02-28 DIAGNOSIS — C3492 Malignant neoplasm of unspecified part of left bronchus or lung: Secondary | ICD-10-CM | POA: Diagnosis not present

## 2019-02-28 DIAGNOSIS — H269 Unspecified cataract: Secondary | ICD-10-CM | POA: Diagnosis not present

## 2019-02-28 DIAGNOSIS — J449 Chronic obstructive pulmonary disease, unspecified: Secondary | ICD-10-CM | POA: Diagnosis not present

## 2019-02-28 DIAGNOSIS — M1990 Unspecified osteoarthritis, unspecified site: Secondary | ICD-10-CM | POA: Diagnosis not present

## 2019-02-28 DIAGNOSIS — M6281 Muscle weakness (generalized): Secondary | ICD-10-CM | POA: Diagnosis not present

## 2019-02-28 DIAGNOSIS — N320 Bladder-neck obstruction: Secondary | ICD-10-CM | POA: Diagnosis not present

## 2019-02-28 DIAGNOSIS — C7951 Secondary malignant neoplasm of bone: Secondary | ICD-10-CM | POA: Diagnosis not present

## 2019-02-28 DIAGNOSIS — R29898 Other symptoms and signs involving the musculoskeletal system: Secondary | ICD-10-CM | POA: Diagnosis not present

## 2019-02-28 DIAGNOSIS — R531 Weakness: Secondary | ICD-10-CM | POA: Diagnosis not present

## 2019-02-28 DIAGNOSIS — I719 Aortic aneurysm of unspecified site, without rupture: Secondary | ICD-10-CM | POA: Diagnosis not present

## 2019-02-28 DIAGNOSIS — G4733 Obstructive sleep apnea (adult) (pediatric): Secondary | ICD-10-CM | POA: Diagnosis not present

## 2019-02-28 DIAGNOSIS — I69398 Other sequelae of cerebral infarction: Secondary | ICD-10-CM | POA: Diagnosis not present

## 2019-02-28 DIAGNOSIS — Z8546 Personal history of malignant neoplasm of prostate: Secondary | ICD-10-CM | POA: Diagnosis not present

## 2019-02-28 DIAGNOSIS — Z7401 Bed confinement status: Secondary | ICD-10-CM | POA: Diagnosis not present

## 2019-02-28 DIAGNOSIS — I10 Essential (primary) hypertension: Secondary | ICD-10-CM | POA: Diagnosis not present

## 2019-02-28 DIAGNOSIS — Z902 Acquired absence of lung [part of]: Secondary | ICD-10-CM | POA: Diagnosis not present

## 2019-02-28 DIAGNOSIS — Z87891 Personal history of nicotine dependence: Secondary | ICD-10-CM | POA: Diagnosis not present

## 2019-03-01 ENCOUNTER — Other Ambulatory Visit: Payer: Self-pay

## 2019-03-01 ENCOUNTER — Emergency Department (HOSPITAL_COMMUNITY): Payer: Medicare Other

## 2019-03-01 ENCOUNTER — Emergency Department (HOSPITAL_COMMUNITY)
Admission: EM | Admit: 2019-03-01 | Discharge: 2019-03-01 | Disposition: A | Discharge status: Home / Self Care | Payer: Medicare Other | Attending: Emergency Medicine | Admitting: Emergency Medicine

## 2019-03-01 ENCOUNTER — Ambulatory Visit (HOSPITAL_COMMUNITY): Payer: Medicare Other | Admitting: Hematology

## 2019-03-01 ENCOUNTER — Encounter (HOSPITAL_COMMUNITY): Payer: Self-pay | Admitting: Emergency Medicine

## 2019-03-01 DIAGNOSIS — Z1159 Encounter for screening for other viral diseases: Secondary | ICD-10-CM | POA: Diagnosis not present

## 2019-03-01 DIAGNOSIS — R52 Pain, unspecified: Secondary | ICD-10-CM | POA: Diagnosis not present

## 2019-03-01 DIAGNOSIS — Z515 Encounter for palliative care: Secondary | ICD-10-CM

## 2019-03-01 DIAGNOSIS — R392 Extrarenal uremia: Secondary | ICD-10-CM | POA: Diagnosis not present

## 2019-03-01 DIAGNOSIS — R531 Weakness: Secondary | ICD-10-CM | POA: Diagnosis present

## 2019-03-01 DIAGNOSIS — N19 Unspecified kidney failure: Secondary | ICD-10-CM

## 2019-03-01 DIAGNOSIS — G4733 Obstructive sleep apnea (adult) (pediatric): Secondary | ICD-10-CM | POA: Diagnosis not present

## 2019-03-01 DIAGNOSIS — R4182 Altered mental status, unspecified: Secondary | ICD-10-CM | POA: Diagnosis not present

## 2019-03-01 DIAGNOSIS — E875 Hyperkalemia: Secondary | ICD-10-CM

## 2019-03-01 DIAGNOSIS — Z743 Need for continuous supervision: Secondary | ICD-10-CM | POA: Diagnosis not present

## 2019-03-01 DIAGNOSIS — J449 Chronic obstructive pulmonary disease, unspecified: Secondary | ICD-10-CM | POA: Insufficient documentation

## 2019-03-01 DIAGNOSIS — Z87891 Personal history of nicotine dependence: Secondary | ICD-10-CM | POA: Diagnosis not present

## 2019-03-01 DIAGNOSIS — C7951 Secondary malignant neoplasm of bone: Secondary | ICD-10-CM | POA: Diagnosis not present

## 2019-03-01 DIAGNOSIS — C3490 Malignant neoplasm of unspecified part of unspecified bronchus or lung: Secondary | ICD-10-CM | POA: Diagnosis not present

## 2019-03-01 DIAGNOSIS — Z7982 Long term (current) use of aspirin: Secondary | ICD-10-CM | POA: Insufficient documentation

## 2019-03-01 DIAGNOSIS — R0902 Hypoxemia: Secondary | ICD-10-CM | POA: Diagnosis not present

## 2019-03-01 DIAGNOSIS — Z85038 Personal history of other malignant neoplasm of large intestine: Secondary | ICD-10-CM | POA: Diagnosis not present

## 2019-03-01 DIAGNOSIS — Z79899 Other long term (current) drug therapy: Secondary | ICD-10-CM | POA: Insufficient documentation

## 2019-03-01 DIAGNOSIS — Z8546 Personal history of malignant neoplasm of prostate: Secondary | ICD-10-CM | POA: Diagnosis not present

## 2019-03-01 DIAGNOSIS — I10 Essential (primary) hypertension: Secondary | ICD-10-CM | POA: Insufficient documentation

## 2019-03-01 DIAGNOSIS — Z7401 Bed confinement status: Secondary | ICD-10-CM | POA: Diagnosis not present

## 2019-03-01 DIAGNOSIS — C3412 Malignant neoplasm of upper lobe, left bronchus or lung: Secondary | ICD-10-CM | POA: Diagnosis not present

## 2019-03-01 DIAGNOSIS — N179 Acute kidney failure, unspecified: Secondary | ICD-10-CM | POA: Diagnosis not present

## 2019-03-01 LAB — COMPREHENSIVE METABOLIC PANEL
ALT: 38 U/L (ref 0–44)
AST: 48 U/L — ABNORMAL HIGH (ref 15–41)
Albumin: 3 g/dL — ABNORMAL LOW (ref 3.5–5.0)
Alkaline Phosphatase: 241 U/L — ABNORMAL HIGH (ref 38–126)
Anion gap: 13 (ref 5–15)
BUN: 91 mg/dL — ABNORMAL HIGH (ref 8–23)
CO2: 19 mmol/L — ABNORMAL LOW (ref 22–32)
Calcium: 9.9 mg/dL (ref 8.9–10.3)
Chloride: 104 mmol/L (ref 98–111)
Creatinine, Ser: 1.89 mg/dL — ABNORMAL HIGH (ref 0.61–1.24)
GFR calc Af Amer: 38 mL/min — ABNORMAL LOW (ref >60)
GFR calc non Af Amer: 33 mL/min — ABNORMAL LOW (ref >60)
Glucose, Bld: 133 mg/dL — ABNORMAL HIGH (ref 70–99)
Potassium: 6.5 mmol/L (ref 3.5–5.1)
Sodium: 136 mmol/L (ref 135–145)
Total Bilirubin: 0.9 mg/dL (ref 0.3–1.2)
Total Protein: 7.7 g/dL (ref 6.5–8.1)

## 2019-03-01 LAB — LACTIC ACID, PLASMA
Lactic Acid, Venous: 2.8 mmol/L (ref 0.5–1.9)
Lactic Acid, Venous: 3.5 mmol/L (ref 0.5–1.9)

## 2019-03-01 LAB — I-STAT CHEM 8, ED
BUN: 90 mg/dL — ABNORMAL HIGH (ref 8–23)
Calcium, Ion: 1.24 mmol/L (ref 1.15–1.40)
Chloride: 111 mmol/L (ref 98–111)
Creatinine, Ser: 2 mg/dL — ABNORMAL HIGH (ref 0.61–1.24)
Glucose, Bld: 132 mg/dL — ABNORMAL HIGH (ref 70–99)
HCT: 40 % (ref 39.0–52.0)
Hemoglobin: 13.6 g/dL (ref 13.0–17.0)
Potassium: 5.9 mmol/L — ABNORMAL HIGH (ref 3.5–5.1)
Sodium: 133 mmol/L — ABNORMAL LOW (ref 135–145)
TCO2: 19 mmol/L — ABNORMAL LOW (ref 22–32)

## 2019-03-01 LAB — CBC WITH DIFFERENTIAL/PLATELET
Abs Immature Granulocytes: 0.33 10*3/uL — ABNORMAL HIGH (ref 0.00–0.07)
Basophils Absolute: 0.1 10*3/uL (ref 0.0–0.1)
Basophils Relative: 0 %
Eosinophils Absolute: 0 10*3/uL (ref 0.0–0.5)
Eosinophils Relative: 0 %
HCT: 41.5 % (ref 39.0–52.0)
Hemoglobin: 13.1 g/dL (ref 13.0–17.0)
Immature Granulocytes: 2 %
Lymphocytes Relative: 1 %
Lymphs Abs: 0.1 10*3/uL — ABNORMAL LOW (ref 0.7–4.0)
MCH: 27.7 pg (ref 26.0–34.0)
MCHC: 31.6 g/dL (ref 30.0–36.0)
MCV: 87.7 fL (ref 80.0–100.0)
Monocytes Absolute: 1 10*3/uL (ref 0.1–1.0)
Monocytes Relative: 5 %
Neutro Abs: 18.4 10*3/uL — ABNORMAL HIGH (ref 1.7–7.7)
Neutrophils Relative %: 92 %
Platelets: 156 10*3/uL (ref 150–400)
RBC: 4.73 MIL/uL (ref 4.22–5.81)
RDW: 14.1 % (ref 11.5–15.5)
WBC: 20 10*3/uL — ABNORMAL HIGH (ref 4.0–10.5)
nRBC: 0 % (ref 0.0–0.2)

## 2019-03-01 LAB — SARS CORONAVIRUS 2 BY RT PCR (HOSPITAL ORDER, PERFORMED IN ~~LOC~~ HOSPITAL LAB): SARS Coronavirus 2: NEGATIVE

## 2019-03-01 LAB — CBG MONITORING, ED: Glucose-Capillary: 112 mg/dL — ABNORMAL HIGH (ref 70–99)

## 2019-03-01 LAB — TROPONIN I: Troponin I: 0.03 ng/mL (ref <0.03)

## 2019-03-01 LAB — PROTIME-INR
INR: 1.1 (ref 0.8–1.2)
Prothrombin Time: 14 seconds (ref 11.4–15.2)

## 2019-03-01 LAB — APTT: aPTT: 28 seconds (ref 24–36)

## 2019-03-01 MED ORDER — SODIUM CHLORIDE 0.9 % IV SOLN: 1.0000 g | Freq: Once | INTRAVENOUS | Status: DC

## 2019-03-01 MED ORDER — OXYCODONE HCL 5 MG PO TABS: 5.0000 mg | ORAL_TABLET | ORAL | Status: DC | PRN

## 2019-03-01 MED ORDER — SODIUM CHLORIDE 0.9 % IV BOLUS: 1000.0000 mL | Freq: Once | INTRAVENOUS | Status: DC

## 2019-03-01 MED ORDER — CALCIUM GLUCONATE-NACL 1-0.675 GM/50ML-% IV SOLN
1.0000 g | Freq: Once | INTRAVENOUS | Status: AC
  Administered 2019-03-01: 1000 mg via INTRAVENOUS
  Filled 2019-03-01: qty 50

## 2019-03-01 MED ORDER — INSULIN ASPART 100 UNIT/ML ~~LOC~~ SOLN
SUBCUTANEOUS | Status: AC
  Administered 2019-03-01: 5 [IU] via INTRAVENOUS
  Filled 2019-03-01: qty 1

## 2019-03-01 MED ORDER — SODIUM BICARBONATE 8.4 % IV SOLN
50.0000 meq | Freq: Once | INTRAVENOUS | Status: AC
  Administered 2019-03-01: 50 meq via INTRAVENOUS
  Filled 2019-03-01: qty 50

## 2019-03-01 MED ORDER — SODIUM CHLORIDE 0.9 % IV BOLUS
1000.0000 mL | Freq: Once | INTRAVENOUS | Status: AC
  Administered 2019-03-01: 1000 mL via INTRAVENOUS

## 2019-03-01 MED ORDER — DEXTROSE 50 % IV SOLN
INTRAVENOUS | Status: AC
  Filled 2019-03-01: qty 50

## 2019-03-01 MED ORDER — INSULIN ASPART 100 UNIT/ML IV SOLN
5.0000 [IU] | Freq: Once | INTRAVENOUS | Status: AC
  Administered 2019-03-01: 17:00:00 5 [IU] via INTRAVENOUS

## 2019-03-01 MED ORDER — DEXTROSE 50 % IV SOLN
1.0000 | Freq: Once | INTRAVENOUS | Status: AC
  Administered 2019-03-01: 50 mL via INTRAVENOUS

## 2019-03-01 NOTE — ED Notes (Signed)
Rad at bedside.

## 2019-03-01 NOTE — ED Notes (Signed)
Pt is sedate, quiet, arouses when spoken to   Reports she continues to feel her ability to swallow is impaired   She has been given the epi pen, and is resting

## 2019-03-01 NOTE — ED Provider Notes (Addendum)
[facility] EMERGENCY DEPARTMENT Provider Note   CSN: [id number] Arrival date & time: 03/01/19  1349    History   Chief Complaint Chief Complaint  Patient presents with  . Weakness    HPI Steve [name] is a 78 y.o. male.     HPI   Pt Is an 78 year old male with a history of COPD, prostate cancer, hypertension, OSA, who presents to the emergency department today for evaluation of generalized weakness and lethargy noted by the cancer center prior to arrival for treatment and was sent here for further evaluation after being noted to have decreased level of consciousness.  Level 5 caveat given patient's altered mental status.  On review of prior records, patient was seen in the ED 02/22/2019 for evaluation of concern for CVA.  He had CT scan and MRI.  Imaging showed multiple bilateral strokes with concern of embolic source versus possible mets.  He also had a 5 mm focus of ring enhancement to the left occipital cortex of unclear etiology, possibly related to subacute ischemia, versus early metastatic lesion. He was advised to be admitted for stroke work-up, with consideration for TEE and cards consult.  Patient ultimately decided to leave AMA at that visit.  Past Medical History:  Diagnosis Date  . Arthritis   . Bladder neck contracture   . Cataract   . Complication of anesthesia   . COPD (chronic obstructive pulmonary disease) ([place]) PER CXRAY  10-28-2010  . Frequency of urination   . History of prostate cancer S/P SEED IMPLANTS   . History of urethral stricture   . History of urinary retention   . Hypertension   . Nocturia   . OSA (obstructive sleep apnea) NON-COMPLIANT   does not use cpap  . P[place]ronie disease   . PONV (postoperative nausea and vomiting)   . Prostate cancer ([place])   . Urinary incontinence     Patient Active Problem List   Diagnosis Date Noted  . Adenocarcinoma of left lung ([place]) 02/18/2019  . S/P Wedge Resection LUL 11/26/2018  . Nodule of upper lobe  of left lung 08/07/2018  . Adrenal nodule ([place]) 08/07/2018  . Essential hypertension 12/01/2016  . Aortic aneurysm ([place]) 06/19/2014  . Traction retinal detachment 11/26/2012  . Rhegmatogenous retinal detachment 10/17/2012  . 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 [place] VITRECTOMY WITH 20 GAUGE MVR PORT Right 12/11/2012   Procedure: 25 GAUGE PARS PLANA VITRECTOMY WITH 20 GAUGE MVR PORT;  Surgeon: Hayden Pedro, MD;  Location: [place];  Service: Ophthalmology;  Laterality: Right;  Injection of silicone Oil  . CATARACT EXTRACTION W/ INTRAOCULAR LENS  IMPLANT, BILATERAL    . CHEST TUBE INSERTION Left 11/26/2018   Procedure: Left Lateral Chest Tube Insertion;  Surgeon: Melrose Nakayama, MD;  Location: [place];  Service: Thoracic;  Laterality: Left;  . CYSTO/ URETHRAL DILATION  04-21-2008  . CYSTO/ URETHRAL DILATION/  URETHRA BALLOON DILATION / VAPORIZATION OF PROSTATE AND URETHRAL STRICTURE  10-28-2010  . CYSTOSCOPY  01/27/2012   Procedure: CYSTOSCOPY;  Surgeon: Ailene Rud, MD;  Location: [facility];  Service: Urology;  Laterality: N/A;  cysto, cook balloon dilation of  bladder neck contracture gyrus button vaporization  carm   . EYE SURGERY     cataract removed bilaterally  . GAS INSERTION  10/23/2012   Procedure: INSERTION OF GAS;  Surgeon: Hayden Pedro, MD;  Location: [facility];  Service: Ophthalmology;  Laterality: Right;  [id number]  . GAS/FLUID EXCHANGE Right 12/11/2012   Procedure: GAS/FLUID EXCHANGE;  Surgeon: Hayden Pedro, MD;  Location: [place];  Service: Ophthalmology;  Laterality: Right;  . IRIDECTOMY Right 12/11/2012   Procedure: IRIDECTOMY;  Surgeon: Hayden Pedro, MD;  Location: [place];  Service: Ophthalmology;  Laterality: Right;  . LASER PHOTO ABLATION Right 12/11/2012   Procedure: LASER PHOTO ABLATION;  Surgeon: Hayden Pedro, MD;  Location: [place];  Service:  Ophthalmology;  Laterality: Right;  . MEMBRANE PEEL Right 12/11/2012   Procedure: MEMBRANE PEEL;  Surgeon: Hayden Pedro, MD;  Location: [place];  Service: Ophthalmology;  Laterality: Right;  . [place] INJECTION Right 12/11/2012   Procedure: PERFLUORONE INJECTION;  Surgeon: Hayden Pedro, MD;  Location: [place];  Service: Ophthalmology;  Laterality: Right;  Injection and Removal  . PHOTOCOAGULATION WITH LASER  10/23/2012   Procedure: PHOTOCOAGULATION WITH LASER;  Surgeon: Hayden Pedro, MD;  Location: [place];  Service: Ophthalmology;  Laterality: Right;  . [place] INJECTION  2003  . RADIOACTIVE SEED IMPLANTS, PROSTATE  06-20-2002  . SCLERAL BUCKLE  10/23/2012   Procedure: SCLERAL BUCKLE;  Surgeon: Hayden Pedro, MD;  Location: [place];  Service: Ophthalmology;  Laterality: Right;  . TRANSURETHRAL RESECTION OF BLADDER TUMOR  05/15/2012   Procedure: TRANSURETHRAL RESECTION OF BLADDER TUMOR (TURBT);  Surgeon: Ailene Rud, MD;  Location: [facility];  Service: Urology;  Laterality: N/A;  . VIDEO ASSISTED THORACOSCOPY (VATS)/WEDGE RESECTION Left 11/26/2018   Procedure: LEFT VIDEO ASSISTED THORACOSCOPY (VATS)/WEDGE RESECTION;  Surgeon: Melrose Nakayama, MD;  Location: [place];  Service: Thoracic;  Laterality: Left;        Home Medications    Prior to Admission medications   Medication Sig Start Date End Date Taking? Authorizing Provider  amLODipine (NORVASC) 5 MG tablet TAKE 1 TABLET BY MOUTH ONCE DAILY. 01/23/19  Yes Herminio Commons, MD  aspirin EC 81 MG tablet Take 81 mg by mouth every other day.    Yes [provider]  atorvastatin (LIPITOR) 40 MG tablet TAKE (1) TABLET BY MOUTH ONCE DAILY. Patient taking differently: Take 40 mg by mouth every evening.  07/26/18  Yes Branch, Alphonse Guild, MD  dexamethasone (DECADRON) 4 MG tablet Take 4 mg by mouth every 8 (eight) hours. Take one tablet by mouth every 8 hours for 21 days   Yes [provider]  furosemide  (LASIX) 40 MG tablet Take 1 tablet (40 mg total) by mouth daily. 11/20/18 11/15/19 Yes Strader, Fransisco Hertz, PA-C  HYDROcodone-acetaminophen (NORCO/VICODIN) 5-325 MG tablet Take 1-2 tablets by mouth 4 (four) times daily as needed for moderate pain.   Yes [provider]  metoprolol tartrate (LOPRESSOR) 25 MG tablet TAKE (1/2) TABLET BY MOUTH 2 TIMES A DAY. 02/26/19  Yes Herminio Commons, MD  morphine (MS CONTIN) 15 MG 12 hr tablet Take 15 mg by mouth every 12 (twelve) hours.   Yes [provider]  potassium chloride SA (K-DUR,KLOR-CON) 20 MEQ tablet Take 1 tablet (20 mEq total) by mouth daily. 11/20/18 11/20/19 Yes Strader, Fransisco Hertz, PA-C    Family History Family History  Problem Relation Age of Onset  . Cancer Mother   . Hypertension Father   . Cancer Father     Social History Social History   Tobacco Use  . Smoking status: Former Smoker    Packs/day: 2.00    Years: 20.00    Pack years: 40.00  Types: Cigarettes    Start date: 10/24/1955    Last attempt to quit: 01/24/1981    Years since quitting: 38.1  . Smokeless tobacco: Never Used  Substance Use Topics  . Alcohol use: No    Alcohol/week: 0.0 standard drinks  . Drug use: No     Allergies   Patient has no known allergies.   Review of Systems Review of Systems  Unable to perform ROS: Mental status change     Physical Exam Updated Vital Signs BP 135/72   Pulse 86   Temp 98 F (36.7 C) (Oral)   Resp 19   Ht '5\' 11"'$  (1.803 m)   Wt 83.5 kg   SpO2 94%   BMI 25.67 kg/m   Physical Exam Vitals signs and nursing note reviewed.  Constitutional:      Appearance: He is well-developed. He is ill-appearing.  HENT:     Head: Normocephalic and atraumatic.     Mouth/Throat:     Mouth: Mucous membranes are dry.  Eyes:     Conjunctiva/sclera: Conjunctivae normal.     Pupils: Pupils are equal, round, and reactive to light.  Neck:     Musculoskeletal: Neck supple.  Cardiovascular:     Rate and  Rhythm: Normal rate and regular rhythm.     Heart sounds: Normal heart sounds. No murmur.  Pulmonary:     Effort: Pulmonary effort is normal. No respiratory distress.     Breath sounds: Wheezing (rare, RLL) present.  Abdominal:     General: Bowel sounds are normal.     Palpations: Abdomen is soft.     Tenderness: There is no abdominal tenderness.  Musculoskeletal:        General: No tenderness.     Right lower leg: No edema.     Left lower leg: No edema.  Skin:    General: Skin is warm and dry.  Neurological:     Mental Status: He is alert.     Comments: Alert but confused. No facial droop. Speech is clear. Unable to answer questions appropriately. Unable to follow most commands. Unable to lift and hold RUE and RLE off bed against gravity. LUE and LLE strength normal.       ED Treatments / Results  Labs (all labs ordered are listed, but only abnormal results are displayed) Labs Reviewed  TROPONIN I - Abnormal; Notable for the following components:      Result Value   Troponin I 0.03 (*)    All other components within normal limits  CBC WITH DIFFERENTIAL/PLATELET - Abnormal; Notable for the following components:   WBC 20.0 (*)    Neutro Abs 18.4 (*)    Lymphs Abs 0.1 (*)    Abs Immature Granulocytes 0.33 (*)    All other components within normal limits  COMPREHENSIVE METABOLIC PANEL - Abnormal; Notable for the following components:   Potassium 6.5 (*)    CO2 19 (*)    Glucose, Bld 133 (*)    BUN 91 (*)    Creatinine, Ser 1.89 (*)    Albumin 3.0 (*)    AST 48 (*)    Alkaline Phosphatase 241 (*)    GFR calc non Af Amer 33 (*)    GFR calc Af Amer 38 (*)    All other components within normal limits  LACTIC ACID, PLASMA - Abnormal; Notable for the following components:   Lactic Acid, Venous 2.8 (*)    All other components within normal limits  CBG MONITORING,  ED - Abnormal; Notable for the following components:   Glucose-Capillary 112 (*)    All other components within  normal limits  I-STAT CHEM 8, ED - Abnormal; Notable for the following components:   Sodium 133 (*)    Potassium 5.9 (*)    BUN 90 (*)    Creatinine, Ser 2.00 (*)    Glucose, Bld 132 (*)    TCO2 19 (*)    All other components within normal limits  CULTURE, BLOOD (ROUTINE X 2)  CULTURE, BLOOD (ROUTINE X 2)  SARS CORONAVIRUS 2 (HOSPITAL ORDER, [place] [facility])  APTT  PROTIME-INR  URINALYSIS, ROUTINE W REFLEX MICROSCOPIC  LACTIC ACID, PLASMA    EKG EKG Interpretation  Date/Time:  Friday Mar 01 2019 14:22:04 EDT Ventricular Rate:  92 PR Interval:    QRS Duration: 128 QT Interval:  369 QTC Calculation: 457 R Axis:   101 Text Interpretation:  Sinus rhythm Right bundle branch block Artifact When compared with ECG of 02/22/2019 No significant change was found Confirmed by Francine Graven [phone]) on 03/01/2019 2:27:49 PM   Radiology Ct Head Wo Contrast  Result Date: 03/01/2019 CLINICAL DATA:  Altered mental status.  History of lung cancer. EXAM: CT HEAD WITHOUT CONTRAST TECHNIQUE: Contiguous axial images were obtained from the base of the skull through the vertex without intravenous contrast. COMPARISON:  MRI of Feb 22, 2019. FINDINGS: Brain: Mild chronic ischemic white matter disease is noted. No mass effect or midline shift is noted. Ventricular size is within normal limits. There is no evidence of mass lesion, hemorrhage or acute infarction. Vascular: No hyperdense vessel or unexpected calcification. Skull: Normal. Negative for fracture or focal lesion. Sinuses/Orbits: Small right maxillary mucous retention cyst is noted. No acute abnormality seen in the paranasal sinuses. Other: None. IMPRESSION: Mild chronic ischemic white matter disease. No acute intracranial abnormality seen. Electronically Signed   By: Marijo Conception M.D.   On: 03/01/2019 16:17   Dg Chest Portable 1 View  Result Date: 03/01/2019 CLINICAL DATA:  Altered mental status EXAM: PORTABLE CHEST 1  VIEW COMPARISON:  09/29/2018 FINDINGS: The heart size and mediastinal contours are within normal limits. Both lungs are clear. The visualized skeletal structures are unremarkable. IMPRESSION: No active disease. Electronically Signed   By: Kathreen Devoid   On: 03/01/2019 14:53    Procedures Procedures (including critical care time) CRITICAL CARE Performed by: Rodney Booze   Total critical care time: 36 minutes  Critical care time was exclusive of separately billable procedures and treating other patients.  Critical care was necessary to treat or prevent imminent or life-threatening deterioration.  Critical care was time spent personally by me on the following activities: development of treatment plan with patient and/or surrogate as well as nursing, discussions with consultants, evaluation of patient's response to treatment, examination of patient, obtaining history from patient or surrogate, ordering and performing treatments and interventions, ordering and review of laboratory studies, ordering and review of radiographic studies, pulse oximetry and re-evaluation of patient's condition.   Medications Ordered in ED Medications  sodium bicarbonate injection 50 mEq (50 mEq Intravenous Given 03/01/19 1625)  insulin aspart (novoLOG) injection 5 Units (5 Units Intravenous Given 03/01/19 1631)  dextrose 50 % solution 50 mL (50 mLs Intravenous Given 03/01/19 1627)  calcium gluconate 1 g/ 50 mL sodium chloride IVPB (0 mg Intravenous Stopped 03/01/19 1628)     Initial Impression / Assessment and Plan / ED Course  I have reviewed the triage vital  signs and the nursing notes.  Pertinent labs & imaging results that were available during my care of the patient were reviewed by me and considered in my medical decision making (see chart for details).     Final Clinical Impressions(s) / ED Diagnoses   Final diagnoses:  Altered mental status, unspecified altered mental status type  Hyperkalemia   Uremia   78 y/o male with lung adenocarcinoma and colon CA with mets to spine. One week ago presenting to ED for eval and found to have multiple bilat strokes. There was concern for mets vs embolic cause and he was recommended for admission for stroke w/u and TEE but refused admission and left AMA. Presents today with worsening confusion and lethargy.   Labs reviewed and revealed: CBC with leukocytosis of 20, worsened from prior CMP with hyperkalemia at 6.5 with elevated BUN/CR 91/1.89. Slightly elevated liver enzymes and elevated alk phos.   -- meds ordered for hyperkalemia Troponin marginally elevated Lactic acid elevated at 2.8 Coags wnl COVID negative  EKG  With NSR, RBBB, and peaked twaves.   CXR negative  CT of the head without acute findings.   3:20 PM Discussed case with the patient's wife who states she is his POA. I discussed patients uncondition and their understanding of it. Discussed code status. She states that pt would not want to be intubated but she would like [place] to do complete a resuscitation "one time" if that patients heart stopped, but would not want further interventions if this resuscitation attempt was not successful. She states she would be comfortable with staff completing compressions, performing defibrillation/cardioversion, and giving resuscitation medications. She is not sure if she and the family are ready to transition the patient to comfort care. She would like time to make decision.  Dr. Sedonia Small also  discussed patients status and prognosis with family. They again would like to defer a decision until they can consult with the rest of the family. They will contact Dr. Sedonia Small after a decision is made. dr Sedonia Small   Care transitioned to Dr. Sedonia Small at shift change who will determine appropriate disposition decision.   ED Discharge Orders    None       Rodney Booze, PA-C 03/01/19 1719    Rodney Booze, PA-C 03/01/19 1722    Maudie Flakes, MD  03/05/19 [phone]

## 2019-03-01 NOTE — Progress Notes (Signed)
Consult request has been received. CSW attempting to follow up at present time  Honor Frison M. Jaylani Mcguinn LCSWA Transitions of Care  Clinical Social Worker  Ph: [phone] 

## 2019-03-01 NOTE — ED Notes (Signed)
Date and time results received: 03/01/19 1557 (use smartphrase ".now" to insert current time)  Test: lactic Acid Critical Value: 2.8  Name of Provider Notified: Courtni PA  Orders Received? Or Actions Taken?: Orders Received - See Orders for details

## 2019-03-01 NOTE — ED Notes (Addendum)
Pt was transported to [facility] next to hospital for treatment to met site to his back  Pt is on long acting morphine  Upon arrival to ca center, they felt him to be lethargic  Sent to [facility] for evaluation

## 2019-03-01 NOTE — ED Provider Notes (Signed)
Event note 4:05 PM: I personally had a goals of care discussion with the patient's family the telephone.  Discussed patient's age, likely stage IV cancer, multiple recent strokes, and his overall poor prognosis.  Explained the more recent issues today, namely the acute renal failure.  Family is all in agreement that patient is not interested in dialysis.  Family does express some interest in comfort care and bringing the patient home to pass away naturally.  Family requests that we continue with medical interventions until they can all talk as a unit and come to this decision together.  Expecting a call back soon.  6:21 PM update: Received callback from patient's family, again had a long discussion about prognosis, what constitutes comfort care.  Family is in agreement to transition to full comfort care, they wish to bring the patient home.  Family is fully aware of the likely decompensation and death of the patient in the near future without intervention, they are confident that this is the patient's wishes.  Patient has been fully transition to comfort care, monitoring and medical intervention is now discontinued, case management consulted to help facilitate home hospice.  8:43 PM update: We are thankful for the services of case management, who are able to set up home hospice services starting first thing tomorrow morning.  Family feels comfortable caring for the patient on their own until then.  We will provide transfer home via EMS.  Critical Care Documentation Critical care time provided by me (excluding procedures): 45 minutes  Condition necessitating critical care: Acute renal failure, end-of-life care discussions  Components of critical care management: reviewing of prior records, laboratory and imaging interpretation, frequent re-examination and reassessment of vital signs, administration of IV calcium, IV insulin, IV dextrose, IV bicarbonate, multiple extended conversations regarding goals of  care and transition to comfort care.     Maudie Flakes, MD 03/01/19 2045

## 2019-03-01 NOTE — Progress Notes (Signed)
CSW in contact with [facility]. CSW spoke with staff regarding referral. CSW awaiting call back from Hospice after hours to confirm referral and to advise family regarding weekend care for pt.   CSW not expecting Hospice services to begin until Monday. CSW awaiting to hear back from [place] services regarding possible weekend services.   [place] Transitions of Care  Clinical Social Worker  Ph: [phone]

## 2019-03-01 NOTE — ED Notes (Signed)
CRITICAL VALUE ALERT  Critical Value:  Lactic 3.5  Date & Time Notied:  03/01/2019, 1820  Provider Notified: Dr. Sedonia Small  Orders Received/Actions taken: see chart

## 2019-03-01 NOTE — Progress Notes (Signed)
CSW spoke with [facility] staff, Ph: [phone] to discuss hospice care for pt. Staff confirmed that Hospice would be out as early as tomorrow morning 03/02/2019 to intiatiate care. Staff went into detail and explained that she also spoke with the family to educate them of upcoming hospice services and that they would be ordering a hospital bed for pt.   EDP is aware. Pt is to discharge home via EMS.   [place] Transitions of Care  Clinical Social Worker  Ph: [phone]

## 2019-03-01 NOTE — ED Notes (Signed)
CRITICAL VALUE ALERT  Critical Value:  Troponin 0.03, K 6.5  Date & Time Notied:  03/01/2019, 1535  Provider Notified: Dr. Sedonia Small  Orders Received/Actions taken: see chart

## 2019-03-01 NOTE — ED Notes (Signed)
PA in to assess 

## 2019-03-01 NOTE — ED Triage Notes (Signed)
Pt was at Cancer center in [place]  Was to be transported home and EMS was directed to bring to [place] [facility] by the cancer center staff  Here for evaluation of weakness   Pt has lung cancer

## 2019-03-01 NOTE — ED Notes (Signed)
Patient made aware a urine sample is needed.

## 2019-03-01 NOTE — Discharge Instructions (Addendum)
After careful consideration and multiple discussions, we will be moving forward with full comfort care and home hospice.  We have involved our case management team, who was able to set up home hospice care services starting tomorrow morning.

## 2019-03-01 NOTE — ED Notes (Signed)
Covid opbained with difficulty  Explained to pt that to be admitted, he would need covid test, would go into nares , that it would be uncomfortable for a short time.   Pt replied that noone could tell him what he had to have Further explained test to pt and asked that he remain still while collected. Upon putting swab in nares, pt flailed head L to R and pulled swab from nose.  Spec to lab as this was second attempt and pt increasingly less cooperative

## 2019-03-01 NOTE — ED Notes (Signed)
Patient given ice chips per RN approval.

## 2019-03-01 NOTE — Progress Notes (Signed)
2nd shift ED CSW in contact with Pt's spouse, Steve [name], Ph: [phone]. CSW updated Becky on progress of referral. Beck expressed understanding and is in agreement that pt is to discharge with services from [facility]. Becky expressed that she expects for services not to begin until Monday but would like to updated if services can begin over the weekend.   CSW will continue to follow up regarding this matter  [place] Transitions of Care  Clinical Social Worker  Ph: [phone]

## 2019-03-02 DIAGNOSIS — C3490 Malignant neoplasm of unspecified part of unspecified bronchus or lung: Secondary | ICD-10-CM | POA: Diagnosis not present

## 2019-03-02 DIAGNOSIS — R531 Weakness: Secondary | ICD-10-CM | POA: Diagnosis not present

## 2019-03-02 DIAGNOSIS — R630 Anorexia: Secondary | ICD-10-CM | POA: Diagnosis not present

## 2019-03-02 DIAGNOSIS — I6389 Other cerebral infarction: Secondary | ICD-10-CM | POA: Diagnosis not present

## 2019-03-02 DIAGNOSIS — R52 Pain, unspecified: Secondary | ICD-10-CM | POA: Diagnosis not present

## 2019-03-04 DIAGNOSIS — C3490 Malignant neoplasm of unspecified part of unspecified bronchus or lung: Secondary | ICD-10-CM | POA: Diagnosis not present

## 2019-03-04 DIAGNOSIS — R531 Weakness: Secondary | ICD-10-CM | POA: Diagnosis not present

## 2019-03-04 DIAGNOSIS — I6389 Other cerebral infarction: Secondary | ICD-10-CM | POA: Diagnosis not present

## 2019-03-04 DIAGNOSIS — R52 Pain, unspecified: Secondary | ICD-10-CM | POA: Diagnosis not present

## 2019-03-04 DIAGNOSIS — R630 Anorexia: Secondary | ICD-10-CM | POA: Diagnosis not present

## 2019-03-06 LAB — CULTURE, BLOOD (ROUTINE X 2)
Culture: NO GROWTH
Culture: NO GROWTH
Special Requests: ADEQUATE

## 2019-03-13 ENCOUNTER — Ambulatory Visit (HOSPITAL_COMMUNITY): Payer: Medicare Other

## 2019-03-14 ENCOUNTER — Ambulatory Visit (HOSPITAL_COMMUNITY): Payer: Medicare Other | Admitting: Hematology

## 2019-04-03 DEATH — deceased

## 2019-04-30 ENCOUNTER — Ambulatory Visit: Payer: Medicare Other | Admitting: Thoracic Surgery (Cardiothoracic Vascular Surgery)

## 2019-07-18 ENCOUNTER — Other Ambulatory Visit: Payer: Self-pay | Admitting: Cardiovascular Disease

## 2019-07-29 IMAGING — CR DG CHEST 2V
2 series · 2 of 2 positions shown · non-contrast
Comparison: Chest CT dated 11/16/2018

CLINICAL DATA: 78-year-old male with left upper lobe nodule.

EXAM:
CHEST - 2 VIEW

[w chest pa]
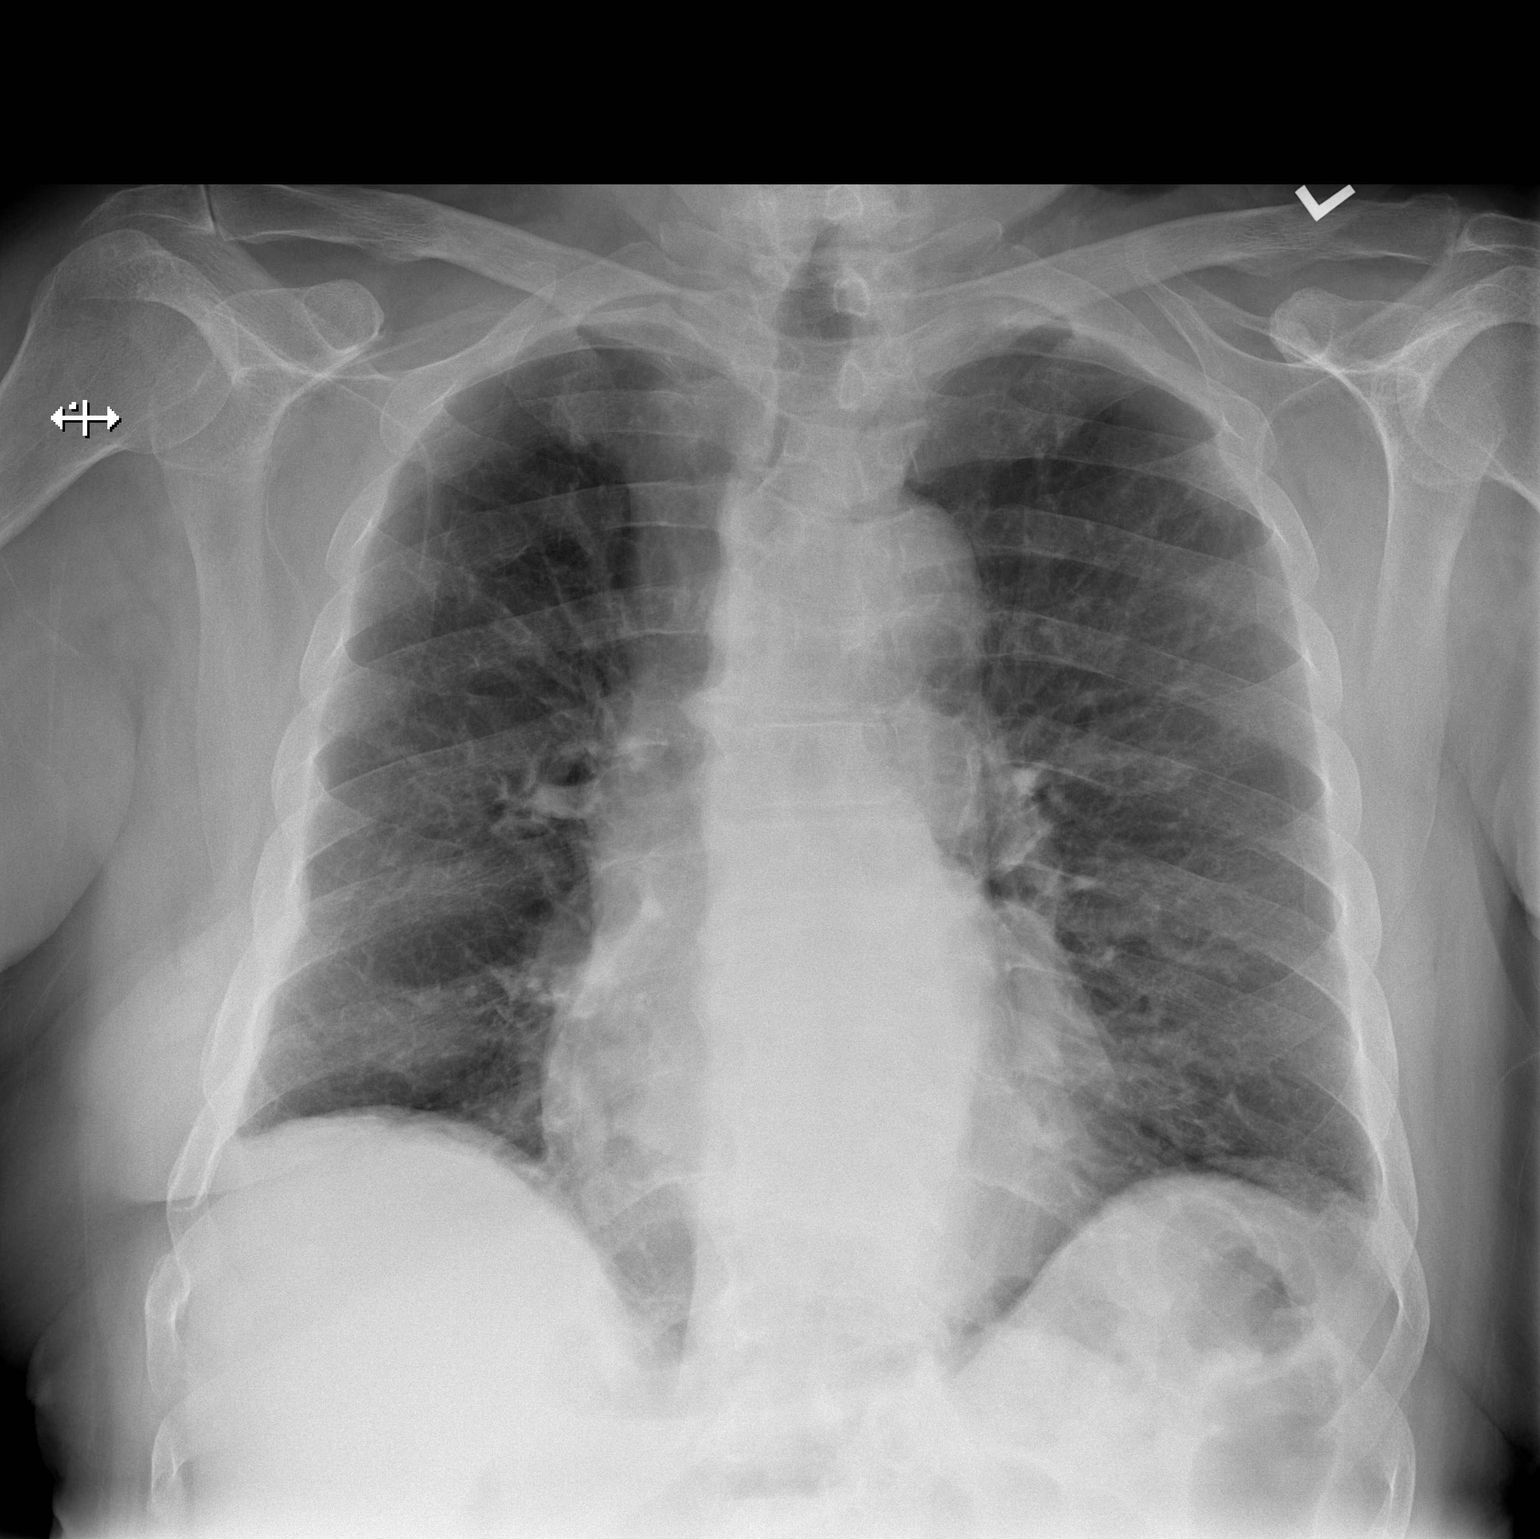

[w chest lat]
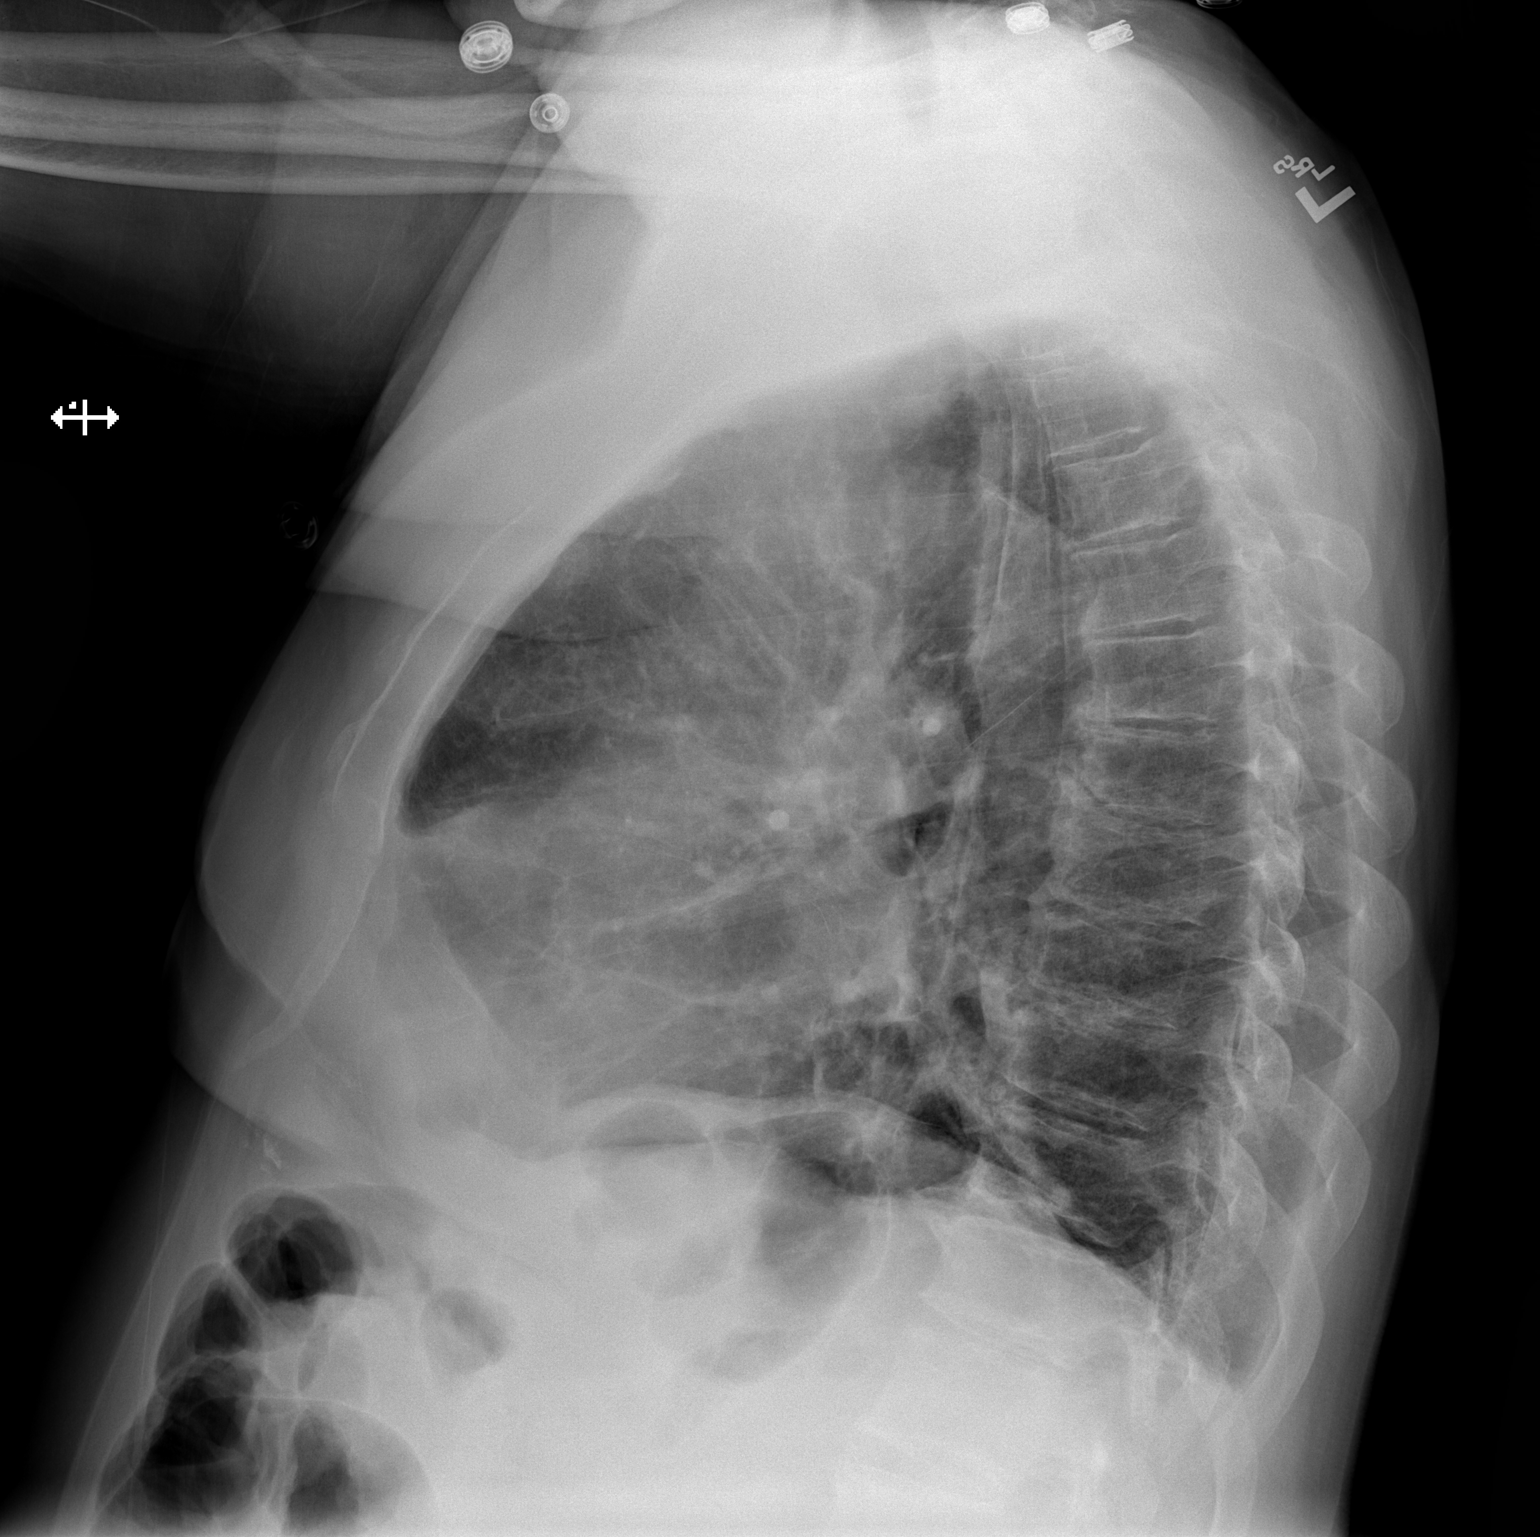

[2 of 2 positions shown; findings below may reference images not displayed]

FINDINGS: There is diffuse interstitial coarsening. No focal consolidation,
pleural effusion, or pneumothorax. The previously seen left upper
lobe nodule is suboptimally visualized on this radiograph. A 12 mm
focus of hazy density in the left upper lung may correspond to the
previously seen nodule. The cardiac silhouette is within normal
limits. No acute osseous pathology.
IMPRESSION: No active cardiopulmonary disease.

## 2019-07-31 IMAGING — DX DG CHEST 1V PORT
1 series · 1 of 1 positions shown · non-contrast
Comparison: 11/27/2017; 11/26/2017; 10/18/2018; chest CT-11/16/2018

CLINICAL DATA: History of pneumothorax, post chest tube placement.

EXAM:
PORTABLE CHEST 1 VIEW

[chest ap]
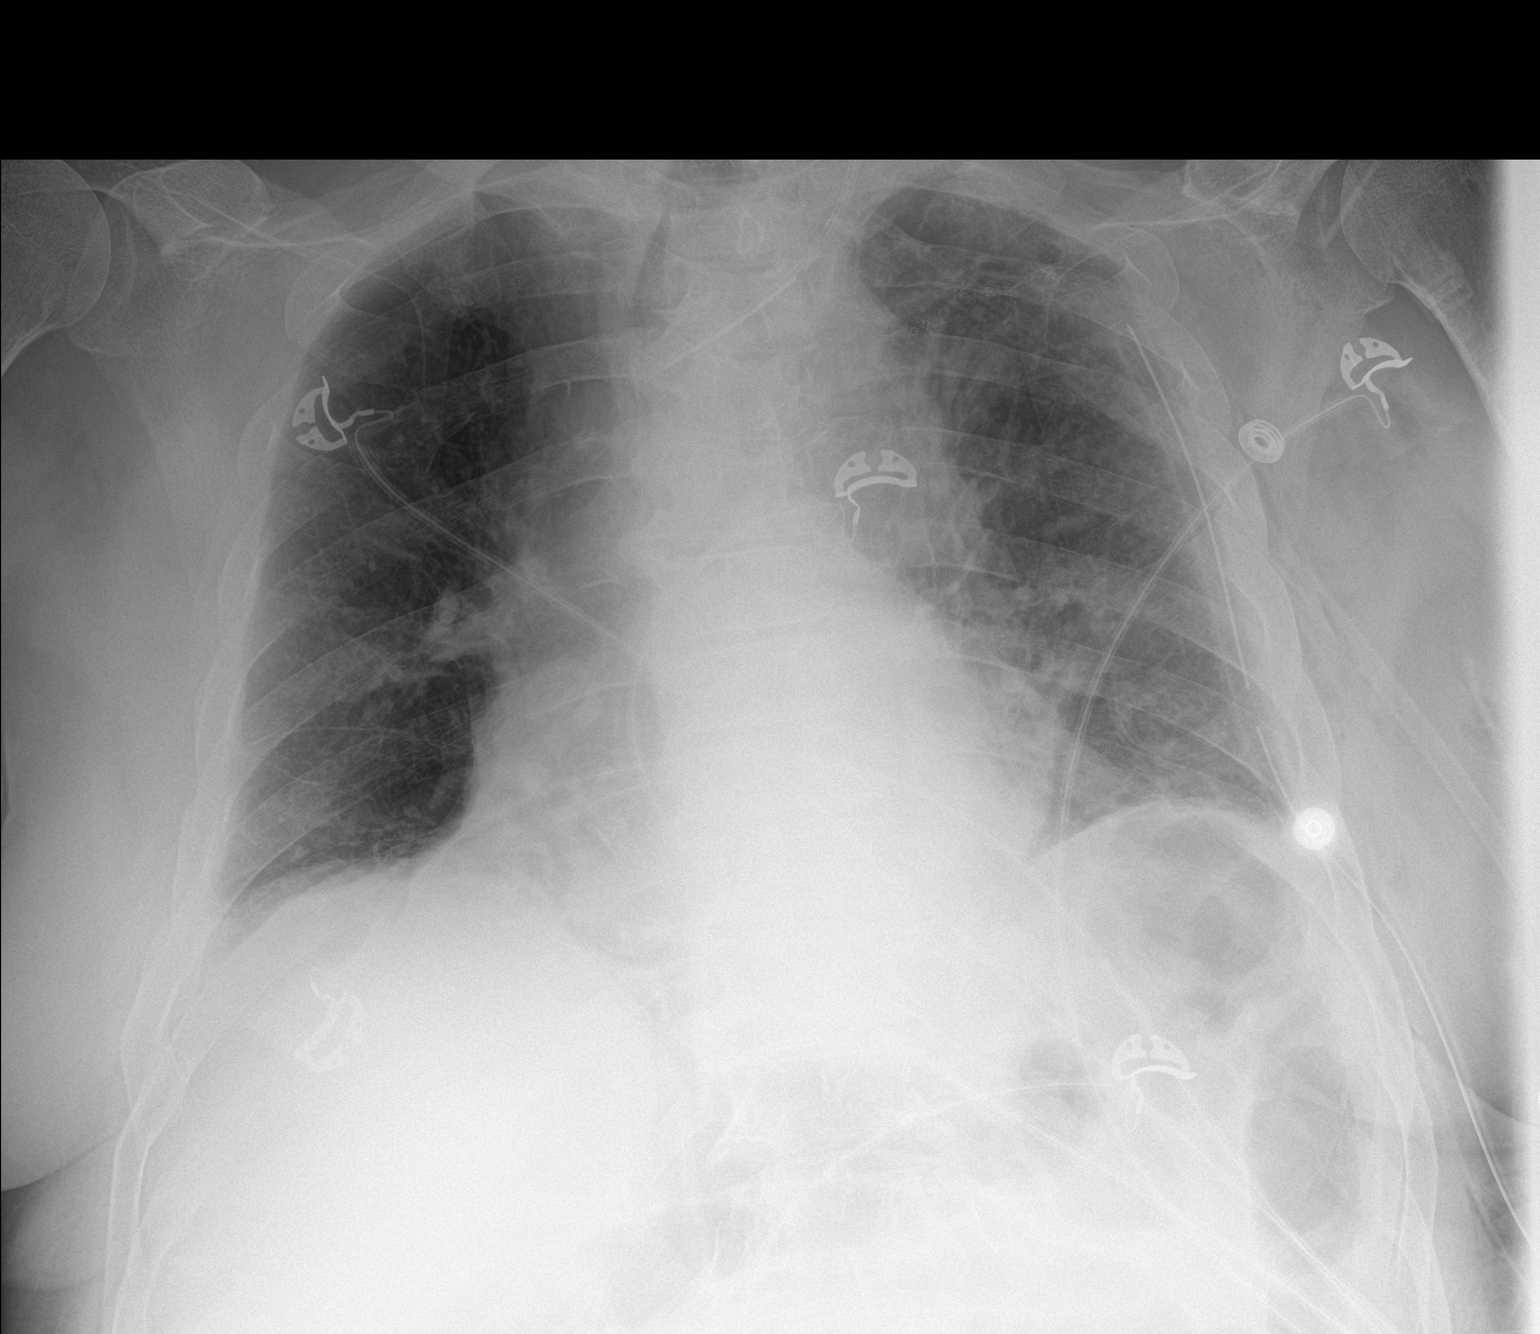

[1 of 1 positions shown; findings below may reference images not displayed]

FINDINGS: Grossly unchanged enlarged cardiac silhouette and mediastinal
contours. Left jugular approach central venous catheter tip overlies
the central aspect of the right brachiocephalic vein. Stable
positioning of left-sided chest tube. No pneumothorax.

Stable postsurgical change of the left upper lung. Overall improved
aeration of lungs with persistent minimal residual left-sided
interstitial opacities. No new focal airspace opacities. No definite
pleural effusion. No evidence of edema. No acute osseus
abnormalities. Old right-sided posterior rib fractures.
IMPRESSION: 1.  Stable positioning of support apparatus.  No pneumothorax.
2. Improved aeration of the lungs without superimposed acute
cardiopulmonary disease.

## 2019-08-01 IMAGING — DX DG CHEST 1V PORT
1 series · 1 of 1 positions shown · non-contrast
Comparison: 11/28/2018 and prior radiographs

CLINICAL DATA: Status post LEFT thoracic surgery/lobectomy..

EXAM:
PORTABLE CHEST 1 VIEW

[chest ap]
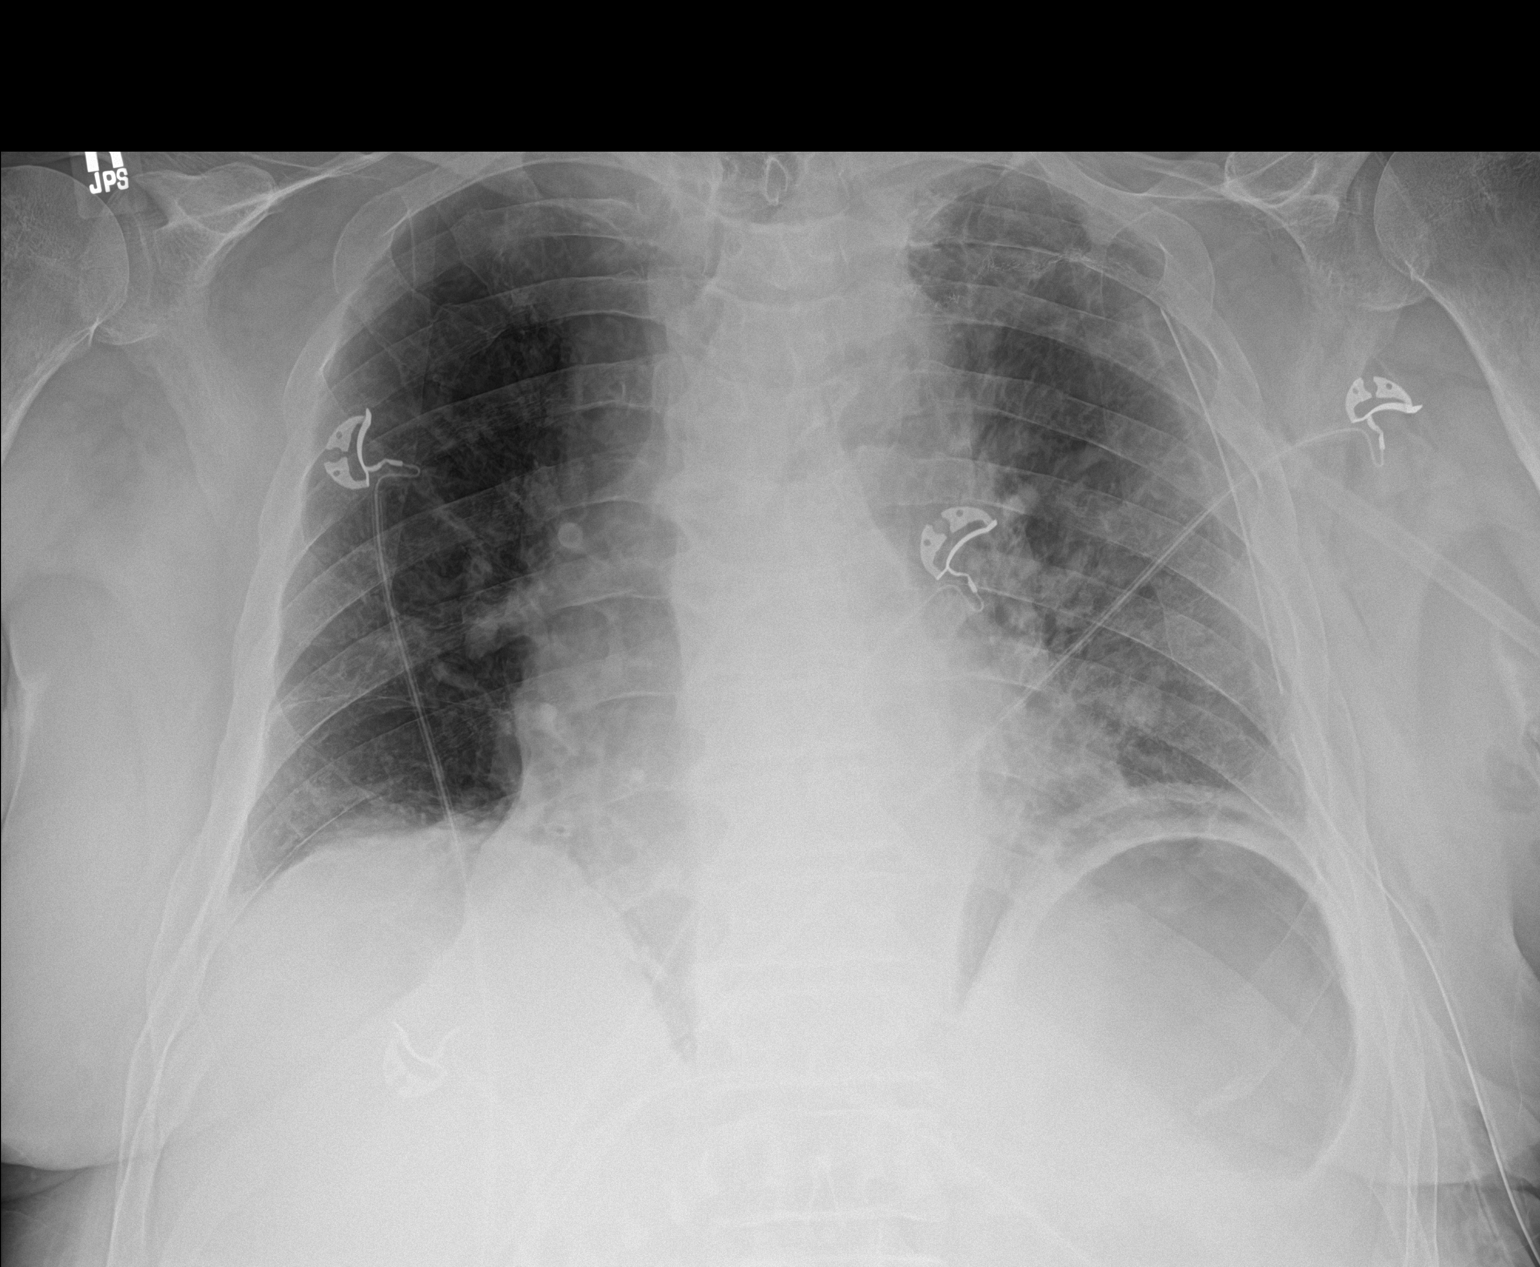

[1 of 1 positions shown; findings below may reference images not displayed]

FINDINGS: Cardiomediastinal silhouette is unchanged.

LEFT IJ central venous catheter has been removed.

LEFT thoracostomy tube again identified without pneumothorax.

Pulmonary vascular congestion and mild bibasilar atelectasis again
noted.
IMPRESSION: LEFT central venous catheter removal without other significant
change.
